# Patient Record
Sex: Female | Born: 1945 | Race: White | Hispanic: No | Smoking: Never smoker
Health system: Southern US, Community
[De-identification: ages and names within clinical notes are randomized; demographics above are authoritative.]

## PROBLEM LIST (undated history)

## (undated) DIAGNOSIS — E559 Vitamin D deficiency, unspecified: Secondary | ICD-10-CM

## (undated) DIAGNOSIS — K210 Gastro-esophageal reflux disease with esophagitis: Secondary | ICD-10-CM

## (undated) DIAGNOSIS — I471 Supraventricular tachycardia: Secondary | ICD-10-CM

## (undated) DIAGNOSIS — I10 Essential (primary) hypertension: Secondary | ICD-10-CM

## (undated) DIAGNOSIS — R7303 Prediabetes: Secondary | ICD-10-CM

## (undated) DIAGNOSIS — G43909 Migraine, unspecified, not intractable, without status migrainosus: Secondary | ICD-10-CM

## (undated) DIAGNOSIS — G518 Other disorders of facial nerve: Secondary | ICD-10-CM

## (undated) DIAGNOSIS — E782 Mixed hyperlipidemia: Secondary | ICD-10-CM

## (undated) HISTORY — DX: Mixed hyperlipidemia: E78.2

## (undated) HISTORY — DX: Migraine, unspecified, not intractable, without status migrainosus: G43.909

## (undated) HISTORY — DX: Gastro-esophageal reflux disease with esophagitis: K21.0

## (undated) HISTORY — DX: Other disorders of facial nerve: G51.8

## (undated) HISTORY — DX: Supraventricular tachycardia: I47.1

## (undated) HISTORY — DX: Vitamin D deficiency, unspecified: E55.9

## (undated) HISTORY — DX: Prediabetes: R73.03

## (undated) HISTORY — DX: Essential (primary) hypertension: I10

## (undated) HISTORY — PX: NO PAST SURGERIES: SHX2092

## (undated) HISTORY — DX: Morbid (severe) obesity due to excess calories: E66.01

---

## 2000-02-26 ENCOUNTER — Other Ambulatory Visit: Admission: RE | Admit: 2000-02-26 | Discharge: 2000-02-26 | Payer: Self-pay | Admitting: Obstetrics and Gynecology

## 2001-02-13 ENCOUNTER — Other Ambulatory Visit: Admission: RE | Admit: 2001-02-13 | Discharge: 2001-02-13 | Payer: Self-pay | Admitting: Obstetrics and Gynecology

## 2001-07-18 ENCOUNTER — Other Ambulatory Visit: Admission: RE | Admit: 2001-07-18 | Discharge: 2001-07-18 | Payer: Self-pay | Admitting: Gastroenterology

## 2001-07-18 ENCOUNTER — Encounter (INDEPENDENT_AMBULATORY_CARE_PROVIDER_SITE_OTHER): Payer: Self-pay | Admitting: Specialist

## 2002-04-08 ENCOUNTER — Other Ambulatory Visit: Admission: RE | Admit: 2002-04-08 | Discharge: 2002-04-08 | Payer: Self-pay | Admitting: Obstetrics and Gynecology

## 2003-05-21 ENCOUNTER — Other Ambulatory Visit: Admission: RE | Admit: 2003-05-21 | Discharge: 2003-05-21 | Payer: Self-pay | Admitting: Obstetrics and Gynecology

## 2004-06-29 ENCOUNTER — Other Ambulatory Visit: Admission: RE | Admit: 2004-06-29 | Discharge: 2004-06-29 | Payer: Self-pay | Admitting: Obstetrics and Gynecology

## 2005-07-05 ENCOUNTER — Other Ambulatory Visit: Admission: RE | Admit: 2005-07-05 | Discharge: 2005-07-05 | Payer: Self-pay | Admitting: Obstetrics and Gynecology

## 2006-07-10 ENCOUNTER — Other Ambulatory Visit: Admission: RE | Admit: 2006-07-10 | Discharge: 2006-07-10 | Payer: Self-pay | Admitting: Obstetrics and Gynecology

## 2007-05-30 ENCOUNTER — Ambulatory Visit: Payer: Self-pay | Admitting: Gastroenterology

## 2007-06-13 ENCOUNTER — Ambulatory Visit: Payer: Self-pay | Admitting: Gastroenterology

## 2009-08-30 ENCOUNTER — Encounter: Admission: RE | Admit: 2009-08-30 | Discharge: 2009-08-30 | Payer: Self-pay | Admitting: Obstetrics and Gynecology

## 2010-01-24 ENCOUNTER — Ambulatory Visit: Admission: RE | Admit: 2010-01-24 | Discharge: 2010-01-24 | Payer: Self-pay | Admitting: Internal Medicine

## 2010-06-23 ENCOUNTER — Encounter: Admission: RE | Admit: 2010-06-23 | Discharge: 2010-06-23 | Payer: Self-pay | Admitting: Internal Medicine

## 2010-08-18 ENCOUNTER — Encounter: Admission: RE | Admit: 2010-08-18 | Discharge: 2010-08-18 | Payer: Self-pay | Admitting: Obstetrics and Gynecology

## 2011-02-25 ENCOUNTER — Emergency Department (HOSPITAL_COMMUNITY)
Admission: EM | Admit: 2011-02-25 | Discharge: 2011-02-25 | Disposition: A | Discharge status: Home / Self Care | Payer: BC Managed Care – PPO | Attending: Emergency Medicine | Admitting: Emergency Medicine

## 2011-02-25 DIAGNOSIS — I498 Other specified cardiac arrhythmias: Secondary | ICD-10-CM | POA: Insufficient documentation

## 2011-02-25 DIAGNOSIS — Z79899 Other long term (current) drug therapy: Secondary | ICD-10-CM | POA: Insufficient documentation

## 2011-02-25 DIAGNOSIS — R002 Palpitations: Secondary | ICD-10-CM | POA: Insufficient documentation

## 2011-06-07 ENCOUNTER — Other Ambulatory Visit: Payer: Self-pay | Admitting: Internal Medicine

## 2011-06-07 DIAGNOSIS — Z1231 Encounter for screening mammogram for malignant neoplasm of breast: Secondary | ICD-10-CM

## 2011-07-05 ENCOUNTER — Ambulatory Visit: Payer: BC Managed Care – PPO

## 2011-07-10 ENCOUNTER — Ambulatory Visit
Admission: RE | Admit: 2011-07-10 | Discharge: 2011-07-10 | Disposition: A | Discharge status: Home / Self Care | Payer: Medicare Other | Source: Ambulatory Visit | Attending: Internal Medicine | Admitting: Internal Medicine

## 2011-07-10 DIAGNOSIS — Z1231 Encounter for screening mammogram for malignant neoplasm of breast: Secondary | ICD-10-CM

## 2012-09-25 ENCOUNTER — Other Ambulatory Visit: Payer: Self-pay | Admitting: Internal Medicine

## 2012-09-25 DIAGNOSIS — Z1231 Encounter for screening mammogram for malignant neoplasm of breast: Secondary | ICD-10-CM

## 2012-11-07 ENCOUNTER — Ambulatory Visit
Admission: RE | Admit: 2012-11-07 | Discharge: 2012-11-07 | Disposition: A | Discharge status: Home / Self Care | Payer: Medicare Other | Source: Ambulatory Visit | Attending: Internal Medicine | Admitting: Internal Medicine

## 2012-11-07 DIAGNOSIS — Z1231 Encounter for screening mammogram for malignant neoplasm of breast: Secondary | ICD-10-CM

## 2013-10-17 ENCOUNTER — Encounter: Payer: Self-pay | Admitting: Internal Medicine

## 2013-10-20 ENCOUNTER — Ambulatory Visit (INDEPENDENT_AMBULATORY_CARE_PROVIDER_SITE_OTHER): Payer: Medicare Other | Admitting: Internal Medicine

## 2013-10-20 ENCOUNTER — Encounter: Payer: Self-pay | Admitting: Internal Medicine

## 2013-10-20 VITALS — BP 134/80 | HR 64 | Temp 98.2°F | Resp 18 | Ht 61.0 in | Wt 148.2 lb

## 2013-10-20 DIAGNOSIS — G518 Other disorders of facial nerve: Secondary | ICD-10-CM

## 2013-10-20 DIAGNOSIS — I10 Essential (primary) hypertension: Secondary | ICD-10-CM

## 2013-10-20 DIAGNOSIS — G43909 Migraine, unspecified, not intractable, without status migrainosus: Secondary | ICD-10-CM

## 2013-10-20 DIAGNOSIS — E559 Vitamin D deficiency, unspecified: Secondary | ICD-10-CM

## 2013-10-20 DIAGNOSIS — K2100 Gastro-esophageal reflux disease with esophagitis, without bleeding: Secondary | ICD-10-CM

## 2013-10-20 DIAGNOSIS — E782 Mixed hyperlipidemia: Secondary | ICD-10-CM

## 2013-10-20 DIAGNOSIS — R7303 Prediabetes: Secondary | ICD-10-CM

## 2013-10-20 DIAGNOSIS — R7309 Other abnormal glucose: Secondary | ICD-10-CM

## 2013-10-20 DIAGNOSIS — Z79899 Other long term (current) drug therapy: Secondary | ICD-10-CM

## 2013-10-20 HISTORY — DX: Mixed hyperlipidemia: E78.2

## 2013-10-20 HISTORY — DX: Prediabetes: R73.03

## 2013-10-20 HISTORY — DX: Migraine, unspecified, not intractable, without status migrainosus: G43.909

## 2013-10-20 HISTORY — DX: Essential (primary) hypertension: I10

## 2013-10-20 HISTORY — DX: Gastro-esophageal reflux disease with esophagitis, without bleeding: K21.00

## 2013-10-20 HISTORY — DX: Other disorders of facial nerve: G51.8

## 2013-10-20 HISTORY — DX: Vitamin D deficiency, unspecified: E55.9

## 2013-10-20 LAB — HEPATIC FUNCTION PANEL
AST: 26 U/L (ref 0–37)
Albumin: 4.5 g/dL (ref 3.5–5.2)
Alkaline Phosphatase: 93 U/L (ref 39–117)
Bilirubin, Direct: 0.1 mg/dL (ref 0.0–0.3)
Total Bilirubin: 0.3 mg/dL (ref 0.3–1.2)

## 2013-10-20 LAB — CBC WITH DIFFERENTIAL/PLATELET
Basophils Absolute: 0.1 10*3/uL (ref 0.0–0.1)
Lymphocytes Relative: 22 % (ref 12–46)
Lymphs Abs: 1.1 10*3/uL (ref 0.7–4.0)
Neutrophils Relative %: 65 % (ref 43–77)
Platelets: 328 10*3/uL (ref 150–400)
RBC: 5.08 MIL/uL (ref 3.87–5.11)
RDW: 13.9 % (ref 11.5–15.5)
WBC: 4.9 10*3/uL (ref 4.0–10.5)

## 2013-10-20 LAB — LIPID PANEL
HDL: 77 mg/dL (ref >39)
Total CHOL/HDL Ratio: 2.6 Ratio
Triglycerides: 86 mg/dL (ref <150)

## 2013-10-20 LAB — BASIC METABOLIC PANEL WITH GFR
CO2: 27 mEq/L (ref 19–32)
Calcium: 10 mg/dL (ref 8.4–10.5)
Chloride: 106 mEq/L (ref 96–112)
Creat: 0.94 mg/dL (ref 0.50–1.10)
GFR, Est Non African American: 63 mL/min
Sodium: 140 mEq/L (ref 135–145)

## 2013-10-20 LAB — TSH: TSH: 1.926 u[IU]/mL (ref 0.350–4.500)

## 2013-10-20 LAB — MAGNESIUM: Magnesium: 2 mg/dL (ref 1.5–2.5)

## 2013-10-20 NOTE — Progress Notes (Signed)
Patient ID: Jean Drake, female   DOB: November 19, 1945, 67 y.o.   MRN: [id number]  This very nice 67 yo MWF presents for 3 month follow up with labile HTN and palpitations, Hyperlipidemia, Pre-Diabetes and Vitamin D Deficiency.    BP has been controlled at home. Today's BP is 134/80. Patient denies any cardiac type chest pain,  dyspnea/orthopnea/PND, dizziness, claudication, or dependent edema, but does have occasional transient fluttering palpitations,.   Hyperlipidemia is controlled with diet & meds. Last Cholesterol was 216, Triglycerides were 141, HDL 66 and LDL 122. Patient denies myalgias or other med SE's.    Also, the patient has history of PreDiabetes/insulin resistance with last A1c of    . Patient denies any symptoms of reactive hypoglycemia, diabetic polys, paresthesias or visual blurring.   Further, Patient has history of Vitamin D Deficiency with last vitamin D of   . Patient supplements vitamin D without any suspected side-effects.    Medication List       ALLERGY MEDICINE PO  Take 180 mg by mouth daily.     ALPRAZolam 0.5 MG tablet  Commonly known as:  XANAX  Take 0.5 mg by mouth. Takes 1/2 tab prn     amitriptyline 10 MG tablet  Commonly known as:  ELAVIL  Take 10 mg by mouth at bedtime.     aspirin 81 MG tablet  Take 81 mg by mouth daily.     atenolol 25 MG tablet  Commonly known as:  TENORMIN  Take 25 mg by mouth daily.     estradiol 0.5 MG tablet  Commonly known as:  ESTRACE  Take 0.5 mg by mouth daily.     FLAXSEED OIL PO  Take 1,400 mg by mouth daily.     MAGNESIUM OXIDE PO  Take 250 mg by mouth daily.     multivitamin tablet  Take 1 tablet by mouth daily.     multivitamin-lutein Caps capsule  Take 1 capsule by mouth daily.     omeprazole 40 MG capsule  Commonly known as:  PRILOSEC  Take 40 mg by mouth daily.     Vitamin D 2000 UNITS tablet  Take 2,000 Units by mouth daily. Takes 3 per day     vitamin E 400 UNIT capsule  Take 400 Units by mouth  daily.           Allergies  Allergen Reactions  . Codeine   . Penicillins   . Sudafed [Pseudoephedrine Hcl]     PMHx:  Significant as above  FHx:    Reviewed / unchanged  SHx:    Reviewed / unchanged  Systems Review: Constitutional: Denies fever, chills, wt changes, headaches, insomnia, fatigue, night sweats, change in appetite. Eyes: Denies redness, blurred vision, diplopia, discharge, itchy, watery eyes.  ENT: Denies discharge, congestion, post nasal drip, epistaxis, sore throat, earache, hearing loss, dental pain, tinnitus, vertigo, sinus pain, snoring.  CV: Denies chest pain, palpitations, irregular heartbeat, syncope, dyspnea, diaphoresis, orthopnea, PND, claudication, edema. Respiratory: denies cough, dyspnea, DOE, pleurisy, hoarseness, laryngitis, wheezing.  Gastrointestinal: Denies dysphagia, odynophagia, heartburn, reflux, water brash, abdominal pain or cramps, nausea, vomiting, bloating, diarrhea, constipation, hematemesis, melena, hematochezia,  or hemorrhoids. Genitourinary: Denies dysuria, frequency, urgency, nocturia, hesitancy, discharge, hematuria, flank pain. Musculoskeletal: Denies arthralgias, myalgias, stiffness, jt. swelling, pain, limp, strain/sprain.  Skin: Denies pruritus, rash, hives, warts, acne, eczema, change in skin lesion(s). Neuro: No weakness, tremor, incoordination, spasms, paresthesia, or pain. Psychiatric: Denies confusion, memory loss, or sensory loss. Endo: Denies change in  weight, skin, hair change.  Heme/Lymph: No excessive bleeding, bruising, orenlarged lymph nodes.  Filed Vitals:   10/20/13 1127  BP: 134/80  Pulse: 64  Temp: 98.2 F (36.8 C)  Resp: 18    Estimated body mass index is 28.02 kg/(m^2) as calculated from the following:   Height as of this encounter: 5\' 1"  (1.549 m).   Weight as of this encounter: 148 lb 3.2 oz (67.223 kg).  On Exam: Appears well nourished - in no distress. Eyes: PERRLA, EOMs, conjunctiva no  swelling or erythema. Sinuses: No frontal/maxillary tenderness ENT/Mouth: EAC's clear, TM's nl w/o erythema, bulging. Nares clear w/o erythema, swelling, exudates. Oropharynx clear without erythema or exudates. Oral hygiene is good. Tongue normal, non obstructing. Hearing intact.  Neck: Supple. Thyroid nl. Car 2+/2+ without bruits, nodes or JVD. Chest: Respirations nl with BS clear & equal w/o rales, rhonchi, wheezing or stridor.  Cor: Heart sounds normal w/ regular rate and rhythm without sig. murmurs, gallops, clicks, or rubs. Peripheral pulses normal and equal  without edema.  Abdomen: Soft & bowel sounds normal. Non-tender w/o guarding, rebound, hernias, masses, or organomegaly.  Lymphatics: Unremarkable.  Musculoskeletal: Full ROM all peripheral extremities, joint stability, 5/5 strength, and normal gait.  Skin: Warm, dry without exposed rashes, lesions, ecchymosis apparent.  Neuro: Cranial nerves intact, reflexes equal bilaterally. Sensory-motor testing grossly intact. Tendon reflexes grossly intact.  Pysch: Alert & oriented x 3. Insight and judgement nl & appropriate. No ideations.  Assessment and Plan:  1. Hypertension - Continue monitor blood pressure at home. Continue diet/meds same.  2. Hyperlipidemia - Continue diet/meds, exercise,& lifestyle modifications. Continue monitor periodic cholesterol/liver & renal functions   3. Pre-diabetes- Continue diet, exercise, lifestyle modifications. Monitor appropriate labs.  4. Vitamin D Deficiency - Continue supplementation.  Recommended regular exercise, BP monitoring, weight control, and discussed med and SE's. Recommended labs to assess and monitor clinical status. Further disposition pending results of labs.

## 2013-10-20 NOTE — Patient Instructions (Signed)
Hypertension As your heart beats, it forces blood through your arteries. This force is your blood pressure. If the pressure is too high, it is called hypertension (HTN) or high blood pressure. HTN is dangerous because you may have it and not know it. High blood pressure may mean that your heart has to work harder to pump blood. Your arteries may be narrow or stiff. The extra work puts you at risk for heart disease, stroke, and other problems.  Blood pressure consists of two numbers, a higher number over a lower, 110/72, for example. It is stated as "110 over 72." The ideal is below 120 for the top number (systolic) and under 80 for the bottom (diastolic). Write down your blood pressure today. You should pay close attention to your blood pressure if you have certain conditions such as:  Heart failure.  Prior heart attack.  Diabetes  Chronic kidney disease.  Prior stroke.  Multiple risk factors for heart disease. To see if you have HTN, your blood pressure should be measured while you are seated with your arm held at the level of the heart. It should be measured at least twice. A one-time elevated blood pressure reading (especially in the Emergency Department) does not mean that you need treatment. There may be conditions in which the blood pressure is different between your right and left arms. It is important to see your caregiver soon for a recheck. Most people have essential hypertension which means that there is not a specific cause. This type of high blood pressure may be lowered by changing lifestyle factors such as:  Stress.  Smoking.  Lack of exercise.  Excessive weight.  Drug/tobacco/alcohol use.  Eating less salt. Most people do not have symptoms from high blood pressure until it has caused damage to the body. Effective treatment can often prevent, delay or reduce that damage. TREATMENT  When a cause has been identified, treatment for high blood pressure is directed at the  cause. There are a large number of medications to treat HTN. These fall into several categories, and your caregiver will help you select the medicines that are best for you. Medications may have side effects. You should review side effects with your caregiver. If your blood pressure stays high after you have made lifestyle changes or started on medicines,   Your medication(s) may need to be changed.  Other problems may need to be addressed.  Be certain you understand your prescriptions, and know how and when to take your medicine.  Be sure to follow up with your caregiver within the time frame advised (usually within two weeks) to have your blood pressure rechecked and to review your medications.  If you are taking more than one medicine to lower your blood pressure, make sure you know how and at what times they should be taken. Taking two medicines at the same time can result in blood pressure that is too low. SEEK IMMEDIATE MEDICAL CARE IF:  You develop a severe headache, blurred or changing vision, or confusion.  You have unusual weakness or numbness, or a faint feeling.  You have severe chest or abdominal pain, vomiting, or breathing problems. MAKE SURE YOU:   Understand these instructions.  Will watch your condition.  Will get help right away if you are not doing well or get worse. Document Released: 10/29/2005 Document Revised: 01/21/2012 Document Reviewed: 06/18/2008 [employer] Patient Information 2014 [employer].  Diabetes and Exercise Exercising regularly is important. It is not just about losing weight. It   has many health benefits, such as:  Improving your overall fitness, flexibility, and endurance.  Increasing your bone density.  Helping with weight control.  Decreasing your body fat.  Increasing your muscle strength.  Reducing stress and tension.  Improving your overall health. People with diabetes who exercise gain additional benefits because  exercise:  Reduces appetite.  Improves the body's use of blood sugar (glucose).  Helps lower or control blood glucose.  Decreases blood pressure.  Helps control blood lipids (such as cholesterol and triglycerides).  Improves the body's use of the hormone insulin by:  Increasing the body's insulin sensitivity.  Reducing the body's insulin needs.  Decreases the risk for heart disease because exercising:  Lowers cholesterol and triglycerides levels.  Increases the levels of good cholesterol (such as high-density lipoproteins [HDL]) in the body.  Lowers blood glucose levels. YOUR ACTIVITY PLAN  Choose an activity that you enjoy and set realistic goals. Your health care provider or diabetes educator can help you make an activity plan that works for you. You can break activities into 2 or 3 sessions throughout the day. Doing so is as good as one long session. Exercise ideas include:  Taking the dog for a walk.  Taking the stairs instead of the elevator.  Dancing to your favorite song.  Doing your favorite exercise with a friend. RECOMMENDATIONS FOR EXERCISING WITH TYPE 1 OR TYPE 2 DIABETES   Check your blood glucose before exercising. If blood glucose levels are greater than 240 mg/dL, check for urine ketones. Do not exercise if ketones are present.  Avoid injecting insulin into areas of the body that are going to be exercised. For example, avoid injecting insulin into:  The arms when playing tennis.  The legs when jogging.  Keep a record of:  Food intake before and after you exercise.  Expected peak times of insulin action.  Blood glucose levels before and after you exercise.  The type and amount of exercise you have done.  Review your records with your health care provider. Your health care provider will help you to develop guidelines for adjusting food intake and insulin amounts before and after exercising.  If you take insulin or oral hypoglycemic agents, watch  for signs and symptoms of hypoglycemia. They include:  Dizziness.  Shaking.  Sweating.  Chills.  Confusion.  Drink plenty of water while you exercise to prevent dehydration or heat stroke. Body water is lost during exercise and must be replaced.  Talk to your health care provider before starting an exercise program to make sure it is safe for you. Remember, almost any type of activity is better than none. Document Released: 01/19/2004 Document Revised: 07/01/2013 Document Reviewed: 04/07/2013 [employer] Patient Information 2014 [employer].  Cholesterol Cholesterol is a white, waxy, fat-like protein needed by your body in small amounts. The liver makes all the cholesterol you need. It is carried from the liver by the blood through the blood vessels. Deposits (plaque) may build up on blood vessel walls. This makes the arteries narrower and stiffer. Plaque increases the risk for heart attack and stroke. You cannot feel your cholesterol level even if it is very high. The only way to know is by a blood test to check your lipid (fats) levels. Once you know your cholesterol levels, you should keep a record of the test results. Work with your caregiver to to keep your levels in the desired range. WHAT THE RESULTS MEAN:  Total cholesterol is a rough measure of all the cholesterol   in your blood.  LDL is the so-called bad cholesterol. This is the type that deposits cholesterol in the walls of the arteries. You want this level to be low.  HDL is the good cholesterol because it cleans the arteries and carries the LDL away. You want this level to be high.  Triglycerides are fat that the body can either burn for energy or store. High levels are closely linked to heart disease. DESIRED LEVELS:  Total cholesterol below 200.  LDL below 100 for people at risk, below 70 for very high risk.  HDL above 50 is good, above 60 is best.  Triglycerides below 150. HOW TO LOWER YOUR  CHOLESTEROL:  Diet.  Choose fish or white meat chicken and turkey, roasted or baked. Limit fatty cuts of red meat, fried foods, and processed meats, such as sausage and lunch meat.  Eat lots of fresh fruits and vegetables. Choose whole grains, beans, pasta, potatoes and cereals.  Use only small amounts of olive, corn or canola oils. Avoid butter, mayonnaise, shortening or palm kernel oils. Avoid foods with trans-fats.  Use skim/nonfat milk and low-fat/nonfat yogurt and cheeses. Avoid whole milk, cream, ice cream, egg yolks and cheeses. Healthy desserts include angel food cake, ginger snaps, animal crackers, hard candy, popsicles, and low-fat/nonfat frozen yogurt. Avoid pastries, cakes, pies and cookies.  Exercise.  A regular program helps decrease LDL and raises HDL.  Helps with weight control.  Do things that increase your activity level like gardening, walking, or taking the stairs.  Medication.  May be prescribed by your caregiver to help lowering cholesterol and the risk for heart disease.  You may need medicine even if your levels are normal if you have several risk factors. HOME CARE INSTRUCTIONS   Follow your diet and exercise programs as suggested by your caregiver.  Take medications as directed.  Have blood work done when your caregiver feels it is necessary. MAKE SURE YOU:   Understand these instructions.  Will watch your condition.  Will get help right away if you are not doing well or get worse. Document Released: 07/24/2001 Document Revised: 01/21/2012 Document Reviewed: 01/14/2008 [employer] Patient Information 2014 [employer].  Vitamin D Deficiency Vitamin D is an important vitamin that your body needs. Having too little of it in your body is called a deficiency. A very bad deficiency can make your bones soft and can cause a condition called rickets.  Vitamin D is important to your body for different reasons, such as:   It helps your body absorb 2  minerals called calcium and phosphorus.  It helps make your bones healthy.  It may prevent some diseases, such as diabetes and multiple sclerosis.  It helps your muscles and heart. You can get vitamin D in several ways. It is a natural part of some foods. The vitamin is also added to some dairy products and cereals. Some people take vitamin D supplements. Also, your body makes vitamin D when you are in the sun. It changes the sun's rays into a form of the vitamin that your body can use. CAUSES   Not eating enough foods that contain vitamin D.  Not getting enough sunlight.  Having certain digestive system diseases that make it hard to absorb vitamin D. These diseases include Crohn's disease, chronic pancreatitis, and cystic fibrosis.  Having a surgery in which part of the stomach or small intestine is removed.  Being obese. Fat cells pull vitamin D out of your blood. That means that obese people   may not have enough vitamin D left in their blood and in other body tissues.  Having chronic kidney or liver disease. RISK FACTORS Risk factors are things that make you more likely to develop a vitamin D deficiency. They include:  Being older.  Not being able to get outside very much.  Living in a nursing home.  Having had broken bones.  Having weak or thin bones (osteoporosis).  Having a disease or condition that changes how your body absorbs vitamin D.  Having dark skin.  Some medicines such as seizure medicines or steroids.  Being overweight or obese. SYMPTOMS Mild cases of vitamin D deficiency may not have any symptoms. If you have a very bad case, symptoms may include:  Bone pain.  Muscle pain.  Falling often.  Broken bones caused by a minor injury, due to osteoporosis. DIAGNOSIS A blood test is the best way to tell if you have a vitamin D deficiency. TREATMENT Vitamin D deficiency can be treated in different ways. Treatment for vitamin D deficiency depends on what is  causing it. Options include:  Taking vitamin D supplements.  Taking a calcium supplement. Your caregiver will suggest what dose is best for you. HOME CARE INSTRUCTIONS  Take any supplements that your caregiver prescribes. Follow the directions carefully. Take only the suggested amount.  Have your blood tested 2 months after you start taking supplements.  Eat foods that contain vitamin D. Healthy choices include:  Fortified dairy products, cereals, or juices. Fortified means vitamin D has been added to the food. Check the label on the package to be sure.  Fatty fish like salmon or trout.  Eggs.  Oysters.  Do not use a tanning bed.  Keep your weight at a healthy level. Lose weight if you need to.  Keep all follow-up appointments. Your caregiver will need to perform blood tests to make sure your vitamin D deficiency is going away. SEEK MEDICAL CARE IF:  You have any questions about your treatment.  You continue to have symptoms of vitamin D deficiency.  You have nausea or vomiting.  You are constipated.  You feel confused.  You have severe abdominal or back pain. MAKE SURE YOU:  Understand these instructions.  Will watch your condition.  Will get help right away if you are not doing well or get worse. Document Released: 01/21/2012 Document Revised: 02/23/2013 Document Reviewed: 01/21/2012 [employer] Patient Information 2014 [employer].  

## 2013-10-21 ENCOUNTER — Telehealth: Payer: Self-pay | Admitting: *Deleted

## 2013-10-21 LAB — INSULIN, FASTING: Insulin fasting, serum: 4 u[IU]/mL (ref 3–28)

## 2013-10-21 LAB — VITAMIN D 25 HYDROXY (VIT D DEFICIENCY, FRACTURES): Vit D, 25-Hydroxy: 89 ng/mL (ref 30–89)

## 2013-10-22 NOTE — Telephone Encounter (Signed)
Pt aware of lab results 

## 2013-11-02 ENCOUNTER — Encounter: Payer: Self-pay | Admitting: Physician Assistant

## 2013-11-02 ENCOUNTER — Ambulatory Visit (INDEPENDENT_AMBULATORY_CARE_PROVIDER_SITE_OTHER): Payer: Medicare Other | Admitting: Physician Assistant

## 2013-11-02 VITALS — BP 132/78 | HR 78 | Temp 98.3°F | Resp 16 | Ht 61.0 in | Wt 144.0 lb

## 2013-11-02 DIAGNOSIS — J069 Acute upper respiratory infection, unspecified: Secondary | ICD-10-CM

## 2013-11-02 MED ORDER — ALPRAZOLAM 0.5 MG PO TABS: ORAL_TABLET | ORAL | Status: DC

## 2013-11-02 MED ORDER — AZITHROMYCIN 250 MG PO TABS: ORAL_TABLET | ORAL | Status: DC

## 2013-11-02 MED ORDER — HYDROCODONE-ACETAMINOPHEN 5-325 MG PO TABS: 1.0000 | ORAL_TABLET | Freq: Four times a day (QID) | ORAL | Status: DC | PRN

## 2013-11-02 MED ORDER — PREDNISONE 20 MG PO TABS: ORAL_TABLET | ORAL | Status: DC

## 2013-11-02 NOTE — Progress Notes (Signed)
   Subjective:    Patient ID: Jean Drake, female    DOB: [birth year], 67 y.o.   MRN: [id number]  URI  This is a new problem. Episode onset: Thrusday evening. The problem has been gradually worsening. There has been no fever. Associated symptoms include congestion, coughing, a plugged ear sensation, sinus pain and wheezing. Pertinent negatives include no abdominal pain, chest pain, diarrhea, dysuria, ear pain, headaches, joint pain, joint swelling, nausea, neck pain, rash, rhinorrhea, sneezing, sore throat, swollen glands or vomiting. Treatments tried: Prednisone has taken 3. The treatment provided mild relief.    Review of Systems  Constitutional: Negative.   HENT: Positive for congestion. Negative for ear pain, rhinorrhea, sneezing and sore throat.   Eyes: Negative.   Respiratory: Positive for cough and wheezing.   Cardiovascular: Negative.  Negative for chest pain.  Gastrointestinal: Negative for nausea, vomiting, abdominal pain and diarrhea.  Genitourinary: Negative.  Negative for dysuria.  Musculoskeletal: Negative.  Negative for joint pain and neck pain.  Skin: Negative for rash.  Neurological: Negative.  Negative for headaches.       Objective:   Physical Exam  Constitutional: She is oriented to person, place, and time. She appears well-developed and well-nourished.  HENT:  Head: Normocephalic and atraumatic.  Right Ear: External ear normal.  Left Ear: External ear normal.  Nose: Nose normal.  Mouth/Throat: Oropharynx is clear and moist.  Eyes: Conjunctivae are normal. Pupils are equal, round, and reactive to light.  Neck: Normal range of motion. Neck supple.  Cardiovascular: Normal rate and regular rhythm.   Pulmonary/Chest: Effort normal. No respiratory distress. She has wheezes. She has no rales. She exhibits no tenderness.  Abdominal: Soft. Bowel sounds are normal.  Lymphadenopathy:    She has cervical adenopathy.  Neurological: She is alert and oriented to person,  place, and time.  Skin: Skin is warm and dry.      Assessment & Plan:  Upper respiratory tract infection - Plan: azithromycin (ZITHROMAX) 250 MG tablet, predniSONE (DELTASONE) 20 MG tablet, HYDROcodone-acetaminophen (NORCO) 5-325 MG per tablet

## 2013-11-02 NOTE — Patient Instructions (Signed)

## 2013-12-07 ENCOUNTER — Other Ambulatory Visit: Payer: Self-pay | Admitting: Internal Medicine

## 2013-12-07 MED ORDER — ESTRADIOL 0.5 MG PO TABS: 0.5000 mg | ORAL_TABLET | Freq: Every day | ORAL | Status: DC

## 2013-12-18 ENCOUNTER — Encounter: Payer: Self-pay | Admitting: Physician Assistant

## 2013-12-18 ENCOUNTER — Ambulatory Visit (INDEPENDENT_AMBULATORY_CARE_PROVIDER_SITE_OTHER): Payer: Medicare Other | Admitting: Physician Assistant

## 2013-12-18 VITALS — BP 120/70 | HR 64 | Temp 97.9°F | Resp 16 | Ht 61.0 in | Wt 147.0 lb

## 2013-12-18 DIAGNOSIS — J029 Acute pharyngitis, unspecified: Secondary | ICD-10-CM

## 2013-12-18 MED ORDER — AZITHROMYCIN 250 MG PO TABS: ORAL_TABLET | ORAL | Status: AC

## 2013-12-18 MED ORDER — ACYCLOVIR 400 MG PO TABS
ORAL_TABLET | ORAL | Status: AC
Start: 2013-12-18 — End: 2013-12-23

## 2013-12-18 NOTE — Patient Instructions (Signed)
Pharyngitis °Pharyngitis is redness, pain, and swelling (inflammation) of your pharynx.  °CAUSES  °Pharyngitis is usually caused by infection. Most of the time, these infections are from viruses (viral) and are part of a cold. However, sometimes pharyngitis is caused by bacteria (bacterial). Pharyngitis can also be caused by allergies. Viral pharyngitis may be spread from person to person by coughing, sneezing, and personal items or utensils (cups, forks, spoons, toothbrushes). Bacterial pharyngitis may be spread from person to person by more intimate contact, such as kissing.  °SIGNS AND SYMPTOMS  °Symptoms of pharyngitis include:   °· Sore throat.   °· Tiredness (fatigue).   °· Low-grade fever.   °· Headache. °· Joint pain and muscle aches. °· Skin rashes. °· Swollen lymph nodes. °· Plaque-like film on throat or tonsils (often seen with bacterial pharyngitis). °DIAGNOSIS  °Your health care provider will ask you questions about your illness and your symptoms. Your medical history, along with a physical exam, is often all that is needed to diagnose pharyngitis. Sometimes, a rapid strep test is done. Other lab tests may also be done, depending on the suspected cause.  °TREATMENT  °Viral pharyngitis will usually get better in 3 4 days without the use of medicine. Bacterial pharyngitis is treated with medicines that kill germs (antibiotics).  °HOME CARE INSTRUCTIONS  °· Drink enough water and fluids to keep your urine clear or pale yellow.   °· Only take over-the-counter or prescription medicines as directed by your health care provider:   °· If you are prescribed antibiotics, make sure you finish them even if you start to feel better.   °· Do not take aspirin.   °· Get lots of rest.   °· Gargle with 8 oz of salt water (½ tsp of salt per 1 qt of water) as often as every 1 2 hours to soothe your throat.   °· Throat lozenges (if you are not at risk for choking) or sprays may be used to soothe your throat. °SEEK MEDICAL  CARE IF:  °· You have large, tender lumps in your neck. °· You have a rash. °· You cough up green, yellow-brown, or bloody spit. °SEEK IMMEDIATE MEDICAL CARE IF:  °· Your neck becomes stiff. °· You drool or are unable to swallow liquids. °· You vomit or are unable to keep medicines or liquids down. °· You have severe pain that does not go away with the use of recommended medicines. °· You have trouble breathing (not caused by a stuffy nose). °MAKE SURE YOU:  °· Understand these instructions. °· Will watch your condition. °· Will get help right away if you are not doing well or get worse. °Document Released: 10/29/2005 Document Revised: 08/19/2013 Document Reviewed: 07/06/2013 °[employer]® Patient Information ©2014 [employer]. ° °

## 2013-12-18 NOTE — Progress Notes (Signed)
   Subjective:    Patient ID: Andreas NewportAnn L Eisman, female    DOB: 07/04/1946, 68 y.o.   MRN: [id number]  Sore Throat  This is a new problem. Episode onset: one week. The problem has been gradually improving. There has been no fever. The pain is mild. Associated symptoms include coughing. Pertinent negatives include no congestion, diarrhea, drooling, ear discharge, ear pain, headaches, hoarse voice, plugged ear sensation, neck pain, shortness of breath, stridor, swollen glands, trouble swallowing or vomiting. She has tried gargles for the symptoms. The treatment provided mild relief.    Review of Systems  Constitutional: Negative.   HENT: Positive for sore throat. Negative for congestion, drooling, ear discharge, ear pain, hoarse voice, nosebleeds, postnasal drip, rhinorrhea, sinus pressure, tinnitus and trouble swallowing.   Eyes: Negative.   Respiratory: Positive for cough. Negative for shortness of breath and stridor.   Cardiovascular: Negative.   Gastrointestinal: Negative.  Negative for vomiting and diarrhea.  Genitourinary: Negative.   Musculoskeletal: Negative.  Negative for neck pain.  Neurological: Negative.  Negative for headaches.       Objective:   Physical Exam  Constitutional: She appears well-developed and well-nourished.  HENT:  Head: Normocephalic and atraumatic.  Right Ear: External ear normal.  Left Ear: External ear normal.  Mouth/Throat: Uvula is midline and mucous membranes are normal. Posterior oropharyngeal erythema present. No oropharyngeal exudate, posterior oropharyngeal edema or tonsillar abscesses.    Eyes: Conjunctivae and EOM are normal. Pupils are equal, round, and reactive to light.  Neck: Normal range of motion. Neck supple.  Cardiovascular: Normal rate, regular rhythm and normal heart sounds.   Pulmonary/Chest: Effort normal and breath sounds normal.  Abdominal: Soft. Bowel sounds are normal.  Lymphadenopathy:    She has no cervical adenopathy.  Skin:  Skin is warm and dry.      Assessment & Plan:  Acute pharyngitis - Plan: acyclovir (ZOVIRAX) 400 MG tablet, azithromycin (ZITHROMAX) 250 MG tablet

## 2014-01-26 ENCOUNTER — Other Ambulatory Visit: Payer: Self-pay

## 2014-01-26 DIAGNOSIS — Z1231 Encounter for screening mammogram for malignant neoplasm of breast: Secondary | ICD-10-CM

## 2014-02-12 ENCOUNTER — Ambulatory Visit
Admission: RE | Admit: 2014-02-12 | Discharge: 2014-02-12 | Disposition: A | Discharge status: Home / Self Care | Payer: Medicare Other | Source: Ambulatory Visit

## 2014-02-12 DIAGNOSIS — Z1231 Encounter for screening mammogram for malignant neoplasm of breast: Secondary | ICD-10-CM

## 2014-03-30 ENCOUNTER — Other Ambulatory Visit: Payer: Self-pay | Admitting: *Deleted

## 2014-03-30 MED ORDER — AMITRIPTYLINE HCL 10 MG PO TABS: 10.0000 mg | ORAL_TABLET | Freq: Every day | ORAL | Status: DC

## 2014-04-20 ENCOUNTER — Encounter: Payer: Self-pay | Admitting: Internal Medicine

## 2014-04-20 ENCOUNTER — Ambulatory Visit (INDEPENDENT_AMBULATORY_CARE_PROVIDER_SITE_OTHER): Payer: Medicare Other | Admitting: Internal Medicine

## 2014-04-20 VITALS — BP 138/78 | HR 78 | Temp 98.4°F | Resp 16 | Ht 61.0 in | Wt 144.6 lb

## 2014-04-20 DIAGNOSIS — Z79899 Other long term (current) drug therapy: Secondary | ICD-10-CM | POA: Insufficient documentation

## 2014-04-20 DIAGNOSIS — J0410 Acute tracheitis without obstruction: Secondary | ICD-10-CM

## 2014-04-20 DIAGNOSIS — R7309 Other abnormal glucose: Secondary | ICD-10-CM

## 2014-04-20 DIAGNOSIS — Z789 Other specified health status: Secondary | ICD-10-CM

## 2014-04-20 DIAGNOSIS — Z0000 Encounter for general adult medical examination without abnormal findings: Secondary | ICD-10-CM | POA: Insufficient documentation

## 2014-04-20 DIAGNOSIS — Z136 Encounter for screening for cardiovascular disorders: Secondary | ICD-10-CM

## 2014-04-20 DIAGNOSIS — I10 Essential (primary) hypertension: Secondary | ICD-10-CM

## 2014-04-20 DIAGNOSIS — Z1212 Encounter for screening for malignant neoplasm of rectum: Secondary | ICD-10-CM

## 2014-04-20 DIAGNOSIS — E559 Vitamin D deficiency, unspecified: Secondary | ICD-10-CM

## 2014-04-20 DIAGNOSIS — Z1331 Encounter for screening for depression: Secondary | ICD-10-CM

## 2014-04-20 DIAGNOSIS — E782 Mixed hyperlipidemia: Secondary | ICD-10-CM

## 2014-04-20 LAB — CBC WITH DIFFERENTIAL/PLATELET
BASOS ABS: 0.1 10*3/uL (ref 0.0–0.1)
Basophils Relative: 1 % (ref 0–1)
EOS ABS: 0.2 10*3/uL (ref 0.0–0.7)
Eosinophils Relative: 3 % (ref 0–5)
HCT: 42.6 % (ref 36.0–46.0)
Hemoglobin: 14.3 g/dL (ref 12.0–15.0)
Lymphocytes Relative: 18 % (ref 12–46)
Lymphs Abs: 1.2 10*3/uL (ref 0.7–4.0)
MCH: 28.9 pg (ref 26.0–34.0)
MCHC: 33.6 g/dL (ref 30.0–36.0)
MCV: 86.2 fL (ref 78.0–100.0)
Monocytes Absolute: 0.5 10*3/uL (ref 0.1–1.0)
Monocytes Relative: 8 % (ref 3–12)
Neutro Abs: 4.6 10*3/uL (ref 1.7–7.7)
Neutrophils Relative %: 70 % (ref 43–77)
PLATELETS: 220 10*3/uL (ref 150–400)
RBC: 4.94 MIL/uL (ref 3.87–5.11)
RDW: 13.7 % (ref 11.5–15.5)
WBC: 6.5 10*3/uL (ref 4.0–10.5)

## 2014-04-20 MED ORDER — PROMETHAZINE-DM 6.25-15 MG/5ML PO SYRP: ORAL_SOLUTION | ORAL | Status: DC

## 2014-04-20 MED ORDER — PREDNISONE 20 MG PO TABS: ORAL_TABLET | ORAL | Status: DC

## 2014-04-20 MED ORDER — AZITHROMYCIN 250 MG PO TABS: ORAL_TABLET | ORAL | Status: DC

## 2014-04-20 NOTE — Patient Instructions (Signed)

## 2014-04-20 NOTE — Progress Notes (Signed)
Patient ID: Jean Drake, female   DOB: 09/22/1946, 68 y.o.   MRN: [id number]   Annual Screening Comprehensive Examination  This very nice 68 y.o.MWF presents for complete physical.  Patient has been followed for HTN, Prediabetes, Hyperlipidemia, and Vitamin D Deficiency.    Labile HTN predates several years and is monitored expectantly. Patient's BP has been controlled and today's BP: 138/78 mmHg. Patient denies any cardiac symptoms as chest pain, palpitations, shortness of breath, dizziness or ankle swelling.   Patient's hyperlipidemia is near goal with diet in Dec 53646 as below. Patient denies myalgias / medication SE's. Lab Results  Component Value Date   CHOL 204* 10/20/2013   HDL 77 10/20/2013   LDLCALC 110* 10/20/2013   TRIG 86 10/20/2013   CHOLHDL 2.6 10/20/2013    Patient is monitored for prediabetes and insulin resistance and last A1c was 5.7% in Dec 2014. Patient denies reactive hypoglycemic symptoms, visual blurring, diabetic polys, or paresthesias.    Finally, patient has history of Vitamin D Deficiency of 9 in 2008 and last vitamin D was 34 in June 2014.   Medication Sig  . ALPRAZolam  0.5 MG tablet Takes 1/2 tab prn  . amitriptyline  10 MG tablet Take 1 tablet (10 mg total) by mouth at bedtime.  Marland Kitchen aspirin 81 MG tablet Take 81 mg by mouth daily.  Marland Kitchen atenolol  25 MG tablet Take 25 mg by mouth daily.  Marland Kitchen VITAMIN D 2000 UNITS tab Take 2,000 Units by mouth daily. Takes 2 per day  . estradiol (ESTRACE) 0.5 MG tablet Take 1 tablet (0.5 mg total) by mouth daily.  Marland Kitchen Fexofenadine 180 MG tablet Take 180 mg by mouth daily.  Marland Kitchen FLAXSEED OIL Take 1,400 mg by mouth daily.  Marland Kitchen MAGNESIUM OXIDE PO Take 250 mg by mouth daily.  . MULTIVITAMIN) tab Take 1 tablet by mouth daily.  Sandrea Hammond CAPS Take 1 capsule by mouth daily.  Marland Kitchen omeprazole  40 MG capsule Take 40 mg by mouth daily.   Allergies  Allergen Reactions  . Codeine   . Penicillins   . Sudafed [Pseudoephedrine Hcl]    History  reviewed. No pertinent past surgical history.  History  Substance Use Topics  . Smoking status: Never Smoker   . Smokeless tobacco: Not on file  . Alcohol Use: No    ROS Constitutional: Denies fever, chills, weight loss/gain, headaches, insomnia, fatigue, night sweats, and change in appetite. Eyes: Denies redness, blurred vision, diplopia, discharge, itchy, watery eyes.  ENT: Denies discharge, congestion, post nasal drip, epistaxis, sore throat, earache, hearing loss, dental pain, Tinnitus, Vertigo, Sinus pain, snoring.  Cardio: Denies chest pain, palpitations, irregular heartbeat, syncope, dyspnea, diaphoresis, orthopnea, PND, claudication, edema Respiratory: denies cough, dyspnea, DOE, pleurisy, hoarseness, laryngitis, wheezing.  Gastrointestinal: Denies dysphagia, heartburn, reflux, water brash, pain, cramps, nausea, vomiting, bloating, diarrhea, constipation, hematemesis, melena, hematochezia, jaundice, hemorrhoids Genitourinary: Denies dysuria, frequency, urgency, nocturia, hesitancy, discharge, hematuria, flank pain Breast:Breast lumps, nipple discharge, bleeding.  Musculoskeletal: Denies arthralgia, myalgia, stiffness, Jt. Swelling, pain, limp, and strain/sprain. Skin: Denies puritis, rash, hives, warts, acne, eczema, changing in skin lesion Neuro: No weakness, tremor, incoordination, spasms, paresthesia, pain Psychiatric: Denies confusion, memory loss, sensory loss Endocrine: Denies change in weight, skin, hair change, nocturia, and paresthesia, diabetic polys, visual blurring, hyper / hypo glycemic episodes.  Heme/Lymph: No excessive bleeding, bruising, enlarged lymph nodes.   Physical Exam  BP 138/78  Pulse 78  Temp 98.4 F   Resp 16  Ht 5\' 1"   Wt 144 lb 9.6 oz  BMI 27.34 kg/m2  General Appearance: Well nourished, in no apparent distress. Dry cough. Eyes: PERRLA, EOMs, conjunctiva no swelling or erythema, normal fundi and vessels. Sinuses: No frontal/maxillary  tenderness ENT/Mouth: EACs patent / TMs  nl. Nares clear without erythema, swelling, mucoid exudates. Oral hygiene is good. No erythema, swelling, or exudate. Tongue normal, non-obstructing. Tonsils not swollen or erythematous. Hearing normal.  Neck: Supple, thyroid normal. No bruits, nodes or JVD. Respiratory: Respiratory effort normal.  BS equal and clear bilateral without rales, rhonci, wheezing or stridor. Cardio: Heart sounds are normal with regular rate and rhythm and no murmurs, rubs or gallops. Peripheral pulses are normal and equal bilaterally without edema. No aortic or femoral bruits. Chest: symmetric with normal excursions and percussion. Breasts: Deferred to Gyn. Abdomen: Flat, soft, with bowl sounds. Nontender, no guarding, rebound, hernias, masses, or organomegaly.  Lymphatics: Non tender without lymphadenopathy.  Musculoskeletal: Full ROM all peripheral extremities, joint stability, 5/5 strength, and normal gait. Skin: Warm and dry without rashes, lesions, cyanosis, clubbing or  ecchymosis.  Neuro: Cranial nerves intact, reflexes equal bilaterally. Normal muscle tone, no cerebellar symptoms. Sensation intact.  Pysch: Awake and oriented X 3, normal affect, Insight and Judgment appropriate.   Assessment and Plan  1. Annual Screening Examination 2. Hypertension  3. Hyperlipidemia 4. Pre Diabetes 5. Vitamin D Deficiency 6. Tracheitis - ? Allergic or infective   Continue prudent diet as discussed, weight control, BP monitoring, regular exercise, and medications. Discussed med's effects and SE's. Screening labs and tests as requested with regular follow-up as recommended.  Patient given Rx to hold for Zpak, Prednisone & Norco if Respiratory Sx's worsen.

## 2014-04-21 LAB — LIPID PANEL
Cholesterol: 204 mg/dL — ABNORMAL HIGH (ref 0–200)
HDL: 83 mg/dL (ref >39)
LDL Cholesterol: 105 mg/dL — ABNORMAL HIGH (ref 0–99)
Total CHOL/HDL Ratio: 2.5 Ratio
Triglycerides: 82 mg/dL (ref <150)
VLDL: 16 mg/dL (ref 0–40)

## 2014-04-21 LAB — MICROALBUMIN / CREATININE URINE RATIO
Creatinine, Urine: 68.8 mg/dL
MICROALB UR: 0.5 mg/dL (ref 0.00–1.89)
Microalb Creat Ratio: 7.3 mg/g (ref 0.0–30.0)

## 2014-04-21 LAB — URINALYSIS, MICROSCOPIC ONLY
BACTERIA UA: NONE SEEN
CASTS: NONE SEEN
Crystals: NONE SEEN
Squamous Epithelial / LPF: NONE SEEN

## 2014-04-21 LAB — BASIC METABOLIC PANEL WITH GFR
BUN: 12 mg/dL (ref 6–23)
CO2: 23 meq/L (ref 19–32)
CREATININE: 0.87 mg/dL (ref 0.50–1.10)
Calcium: 9.8 mg/dL (ref 8.4–10.5)
Chloride: 106 mEq/L (ref 96–112)
GFR, EST AFRICAN AMERICAN: 80 mL/min
GFR, EST NON AFRICAN AMERICAN: 69 mL/min
Glucose, Bld: 87 mg/dL (ref 70–99)
Potassium: 4.1 mEq/L (ref 3.5–5.3)
Sodium: 142 mEq/L (ref 135–145)

## 2014-04-21 LAB — MAGNESIUM: MAGNESIUM: 2 mg/dL (ref 1.5–2.5)

## 2014-04-21 LAB — HEPATIC FUNCTION PANEL
ALBUMIN: 4.4 g/dL (ref 3.5–5.2)
ALT: 18 U/L (ref 0–35)
AST: 25 U/L (ref 0–37)
Alkaline Phosphatase: 84 U/L (ref 39–117)
BILIRUBIN TOTAL: 0.4 mg/dL (ref 0.2–1.2)
Bilirubin, Direct: 0.1 mg/dL (ref 0.0–0.3)
Indirect Bilirubin: 0.3 mg/dL (ref 0.2–1.2)
Total Protein: 6.8 g/dL (ref 6.0–8.3)

## 2014-04-21 LAB — TSH: TSH: 1.157 u[IU]/mL (ref 0.350–4.500)

## 2014-04-21 LAB — HEMOGLOBIN A1C
Hgb A1c MFr Bld: 5.5 % (ref <5.7)
Mean Plasma Glucose: 111 mg/dL (ref <117)

## 2014-04-21 LAB — INSULIN, FASTING: Insulin fasting, serum: 7 u[IU]/mL (ref 3–28)

## 2014-04-21 LAB — VITAMIN D 25 HYDROXY (VIT D DEFICIENCY, FRACTURES): Vit D, 25-Hydroxy: 79 ng/mL (ref 30–89)

## 2014-07-05 ENCOUNTER — Other Ambulatory Visit: Payer: Self-pay | Admitting: *Deleted

## 2014-07-05 MED ORDER — AMITRIPTYLINE HCL 10 MG PO TABS: 10.0000 mg | ORAL_TABLET | Freq: Every day | ORAL | Status: DC

## 2014-08-03 ENCOUNTER — Other Ambulatory Visit: Payer: Self-pay | Admitting: Internal Medicine

## 2014-08-03 MED ORDER — ESTRADIOL 0.5 MG PO TABS: ORAL_TABLET | ORAL | Status: DC

## 2014-10-04 ENCOUNTER — Ambulatory Visit (INDEPENDENT_AMBULATORY_CARE_PROVIDER_SITE_OTHER): Payer: Medicare Other

## 2014-10-04 DIAGNOSIS — Z23 Encounter for immunization: Secondary | ICD-10-CM

## 2014-10-04 NOTE — Progress Notes (Signed)
Patient ID: Jean Drake, female   DOB: [birth year], 68 y.o.   MRN: [id number] Patient presents for influenza vaccination. Patient received Fluzone High- Dose 0.5cc IM Left deltoid. Patient tolerated well.

## 2014-10-04 NOTE — Addendum Note (Signed)
Addended by: Joya MartyrZMENT, Haja Crego M on: 10/04/2014 12:40 PM   Modules accepted: Orders

## 2014-10-13 ENCOUNTER — Encounter: Payer: Self-pay | Admitting: Internal Medicine

## 2014-10-13 ENCOUNTER — Ambulatory Visit (INDEPENDENT_AMBULATORY_CARE_PROVIDER_SITE_OTHER): Payer: Medicare Other | Admitting: Internal Medicine

## 2014-10-13 VITALS — BP 138/82 | HR 72 | Temp 99.5°F | Resp 16 | Ht 61.0 in | Wt 149.4 lb

## 2014-10-13 DIAGNOSIS — J321 Chronic frontal sinusitis: Secondary | ICD-10-CM

## 2014-10-13 DIAGNOSIS — J0410 Acute tracheitis without obstruction: Secondary | ICD-10-CM

## 2014-10-13 MED ORDER — PROMETHAZINE-DM 6.25-15 MG/5ML PO SYRP: ORAL_SOLUTION | ORAL | Status: AC

## 2014-10-13 MED ORDER — AZITHROMYCIN 250 MG PO TABS: ORAL_TABLET | ORAL | Status: DC

## 2014-10-13 MED ORDER — PREDNISONE 20 MG PO TABS: ORAL_TABLET | ORAL | Status: DC

## 2014-10-13 NOTE — Progress Notes (Signed)
Subjective:    Patient ID: Andreas NewportAnn L Buckwalter, female    DOB: Jul 20, 1946, 68 y.o.   MRN: [id number]  Cough This is a new problem. The current episode started in the past 7 days. The problem has been waxing and waning. The problem occurs every few minutes. The cough is productive of purulent sputum. Associated symptoms include chills, ear congestion, ear pain, a fever, headaches, myalgias, nasal congestion and a sore throat. Pertinent negatives include no chest pain, heartburn, hemoptysis, postnasal drip, rash, rhinorrhea, shortness of breath, sweats or wheezing. Nothing aggravates the symptoms. She has tried nothing for the symptoms. Her past medical history is significant for bronchitis.  Sinusitis This is a new problem. The current episode started in the past 7 days. The problem has been waxing and waning since onset. The maximum temperature recorded prior to her arrival was 100.4 - 100.9 F. The fever has been present for 3 to 4 days. The pain is mild. Associated symptoms include chills, congestion, coughing, ear pain, headaches, neck pain, sinus pressure, sneezing and a sore throat. Pertinent negatives include no shortness of breath.   Medication Sig  . ALPRAZolam (XANAX) 0.5 MG tablet Takes 1/2 tab prn  . amitriptyline (ELAVIL) 10 MG tablet Take 1 tablet (10 mg total) by mouth at bedtime.  Marland Kitchen. aspirin 81 MG tablet Take 81 mg by mouth daily.  Marland Kitchen. atenolol (TENORMIN) 25 MG tablet Take 25 mg by mouth daily.  . Cholecalciferol (VITAMIN D) 2000 UNITS tablet Take 2,000 Units by mouth daily. Takes 2 per day  . estradiol (ESTRACE) 0.5 MG tablet Take 1 tablet daily  . fexofenadine (ALLEGRA) 180 MG tablet Take 180 mg by mouth daily.  . Flaxseed, Linseed, (FLAXSEED OIL PO) Take 1,400 mg by mouth daily.  Marland Kitchen. MAGNESIUM OXIDE PO Take 250 mg by mouth daily.  . Multiple Vitamin (MULTIVITAMIN) tablet Take 1 tablet by mouth daily.  . multivitamin-lutein (OCUVITE-LUTEIN) CAPS capsule Take 1 capsule by mouth daily.  Marland Kitchen.  omeprazole (PRILOSEC) 40 MG capsule Take 40 mg by mouth daily.  Marland Kitchen. azithromycin (ZITHROMAX) 250 MG tablet Take 2 tablets (500 mg) on  Day 1,  followed by 1 tablet (250 mg) once daily on Days 2 through 5.  . predniSONE (DELTASONE) 20 MG tablet 1 tab 3 x day for 3 days, then 1 tab 2 x day for 3 days, then 1 tab 1 x day for 5 days  . promethazine-dextromethorphan (PROMETHAZINE-DM) 6.25-15 MG/5ML syrup Take 1 to 2 teaspoons 4 x day or every 4 hours as needed for cough   Allergies  Allergen Reactions  . Codeine   . Penicillins   . Sudafed [Pseudoephedrine Hcl]    Review of Systems  Constitutional: Positive for fever and chills.  HENT: Positive for congestion, ear pain, sinus pressure, sneezing and sore throat. Negative for ear discharge, facial swelling, hearing loss, mouth sores, nosebleeds, postnasal drip, rhinorrhea, tinnitus, trouble swallowing and voice change.   Eyes: Negative.   Respiratory: Positive for cough. Negative for hemoptysis, shortness of breath and wheezing.   Cardiovascular: Negative.  Negative for chest pain.  Gastrointestinal: Negative.  Negative for heartburn.  Musculoskeletal: Positive for myalgias, arthralgias and neck pain. Negative for gait problem and neck stiffness.  Skin: Negative.  Negative for color change, pallor and rash.  Neurological: Positive for tremors, weakness, numbness and headaches.   BP 138/82 mmHg  Pulse 72  Temp(Src) 99.5 F (37.5 C)  Resp 16  Ht 5\' 1"  (1.549 m)  Wt 149 lb 6.4  oz (67.767 kg)  BMI 28.24 kg/m2  Objective:   Physical Exam  Constitutional: She is oriented to person, place, and time. No distress.  HENT:  Right Ear: External ear normal.  Left Ear: External ear normal.  Nose: Nose normal.  Mouth/Throat: Oropharynx is clear and moist. No oropharyngeal exudate.  Eyes: Pupils are equal, round, and reactive to light. Right eye exhibits no discharge. Left eye exhibits no discharge.  Neck: Normal range of motion. Neck supple. No JVD  present. No thyromegaly present.  Cardiovascular: Normal rate and normal heart sounds.   No murmur heard. Pulmonary/Chest: Effort normal. No respiratory distress. She has no wheezes. She has rales.  Abdominal: Soft. Bowel sounds are normal.  Musculoskeletal: Normal range of motion. She exhibits no edema or tenderness.  Lymphadenopathy:    She has no cervical adenopathy.  Neurological: She is alert and oriented to person, place, and time. No cranial nerve deficit. Coordination normal.  Skin: Skin is warm and dry. No rash noted. No erythema. No pallor.  Psychiatric: She has a normal mood and affect.      Assessment & Plan:   1. Frontal sinusitis, unspecified chronicity   2. Acute tracheitis  - predniSONE (DELTASONE) 20 MG tablet; 1 tab 3 x day for 3 days, then 1 tab 2 x day for 3 days, then 1 tab 1 x day for 5 days  Dispense: 20 tablet; Refill: 0 - azithromycin (ZITHROMAX) 250 MG tablet; Take 2 tablets (500 mg) on  Day 1,  followed by 1 tablet (250 mg) once daily on Days 2 through 5.  Dispense: 6 each; Refill: 1 - promethazine-dextromethorphan (PROMETHAZINE-DM) 6.25-15 MG/5ML syrup; Take 1 to 2 teaspoons 4 x day or every 4 hours as needed for cough  Dispense: 240 mL; Refill: 1

## 2014-10-27 ENCOUNTER — Encounter: Payer: Self-pay | Admitting: Internal Medicine

## 2014-10-27 ENCOUNTER — Ambulatory Visit (INDEPENDENT_AMBULATORY_CARE_PROVIDER_SITE_OTHER): Payer: Medicare Other | Admitting: Internal Medicine

## 2014-10-27 VITALS — BP 124/80 | HR 72 | Temp 97.9°F | Resp 16 | Ht 61.0 in | Wt 149.4 lb

## 2014-10-27 DIAGNOSIS — I10 Essential (primary) hypertension: Secondary | ICD-10-CM

## 2014-10-27 DIAGNOSIS — E782 Mixed hyperlipidemia: Secondary | ICD-10-CM

## 2014-10-27 DIAGNOSIS — R7303 Prediabetes: Secondary | ICD-10-CM

## 2014-10-27 DIAGNOSIS — Z79899 Other long term (current) drug therapy: Secondary | ICD-10-CM

## 2014-10-27 DIAGNOSIS — E559 Vitamin D deficiency, unspecified: Secondary | ICD-10-CM

## 2014-10-27 LAB — CBC WITH DIFFERENTIAL/PLATELET
BASOS ABS: 0.1 10*3/uL (ref 0.0–0.1)
BASOS PCT: 1 % (ref 0–1)
EOS ABS: 0.1 10*3/uL (ref 0.0–0.7)
Eosinophils Relative: 2 % (ref 0–5)
HCT: 40.4 % (ref 36.0–46.0)
HEMOGLOBIN: 13.8 g/dL (ref 12.0–15.0)
Lymphocytes Relative: 14 % (ref 12–46)
Lymphs Abs: 1 10*3/uL (ref 0.7–4.0)
MCH: 28.9 pg (ref 26.0–34.0)
MCHC: 34.2 g/dL (ref 30.0–36.0)
MCV: 84.7 fL (ref 78.0–100.0)
MPV: 8.8 fL — AB (ref 9.4–12.4)
Monocytes Absolute: 0.8 10*3/uL (ref 0.1–1.0)
Monocytes Relative: 12 % (ref 3–12)
NEUTROS PCT: 71 % (ref 43–77)
Neutro Abs: 4.9 10*3/uL (ref 1.7–7.7)
Platelets: 321 10*3/uL (ref 150–400)
RBC: 4.77 MIL/uL (ref 3.87–5.11)
RDW: 13.8 % (ref 11.5–15.5)
WBC: 6.9 10*3/uL (ref 4.0–10.5)

## 2014-10-27 NOTE — Progress Notes (Signed)
Patient ID: Jean Drake, female   DOB: 1946-01-23, 68 y.o.   MRN: [id number]  Left before seen for another appointment  (& rescheduled)

## 2014-10-28 LAB — BASIC METABOLIC PANEL WITH GFR
BUN: 21 mg/dL (ref 6–23)
CALCIUM: 9.6 mg/dL (ref 8.4–10.5)
CO2: 27 mEq/L (ref 19–32)
Chloride: 106 mEq/L (ref 96–112)
Creat: 0.96 mg/dL (ref 0.50–1.10)
GFR, EST AFRICAN AMERICAN: 70 mL/min
GFR, Est Non African American: 61 mL/min
Glucose, Bld: 91 mg/dL (ref 70–99)
Potassium: 4.6 mEq/L (ref 3.5–5.3)
SODIUM: 141 meq/L (ref 135–145)

## 2014-10-28 LAB — LIPID PANEL
CHOLESTEROL: 209 mg/dL — AB (ref 0–200)
HDL: 72 mg/dL (ref >39)
LDL Cholesterol: 105 mg/dL — ABNORMAL HIGH (ref 0–99)
Total CHOL/HDL Ratio: 2.9 Ratio
Triglycerides: 160 mg/dL — ABNORMAL HIGH (ref <150)
VLDL: 32 mg/dL (ref 0–40)

## 2014-10-28 LAB — MAGNESIUM: MAGNESIUM: 2 mg/dL (ref 1.5–2.5)

## 2014-10-28 LAB — HEPATIC FUNCTION PANEL
ALT: 17 U/L (ref 0–35)
AST: 22 U/L (ref 0–37)
Albumin: 3.9 g/dL (ref 3.5–5.2)
Alkaline Phosphatase: 78 U/L (ref 39–117)
BILIRUBIN TOTAL: 0.4 mg/dL (ref 0.2–1.2)
Bilirubin, Direct: 0.1 mg/dL (ref 0.0–0.3)
Indirect Bilirubin: 0.3 mg/dL (ref 0.2–1.2)
Total Protein: 6.6 g/dL (ref 6.0–8.3)

## 2014-10-28 LAB — INSULIN, FASTING: INSULIN FASTING, SERUM: 6.7 u[IU]/mL (ref 2.0–19.6)

## 2014-10-28 LAB — HEMOGLOBIN A1C
HEMOGLOBIN A1C: 5.9 % — AB (ref <5.7)
Mean Plasma Glucose: 123 mg/dL — ABNORMAL HIGH (ref <117)

## 2014-10-28 LAB — TSH: TSH: 1.385 u[IU]/mL (ref 0.350–4.500)

## 2014-10-28 LAB — VITAMIN D 25 HYDROXY (VIT D DEFICIENCY, FRACTURES): VIT D 25 HYDROXY: 61 ng/mL (ref 30–100)

## 2014-11-02 ENCOUNTER — Other Ambulatory Visit: Payer: Self-pay | Admitting: *Deleted

## 2014-11-02 ENCOUNTER — Other Ambulatory Visit: Payer: Self-pay | Admitting: Internal Medicine

## 2014-11-02 MED ORDER — ATENOLOL 50 MG PO TABS: 50.0000 mg | ORAL_TABLET | Freq: Every day | ORAL | Status: DC

## 2014-11-02 MED ORDER — AMITRIPTYLINE HCL 10 MG PO TABS: 10.0000 mg | ORAL_TABLET | Freq: Every day | ORAL | Status: DC

## 2014-11-18 ENCOUNTER — Ambulatory Visit (INDEPENDENT_AMBULATORY_CARE_PROVIDER_SITE_OTHER): Payer: Medicare Other | Admitting: Internal Medicine

## 2014-11-18 ENCOUNTER — Encounter: Payer: Self-pay | Admitting: Internal Medicine

## 2014-11-18 VITALS — BP 136/78 | HR 64 | Temp 98.2°F | Resp 16 | Ht 61.0 in | Wt 149.4 lb

## 2014-11-18 DIAGNOSIS — E559 Vitamin D deficiency, unspecified: Secondary | ICD-10-CM

## 2014-11-18 DIAGNOSIS — R7303 Prediabetes: Secondary | ICD-10-CM

## 2014-11-18 DIAGNOSIS — R7309 Other abnormal glucose: Secondary | ICD-10-CM

## 2014-11-18 DIAGNOSIS — E782 Mixed hyperlipidemia: Secondary | ICD-10-CM

## 2014-11-18 DIAGNOSIS — Z79899 Other long term (current) drug therapy: Secondary | ICD-10-CM

## 2014-11-18 DIAGNOSIS — E663 Overweight: Secondary | ICD-10-CM | POA: Insufficient documentation

## 2014-11-18 DIAGNOSIS — M67449 Ganglion, unspecified hand: Secondary | ICD-10-CM

## 2014-11-18 DIAGNOSIS — I10 Essential (primary) hypertension: Secondary | ICD-10-CM

## 2014-11-18 DIAGNOSIS — L729 Follicular cyst of the skin and subcutaneous tissue, unspecified: Secondary | ICD-10-CM

## 2014-11-18 HISTORY — DX: Morbid (severe) obesity due to excess calories: E66.01

## 2014-11-18 NOTE — Progress Notes (Signed)
Patient ID: Jean Drake, female   DOB: 08-21-1946, 69 y.o.   MRN: [id number]   This very nice 69 y.o.MWF presents for 3 month follow up with Hypertension, Hyperlipidemia, Pre-Diabetes and Vitamin D Deficiency.    Patient is treated for HTN & BP has been controlled at home. Today's BP: 136/78 mmHg. Patient has had no complaints of any cardiac type chest pain, palpitations, dyspnea/orthopnea/PND, dizziness, claudication, or dependent edema.   Hyperlipidemia is controlled with diet & meds. Patient denies myalgias or other med SE's. Last Lipids were near goal - Total Chol  209*; HDL 72; LDL 105; Triglycerides 160 on 10/27/2014.   Also, the patient has history of PreDiabetes and has had no symptoms of reactive hypoglycemia, diabetic polys, paresthesias or visual blurring.  Last A1c was 5.9% on 10/27/2014.   Further, the patient also has history of Vitamin D Deficiency and supplements vitamin D without any suspected side-effects. Last vitamin D was  61 on  10/27/2014.   Medication List   amitriptyline 10 MG tablet  Commonly known as:  ELAVIL  Take 1 tablet (10 mg total) by mouth at bedtime.     aspirin 81 MG tablet  Take 81 mg by mouth daily.     atenolol 50 MG tablet  Commonly known as:  TENORMIN  Take 1 tablet (50 mg total) by mouth daily.     estradiol 0.5 MG tablet  Commonly known as:  ESTRACE  Take 1 tablet daily     fexofenadine 180 MG tablet  Commonly known as:  ALLEGRA  Take 180 mg by mouth daily.     FLAXSEED OIL PO  Take 1,400 mg by mouth daily.     MAGNESIUM OXIDE PO  Take 250 mg by mouth daily.     multivitamin tablet  Take 1 tablet by mouth daily.     multivitamin-lutein Caps capsule  Take 1 capsule by mouth daily.     omeprazole 40 MG capsule  Commonly known as:  PRILOSEC  Take 40 mg by mouth daily.     Vitamin D 2000 UNITS tablet  Take 2,000 Units by mouth daily. Takes 4 per day     Allergies  Allergen Reactions  . Codeine   . Penicillins   . Sudafed  [Pseudoephedrine Hcl]     PMHx:  History reviewed. No pertinent past medical history. Immunization History  Administered Date(s) Administered  . DTaP 09/12/2013  . Influenza Split 09/12/2013  . Influenza, High Dose Seasonal PF 10/04/2014  . Pneumococcal Conjugate-13 10/04/2014  . Pneumococcal Polysaccharide-23 03/29/2009  . Td 12/20/2003  . Zoster 06/07/2010   History reviewed. No pertinent past surgical history. FHx:    Reviewed / unchanged  SHx:    Reviewed / unchanged  Systems Review:  Constitutional: Denies fever, chills, wt changes, headaches, insomnia, fatigue, night sweats, change in appetite. Eyes: Denies redness, blurred vision, diplopia, discharge, itchy, watery eyes.  ENT: Denies discharge, congestion, post nasal drip, epistaxis, sore throat, earache, hearing loss, dental pain, tinnitus, vertigo, sinus pain, snoring.  CV: Denies chest pain, palpitations, irregular heartbeat, syncope, dyspnea, diaphoresis, orthopnea, PND, claudication or edema. Respiratory: denies cough, dyspnea, DOE, pleurisy, hoarseness, laryngitis, wheezing.  Gastrointestinal: Denies dysphagia, odynophagia, heartburn, reflux, water brash, abdominal pain or cramps, nausea, vomiting, bloating, diarrhea, constipation, hematemesis, melena, hematochezia  or hemorrhoids. Genitourinary: Denies dysuria, frequency, urgency, nocturia, hesitancy, discharge, hematuria or flank pain. Musculoskeletal: Denies arthralgias, myalgias, stiffness, jt. swelling, pain, limping or strain/sprain.  Skin: Denies pruritus, rash, hives, warts, acne, eczema or  change in skin lesion(s). Neuro: No weakness, tremor, incoordination, spasms, paresthesia or pain. Psychiatric: Denies confusion, memory loss or sensory loss. Endo: Denies change in weight, skin or hair change.  Heme/Lymph: No excessive bleeding, bruising or enlarged lymph nodes.  Physical Exam  BP 136/78  Pulse 64  Temp 98.2 F   Resp 16  Ht    Wt 149 lb 6.4 oz     BMI 28.24   Appears well nourished and in no distress. Eyes: PERRLA, EOMs, conjunctiva no swelling or erythema. Sinuses: No frontal/maxillary tenderness ENT/Mouth: EAC's clear, TM's nl w/o erythema, bulging. Nares clear w/o erythema, swelling, exudates. Oropharynx clear without erythema or exudates. Oral hygiene is good. Tongue normal, non obstructing. Hearing intact.  Neck: Supple. Thyroid nl. Car 2+/2+ without bruits, nodes or JVD. Chest: Respirations nl with BS clear & equal w/o rales, rhonchi, wheezing or stridor.  Cor: Heart sounds normal w/ regular rate and rhythm without sig. murmurs, gallops, clicks, or rubs. Peripheral pulses normal and equal  without edema.  Abdomen: Soft & bowel sounds normal. Non-tender w/o guarding, rebound, hernias, masses, or organomegaly.  Lymphatics: Unremarkable.  Musculoskeletal: Full ROM all peripheral extremities, joint stability, 5/5 strength, and normal gait.  Skin: Warm, dry without exposed rashes, lesions or ecchymosis apparent.  Neuro: Cranial nerves intact, reflexes equal bilaterally. Sensory-motor testing grossly intact. Tendon reflexes grossly intact.  Pysch: Alert & oriented x 3.  Insight and judgement nl & appropriate. No ideations.  Assessment and Plan:  1. Hypertension - Continue monitor blood pressure at home. Continue diet/meds same.  2. Hyperlipidemia - Continue diet/meds, exercise,& lifestyle modifications. Continue monitor periodic cholesterol/liver & renal functions   3.  Pre-Diabetes - Continue diet, exercise, lifestyle modifications. Monitor appropriate labs.  4. Vitamin D Deficiency - Continue supplementation.   Recommended regular exercise, BP monitoring, weight control, and discussed med and SE's. Recommended labs to assess and monitor clinical status. Further disposition pending results of labs.

## 2014-11-18 NOTE — Patient Instructions (Signed)

## 2015-04-21 ENCOUNTER — Encounter: Payer: Self-pay | Admitting: Internal Medicine

## 2015-06-27 ENCOUNTER — Encounter: Payer: Self-pay | Admitting: Internal Medicine

## 2015-06-27 ENCOUNTER — Ambulatory Visit (INDEPENDENT_AMBULATORY_CARE_PROVIDER_SITE_OTHER): Payer: Medicare Other | Admitting: Internal Medicine

## 2015-06-27 VITALS — BP 142/64 | HR 66 | Temp 98.0°F | Resp 18 | Ht 61.0 in

## 2015-06-27 DIAGNOSIS — R112 Nausea with vomiting, unspecified: Secondary | ICD-10-CM | POA: Diagnosis not present

## 2015-06-27 DIAGNOSIS — R42 Dizziness and giddiness: Secondary | ICD-10-CM

## 2015-06-27 MED ORDER — ONDANSETRON HCL 4 MG PO TABS: 4.0000 mg | ORAL_TABLET | Freq: Every day | ORAL | Status: DC | PRN

## 2015-06-27 MED ORDER — DIAZEPAM 2 MG PO TABS: 2.0000 mg | ORAL_TABLET | Freq: Three times a day (TID) | ORAL | Status: DC | PRN

## 2015-06-27 MED ORDER — PROMETHAZINE HCL 25 MG/ML IJ SOLN
25.0000 mg | Freq: Once | INTRAMUSCULAR | Status: AC
  Administered 2015-06-27: 25 mg via INTRAMUSCULAR

## 2015-06-27 MED ORDER — PREDNISONE 20 MG PO TABS: ORAL_TABLET | ORAL | Status: DC

## 2015-06-27 NOTE — Patient Instructions (Signed)

## 2015-06-27 NOTE — Progress Notes (Signed)
   Subjective:    Patient ID: Jean Drake, female    DOB: [birth year], 69 y.o.   MRN: [id number]  Dizziness Associated symptoms include congestion, coughing, headaches, nausea and vomiting. Pertinent negatives include no abdominal pain, chills, fatigue, fever or sore throat.  Emesis  Associated symptoms include coughing, dizziness and headaches. Pertinent negatives include no abdominal pain, chills, diarrhea or fever.   Patient reports that she got up this morning and had a headache and had some dizziness.  She reports that she has had some accompanying dizziness and vomiting.  She reports that she is nauseated when she eats or drinking anything.  She reports that she had to ride here laying down.  Anytime she sits up she feels a lot worse.  She reports that she has not had vertigo in a long time since she started taking allegra. She feels like she has some congestion that has been going down the back of her throat.  She has been doing a lot of dry heaving as well.  She has no sick contacts that she is aware of.     Review of Systems  Constitutional: Negative for fever, chills and fatigue.  HENT: Positive for congestion and postnasal drip. Negative for rhinorrhea, sinus pressure, sore throat and trouble swallowing.   Eyes:       Dry eyes  Respiratory: Positive for cough. Negative for chest tightness and shortness of breath.   Gastrointestinal: Positive for nausea and vomiting. Negative for abdominal pain, diarrhea and constipation.  Neurological: Positive for dizziness and headaches.       Objective:   Physical Exam  Constitutional: She is oriented to person, place, and time. She appears well-developed and well-nourished. No distress.  HENT:  Head: Normocephalic and atraumatic.  Mouth/Throat: Oropharynx is clear and moist. No oropharyngeal exudate.  Eyes: Conjunctivae and EOM are normal. Pupils are equal, round, and reactive to light. No scleral icterus.  Neck: Normal range of motion.  Neck supple. No JVD present. No thyromegaly present.  Cardiovascular: Normal rate, regular rhythm, normal heart sounds and intact distal pulses.  Exam reveals no gallop and no friction rub.   No murmur heard. Pulmonary/Chest: Effort normal and breath sounds normal. No respiratory distress. She has no wheezes. She has no rales. She exhibits no tenderness.  Abdominal: Soft. Bowel sounds are normal. She exhibits no distension and no mass. There is no tenderness. There is no rebound and no guarding.  Musculoskeletal: Normal range of motion.  Lymphadenopathy:    She has no cervical adenopathy.  Neurological: She is alert and oriented to person, place, and time.  Skin: Skin is warm and dry. She is not diaphoretic.  Psychiatric: She has a normal mood and affect. Her behavior is normal. Judgment and thought content normal.  Nursing note and vitals reviewed.   Filed Vitals:   06/27/15 1600  BP: 142/64  Pulse: 66  Temp: 98 F (36.7 C)  Resp: 18         Assessment & Plan:    1. Nausea and vomiting, vomiting of unspecified type  - promethazine (PHENERGAN) injection 25 mg; Inject 1 mL (25 mg total) into the muscle once.  2. Dizziness and giddiness -vertigo without neuro deficits, heart rrr on exam.  Previous ekg reviewed with no abnormalities.  Suspect due to middle ear effusion.  No improvement send to ENT -prednisone -valium -zofran

## 2015-07-07 ENCOUNTER — Ambulatory Visit (INDEPENDENT_AMBULATORY_CARE_PROVIDER_SITE_OTHER): Payer: Medicare Other | Admitting: Internal Medicine

## 2015-07-07 ENCOUNTER — Encounter: Payer: Self-pay | Admitting: Internal Medicine

## 2015-07-07 VITALS — BP 122/74 | HR 64 | Temp 97.7°F | Resp 16 | Ht 61.5 in | Wt 142.8 lb

## 2015-07-07 DIAGNOSIS — Z1389 Encounter for screening for other disorder: Secondary | ICD-10-CM

## 2015-07-07 DIAGNOSIS — E782 Mixed hyperlipidemia: Secondary | ICD-10-CM | POA: Diagnosis not present

## 2015-07-07 DIAGNOSIS — R7309 Other abnormal glucose: Secondary | ICD-10-CM | POA: Diagnosis not present

## 2015-07-07 DIAGNOSIS — K2100 Gastro-esophageal reflux disease with esophagitis, without bleeding: Secondary | ICD-10-CM

## 2015-07-07 DIAGNOSIS — Z789 Other specified health status: Secondary | ICD-10-CM | POA: Diagnosis not present

## 2015-07-07 DIAGNOSIS — Z1331 Encounter for screening for depression: Secondary | ICD-10-CM

## 2015-07-07 DIAGNOSIS — E559 Vitamin D deficiency, unspecified: Secondary | ICD-10-CM

## 2015-07-07 DIAGNOSIS — R7303 Prediabetes: Secondary | ICD-10-CM

## 2015-07-07 DIAGNOSIS — Z6826 Body mass index (BMI) 26.0-26.9, adult: Secondary | ICD-10-CM | POA: Diagnosis not present

## 2015-07-07 DIAGNOSIS — I10 Essential (primary) hypertension: Secondary | ICD-10-CM

## 2015-07-07 DIAGNOSIS — Z9181 History of falling: Secondary | ICD-10-CM

## 2015-07-07 DIAGNOSIS — Z79899 Other long term (current) drug therapy: Secondary | ICD-10-CM | POA: Diagnosis not present

## 2015-07-07 DIAGNOSIS — Z1212 Encounter for screening for malignant neoplasm of rectum: Secondary | ICD-10-CM

## 2015-07-07 LAB — BASIC METABOLIC PANEL WITH GFR
BUN: 21 mg/dL (ref 7–25)
CALCIUM: 9.5 mg/dL (ref 8.6–10.4)
CO2: 26 mmol/L (ref 20–31)
Chloride: 104 mmol/L (ref 98–110)
Creat: 0.94 mg/dL (ref 0.50–0.99)
GFR, EST AFRICAN AMERICAN: 72 mL/min (ref >=60)
GFR, EST NON AFRICAN AMERICAN: 62 mL/min (ref >=60)
Glucose, Bld: 96 mg/dL (ref 65–99)
POTASSIUM: 4.9 mmol/L (ref 3.5–5.3)
Sodium: 141 mmol/L (ref 135–146)

## 2015-07-07 LAB — HEMOGLOBIN A1C
Hgb A1c MFr Bld: 5.5 % (ref <5.7)
MEAN PLASMA GLUCOSE: 111 mg/dL (ref <117)

## 2015-07-07 LAB — CBC WITH DIFFERENTIAL/PLATELET
BASOS ABS: 0.1 10*3/uL (ref 0.0–0.1)
BASOS PCT: 1 % (ref 0–1)
EOS ABS: 0.2 10*3/uL (ref 0.0–0.7)
EOS PCT: 3 % (ref 0–5)
HCT: 44.3 % (ref 36.0–46.0)
Hemoglobin: 14.5 g/dL (ref 12.0–15.0)
Lymphocytes Relative: 17 % (ref 12–46)
Lymphs Abs: 1.1 10*3/uL (ref 0.7–4.0)
MCH: 29.1 pg (ref 26.0–34.0)
MCHC: 32.7 g/dL (ref 30.0–36.0)
MCV: 88.8 fL (ref 78.0–100.0)
MONO ABS: 0.7 10*3/uL (ref 0.1–1.0)
MPV: 9 fL (ref 8.6–12.4)
Monocytes Relative: 11 % (ref 3–12)
Neutro Abs: 4.6 10*3/uL (ref 1.7–7.7)
Neutrophils Relative %: 68 % (ref 43–77)
PLATELETS: 312 10*3/uL (ref 150–400)
RBC: 4.99 MIL/uL (ref 3.87–5.11)
RDW: 13.6 % (ref 11.5–15.5)
WBC: 6.7 10*3/uL (ref 4.0–10.5)

## 2015-07-07 LAB — LIPID PANEL
CHOLESTEROL: 192 mg/dL (ref 125–200)
HDL: 66 mg/dL (ref >=46)
LDL CALC: 99 mg/dL (ref <130)
TRIGLYCERIDES: 134 mg/dL (ref <150)
Total CHOL/HDL Ratio: 2.9 Ratio (ref <=5.0)
VLDL: 27 mg/dL (ref <30)

## 2015-07-07 LAB — HEPATIC FUNCTION PANEL
ALBUMIN: 4.2 g/dL (ref 3.6–5.1)
ALK PHOS: 71 U/L (ref 33–130)
ALT: 16 U/L (ref 6–29)
AST: 23 U/L (ref 10–35)
BILIRUBIN INDIRECT: 0.3 mg/dL (ref 0.2–1.2)
BILIRUBIN TOTAL: 0.4 mg/dL (ref 0.2–1.2)
Bilirubin, Direct: 0.1 mg/dL (ref <=0.2)
TOTAL PROTEIN: 6.5 g/dL (ref 6.1–8.1)

## 2015-07-07 LAB — MAGNESIUM: Magnesium: 2.1 mg/dL (ref 1.5–2.5)

## 2015-07-07 LAB — TSH: TSH: 0.917 u[IU]/mL (ref 0.350–4.500)

## 2015-07-07 NOTE — Patient Instructions (Signed)
Recommend Adult Low dose Aspirin or   coated  Aspirin 81 mg daily   To reduce risk of Colon Cancer 20 %,   Skin Cancer 26 % ,   Melanoma 46%   and   Pancreatic cancer 60%  ++++++++++++++++++  Vitamin D goal   is between 70-100.   Please make sure that you are taking your Vitamin D as directed.   It is very important as a natural anti-inflammatory   helping hair, skin, and nails, as well as reducing stroke and heart attack risk.   It helps your bones and helps with mood.  It also decreases numerous cancer risks so please take it as directed.   Low Vit D is associated with a 200-300% higher risk for CANCER   and 200-300% higher risk for HEART   ATTACK  &  STROKE.   ......................................  It is also associated with higher death rate at younger ages,   autoimmune diseases like Rheumatoid arthritis, Lupus, Multiple Sclerosis.     Also many other serious conditions, like depression, Alzheimer's  Dementia, infertility, muscle aches, fatigue, fibromyalgia - just to name a few.  +++++++++++++++++++  Recommend the book "The END of DIETING" by Dr Joel Fuhrman   & the book "The END of DIABETES " by Dr Joel Fuhrman  At [employer] - get book & Audio CD's     Being diabetic has a  300% increased risk for heart attack, stroke, cancer, and alzheimer- type vascular dementia. It is very important that you work harder with diet by avoiding all foods that are white. Avoid white rice (brown & wild rice is OK), white potatoes (sweetpotatoes in moderation is OK), White bread or wheat bread or anything made out of white flour like bagels, donuts, rolls, buns, biscuits, cakes, pastries, cookies, pizza crust, and pasta (made from white flour & egg whites) - vegetarian pasta or spinach or wheat pasta is OK. Multigrain breads like Arnold's or [employer], or multigrain sandwich thins or flatbreads.  Diet, exercise and weight loss can reverse and cure diabetes in the early  stages.  Diet, exercise and weight loss is very important in the control and prevention of complications of diabetes which affects every system in your body, ie. Brain - dementia/stroke, eyes - glaucoma/blindness, heart - heart attack/heart failure, kidneys - dialysis, stomach - gastric paralysis, intestines - malabsorption, nerves - severe painful neuritis, circulation - gangrene & loss of a leg(s), and finally cancer and Alzheimers.    I recommend avoid fried & greasy foods,  sweets/candy, white rice (brown or wild rice or Quinoa is OK), white potatoes (sweet potatoes are OK) - anything made from white flour - bagels, doughnuts, rolls, buns, biscuits,white and wheat breads, pizza crust and traditional pasta made of white flour & egg white(vegetarian pasta or spinach or wheat pasta is OK).  Multi-grain bread is OK - like multi-grain flat bread or sandwich thins. Avoid alcohol in excess. Exercise is also important.    Eat all the vegetables you want - avoid meat, especially red meat and dairy - especially cheese.  Cheese is the most concentrated form of trans-fats which is the worst thing to clog up our arteries. Veggie cheese is OK which can be found in the fresh produce section at [employer] or [employer] or [employer]  ++++++++++++++++++++++++++   Preventive Care for Adults  A healthy lifestyle and preventive care can promote health and wellness. Preventive health guidelines for women include the following key practices.  A routine   to check with your health care provider about your health and preventive screening. It is a chance to share any concerns and updates on your health and to receive a thorough exam.  Visit your dentist for a routine exam and preventive care every 6 months. Brush your teeth twice a day and floss once a day. Good oral hygiene prevents tooth decay and gum disease.  The frequency of eye exams is based on your age, health, family medical history, use  of contact lenses, and other factors. Follow your health care provider's recommendations for frequency of eye exams.  Eat a healthy diet. Foods like vegetables, fruits, whole grains, low-fat dairy products, and lean protein foods contain the nutrients you need without too many calories. Decrease your intake of foods high in solid fats, added sugars, and salt. Eat the right amount of calories for you.Get information about a proper diet from your health care provider, if necessary.  Regular physical exercise is one of the most important things you can do for your health. Most adults should get at least 150 minutes of moderate-intensity exercise (any activity that increases your heart rate and causes you to sweat) each week. In addition, most adults need muscle-strengthening exercises on 2 or more days a week.  Maintain a healthy weight. The body mass index (BMI) is a screening tool to identify possible weight problems. It provides an estimate of body fat based on height and weight. Your health care provider can find your BMI and can help you achieve or maintain a healthy weight.For adults 20 years and older:  A BMI below 18.5 is considered underweight.  A BMI of 18.5 to 24.9 is normal.  A BMI of 25 to 29.9 is considered overweight.  A BMI of 30 and above is considered obese.  Maintain normal blood lipids and cholesterol levels by exercising and minimizing your intake of saturated fat. Eat a balanced diet with plenty of fruit and vegetables. If your lipid or cholesterol levels are high, you are over 50, or you are at high risk for heart disease, you may need your cholesterol levels checked more frequently.Ongoing high lipid and cholesterol levels should be treated with medicines if diet and exercise are not working.  If you smoke, find out from your health care provider how to quit. If you do not use tobacco, do not start.  Lung cancer screening is recommended for adults aged 55-80 years who are  at high risk for developing lung cancer because of a history of smoking. A yearly low-dose CT scan of the lungs is recommended for people who have at least a 30-pack-year history of smoking and are a current smoker or have quit within the past 15 years. A pack year of smoking is smoking an average of 1 pack of cigarettes a day for 1 year (for example: 1 pack a day for 30 years or 2 packs a day for 15 years). Yearly screening should continue until the smoker has stopped smoking for at least 15 years. Yearly screening should be stopped for people who develop a health problem that would prevent them from having lung cancer treatment.  Avoid use of street drugs. Do not share needles with anyone. Ask for help if you need support or instructions about stopping the use of drugs.  High blood pressure causes heart disease and increases the risk of stroke.  Ongoing high blood pressure should be treated with medicines if weight loss and exercise do not work.  If you are 55-79   years old, ask your health care provider if you should take aspirin to prevent strokes.  Diabetes screening involves taking a blood sample to check your fasting blood sugar level. This should be done once every 3 years, after age 45, if you are within normal weight and without risk factors for diabetes. Testing should be considered at a younger age or be carried out more frequently if you are overweight and have at least 1 risk factor for diabetes.  Breast cancer screening is essential preventive care for women. You should practice "breast self-awareness." This means understanding the normal appearance and feel of your breasts and may include breast self-examination. Any changes detected, no matter how small, should be reported to a health care provider. Women in their 20s and 30s should have a clinical breast exam (CBE) by a health care provider as part of a regular health exam every 1 to 3 years. After age 40, women should have a CBE every  year. Starting at age 40, women should consider having a mammogram (breast X-ray test) every year. Women who have a family history of breast cancer should talk to their health care provider about genetic screening. Women at a high risk of breast cancer should talk to their health care providers about having an MRI and a mammogram every year.  Breast cancer gene (BRCA)-related cancer risk assessment is recommended for women who have family members with BRCA-related cancers. BRCA-related cancers include breast, ovarian, tubal, and peritoneal cancers. Having family members with these cancers may be associated with an increased risk for harmful changes (mutations) in the breast cancer genes BRCA1 and BRCA2. Results of the assessment will determine the need for genetic counseling and BRCA1 and BRCA2 testing.  Routine pelvic exams to screen for cancer are no longer recommended for nonpregnant women who are considered low risk for cancer of the pelvic organs (ovaries, uterus, and vagina) and who do not have symptoms. Ask your health care provider if a screening pelvic exam is right for you.  If you have had past treatment for cervical cancer or a condition that could lead to cancer, you need Pap tests and screening for cancer for at least 20 years after your treatment. If Pap tests have been discontinued, your risk factors (such as having a new sexual partner) need to be reassessed to determine if screening should be resumed. Some women have medical problems that increase the chance of getting cervical cancer. In these cases, your health care provider may recommend more frequent screening and Pap tests.    Colorectal cancer can be detected and often prevented. Most routine colorectal cancer screening begins at the age of 50 years and continues through age 75 years. However, your health care provider may recommend screening at an earlier age if you have risk factors for colon cancer. On a yearly basis, your health  care provider may provide home test kits to check for hidden blood in the stool. Use of a small camera at the end of a tube, to directly examine the colon (sigmoidoscopy or colonoscopy), can detect the earliest forms of colorectal cancer. Talk to your health care provider about this at age 50, when routine screening begins. Direct exam of the colon should be repeated every 5-10 years through age 75 years, unless early forms of pre-cancerous polyps or small growths are found.  Osteoporosis is a disease in which the bones lose minerals and strength with aging. This can result in serious bone fractures or breaks. The risk of osteoporosis   can be identified using a bone density scan. Women ages 65 years and over and women at risk for fractures or osteoporosis should discuss screening with their health care providers. Ask your health care provider whether you should take a calcium supplement or vitamin D to reduce the rate of osteoporosis.  Menopause can be associated with physical symptoms and risks. Hormone replacement therapy is available to decrease symptoms and risks. You should talk to your health care provider about whether hormone replacement therapy is right for you.  Use sunscreen. Apply sunscreen liberally and repeatedly throughout the day. You should seek shade when your shadow is shorter than you. Protect yourself by wearing long sleeves, pants, a wide-brimmed hat, and sunglasses year round, whenever you are outdoors.  Once a month, do a whole body skin exam, using a mirror to look at the skin on your back. Tell your health care provider of new moles, moles that have irregular borders, moles that are larger than a pencil eraser, or moles that have changed in shape or color.  Stay current with required vaccines (immunizations).  Influenza vaccine. All adults should be immunized every year.  Tetanus, diphtheria, and acellular pertussis (Td, Tdap) vaccine. Pregnant women should receive 1 dose of  Tdap vaccine during each pregnancy. The dose should be obtained regardless of the length of time since the last dose. Immunization is preferred during the 27th-36th week of gestation. An adult who has not previously received Tdap or who does not know her vaccine status should receive 1 dose of Tdap. This initial dose should be followed by tetanus and diphtheria toxoids (Td) booster doses every 10 years. Adults with an unknown or incomplete history of completing a 3-dose immunization series with Td-containing vaccines should begin or complete a primary immunization series including a Tdap dose. Adults should receive a Td booster every 10 years.    Zoster vaccine. One dose is recommended for adults aged 60 years or older unless certain conditions are present.    Pneumococcal 13-valent conjugate (PCV13) vaccine. When indicated, a person who is uncertain of her immunization history and has no record of immunization should receive the PCV13 vaccine. An adult aged 19 years or older who has certain medical conditions and has not been previously immunized should receive 1 dose of PCV13 vaccine. This PCV13 should be followed with a dose of pneumococcal polysaccharide (PPSV23) vaccine. The PPSV23 vaccine dose should be obtained at least 8 weeks after the dose of PCV13 vaccine. An adult aged 19 years or older who has certain medical conditions and previously received 1 or more doses of PPSV23 vaccine should receive 1 dose of PCV13. The PCV13 vaccine dose should be obtained 1 or more years after the last PPSV23 vaccine dose.    Pneumococcal polysaccharide (PPSV23) vaccine. When PCV13 is also indicated, PCV13 should be obtained first. All adults aged 65 years and older should be immunized. An adult younger than age 65 years who has certain medical conditions should be immunized. Any person who resides in a nursing home or long-term care facility should be immunized. An adult smoker should be immunized. People with an  immunocompromised condition and certain other conditions should receive both PCV13 and PPSV23 vaccines. People with human immunodeficiency virus (HIV) infection should be immunized as soon as possible after diagnosis. Immunization during chemotherapy or radiation therapy should be avoided. Routine use of PPSV23 vaccine is not recommended for American Indians, [place] Natives, or people younger than 65 years unless there are medical conditions that require   PPSV23 vaccine. When indicated, people who have unknown immunization and have no record of immunization should receive PPSV23 vaccine. One-time revaccination 5 years after the first dose of PPSV23 is recommended for people aged 19-64 years who have chronic kidney failure, nephrotic syndrome, asplenia, or immunocompromised conditions. People who received 1-2 doses of PPSV23 before age 65 years should receive another dose of PPSV23 vaccine at age 65 years or later if at least 5 years have passed since the previous dose. Doses of PPSV23 are not needed for people immunized with PPSV23 at or after age 65 years.   Preventive Services / Frequency  Ages 65 years and over  Blood pressure check.  Lipid and cholesterol check.  Lung cancer screening. / Every year if you are aged 55-80 years and have a 30-pack-year history of smoking and currently smoke or have quit within the past 15 years. Yearly screening is stopped once you have quit smoking for at least 15 years or develop a health problem that would prevent you from having lung cancer treatment.  Clinical breast exam.** / Every year after age 40 years.  BRCA-related cancer risk assessment.** / For women who have family members with a BRCA-related cancer (breast, ovarian, tubal, or peritoneal cancers).  Mammogram.** / Every year beginning at age 40 years and continuing for as long as you are in good health. Consult with your health care provider.  Pap test.** / Every 3 years starting at age 30 years  through age 65 or 70 years with 3 consecutive normal Pap tests. Testing can be stopped between 65 and 70 years with 3 consecutive normal Pap tests and no abnormal Pap or HPV tests in the past 10 years.  Fecal occult blood test (FOBT) of stool. / Every year beginning at age 50 years and continuing until age 75 years. You may not need to do this test if you get a colonoscopy every 10 years.  Flexible sigmoidoscopy or colonoscopy.** / Every 5 years for a flexible sigmoidoscopy or every 10 years for a colonoscopy beginning at age 50 years and continuing until age 75 years.  Hepatitis C blood test.** / For all people born from 1945 through 1965 and any individual with known risks for hepatitis C.  Osteoporosis screening.** / A one-time screening for women ages 65 years and over and women at risk for fractures or osteoporosis.  Skin self-exam. / Monthly.  Influenza vaccine. / Every year.  Tetanus, diphtheria, and acellular pertussis (Tdap/Td) vaccine.** / 1 dose of Td every 10 years.  Zoster vaccine.** / 1 dose for adults aged 60 years or older.  Pneumococcal 13-valent conjugate (PCV13) vaccine.** / Consult your health care provider.  Pneumococcal polysaccharide (PPSV23) vaccine.** / 1 dose for all adults aged 65 years and older. Screening for abdominal aortic aneurysm (AAA)  by ultrasound is recommended for people who have history of high blood pressure or who are current or former smokers. 

## 2015-07-07 NOTE — Progress Notes (Signed)
Patient ID: Jean Drake, female   DOB: 1946-02-08, 69 y.o.   MRN: [id number]   Comprehensive Examination  This very nice 69 y.o. MWF presents for complete physical.  Patient has been followed for HTN, Prediabetes, Hyperlipidemia, and Vitamin D Deficiency.    HTN predates many years and was finally startedin 2016.Marland Kitchen Patient's BP has been controlled at home and patient denies any cardiac symptoms as chest pain, palpitations, shortness of breath, dizziness or ankle swelling. Today's BP: 122/74 mmHg    Patient's hyperlipidemia is near controlled with diet.Last lipids were near goal - Cholesterol 209*; HDL 72; LDL 105*; Triglycerides 160 on 10/27/2014.   Patient is monitored expectantly for prediabetes and patient denies reactive hypoglycemic symptoms, visual blurring, diabetic polys, or paresthesias. Last A1c was 5.9% on 10/27/2014.   Finally, patient has history of Vitamin D Deficiency of "9" in 2008 and last Vitamin D was 61 on 10/27/2014.     Medication Sig  . ALPRAZolam (XANAX) 0.5 MG tablet Takes 1/2 tab prn  . amitriptyline (ELAVIL) 10 MG tablet Take 1 tablet (10 mg total) by mouth at bedtime.  Marland Kitchen aspirin 81 MG tablet Take 81 mg by mouth daily.  Marland Kitchen atenolol (TENORMIN) 50 MG tablet Take 1 tablet (50 mg total) by mouth daily.  . Cholecalciferol (VITAMIN D) 2000 UNITS tablet Take 2,000 Units by mouth daily. Takes 5 per day  . diazepam (VALIUM) 2 MG tablet Take 1 tablet (2 mg total) by mouth every 8 (eight) hours as needed for anxiety. (Patient not taking: Reported on 07/07/2015)  . estradiol (ESTRACE) 0.5 MG tablet Take 1 tablet daily  . fexofenadine (ALLEGRA) 180 MG tablet Take 180 mg by mouth daily.  Marland Kitchen FLAXSEED OIL  Take 1,400 mg by mouth daily.  Marland Kitchen MAGNESIUM OXIDE PO Take 250 mg by mouth daily.  . Multiple Vitamin Take 1 tablet by mouth daily.  . OCUVITE-LUTEIN Take 1 capsule by mouth daily.  Marland Kitchen omeprazole  40 MG capsule Take 40 mg by mouth daily.  . ondansetron  4 MG tablet Take 1 tablet (4  mg total) by mouth daily as needed for nausea or vomiting.   Allergies  Allergen Reactions  . Codeine   . Penicillins   . Sudafed [Pseudoephedrine Hcl]    No past medical history on file. Health Maintenance  Topic Date Due  . Hepatitis C Screening  12/13/1945  . TETANUS/TDAP  12/19/2013  . INFLUENZA VACCINE  06/13/2015  . PNA vac Low Risk Adult (2 of 2 - PPSV23) 10/05/2015  . MAMMOGRAM  02/13/2016  . COLONOSCOPY  06/12/2017  . DEXA SCAN  Completed  . ZOSTAVAX  Completed   Immunization History  Administered Date(s) Administered  . DTaP 09/12/2013  . Influenza Split 09/12/2013  . Influenza, High Dose Seasonal PF 10/04/2014  . Pneumococcal Conjugate-13 10/04/2014  . Pneumococcal Polysaccharide-23 03/29/2009  . Td 12/20/2003  . Zoster 06/07/2010   No past surgical history on file. No family history on file. Social History  Substance Use Topics  . Smoking status: Never Smoker   . Smokeless tobacco: None  . Alcohol Use: No    ROS Constitutional: Denies fever, chills, weight loss/gain, headaches, insomnia,  night sweats, and change in appetite. Does c/o fatigue. Eyes: Denies redness, blurred vision, diplopia, discharge, itchy, watery eyes.  ENT: Denies discharge, congestion, post nasal drip, epistaxis, sore throat, earache, hearing loss, dental pain, Tinnitus, Vertigo, Sinus pain, snoring.  Cardio: Denies chest pain, palpitations, irregular heartbeat, syncope, dyspnea, diaphoresis, orthopnea, PND, claudication, edema  Respiratory: denies cough, dyspnea, DOE, pleurisy, hoarseness, laryngitis, wheezing.  Gastrointestinal: Denies dysphagia, heartburn, reflux, water brash, pain, cramps, nausea, vomiting, bloating, diarrhea, constipation, hematemesis, melena, hematochezia, jaundice, hemorrhoids Genitourinary: Denies dysuria, frequency, urgency, nocturia, hesitancy, discharge, hematuria, flank pain Breast: Breast lumps, nipple discharge, bleeding.  Musculoskeletal: Denies arthralgia,  myalgia, stiffness, Jt. Swelling, pain, limp, and strain/sprain. Denies falls. Skin: Denies puritis, rash, hives, warts, acne, eczema, changing in skin lesion Neuro: No weakness, tremor, incoordination, spasms, paresthesia, pain Psychiatric: Denies confusion, memory loss, sensory loss. Denies Depression. Endocrine: Denies change in weight, skin, hair change, nocturia, and paresthesia, diabetic polys, visual blurring, hyper / hypo glycemic episodes.  Heme/Lymph: No excessive bleeding, bruising, enlarged lymph nodes.  Physical Exam  BP 122/74 mmHg  Pulse 64  Temp(Src) 97.7 F (36.5 C)  Resp 16  Ht 5' 1.5" (1.562 m)  Wt 142 lb 12.8 oz (64.774 kg)  BMI 26.55 kg/m2  General Appearance: Well nourished and in no apparent distress. Eyes: PERRLA, EOMs, conjunctiva no swelling or erythema, normal fundi and vessels. Sinuses: No frontal/maxillary tenderness ENT/Mouth: EACs patent / TMs  nl. Nares clear without erythema, swelling, mucoid exudates. Oral hygiene is good. No erythema, swelling, or exudate. Tongue normal, non-obstructing. Tonsils not swollen or erythematous. Hearing normal.  Neck: Supple, thyroid normal. No bruits, nodes or JVD. Respiratory: Respiratory effort normal.  BS equal and clear bilateral without rales, rhonci, wheezing or stridor. Cardio: Heart sounds are normal with regular rate and rhythm and no murmurs, rubs or gallops. Peripheral pulses are normal and equal bilaterally without edema. No aortic or femoral bruits. Chest: symmetric with normal excursions and percussion. Breasts: Symmetric, without lumps, nipple discharge, retractions, or fibrocystic changes.  Abdomen: Flat, soft, with bowel sounds. Nontender, no guarding, rebound, hernias, masses, or organomegaly.  Lymphatics: Non tender without lymphadenopathy.  Musculoskeletal: Full ROM all peripheral extremities, joint stability, 5/5 strength, and normal gait. Skin: Warm and dry without rashes, lesions, cyanosis, clubbing  or  ecchymosis.  Neuro: Cranial nerves intact, reflexes equal bilaterally. Normal muscle tone, no cerebellar symptoms. Sensation intact.  Pysch: Awake and oriented X 3, normal affect, Insight and Judgment appropriate.   Assessment and Plan  1. Essential hypertension  - Microalbumin / creatinine urine ratio - EKG 12-Lead - [place], RETROPERITNL ABD,  LTD - TSH  2. Hyperlipidemia  - Lipid panel  3. Prediabetes  - Hemoglobin A1c - Insulin, random  4. Vitamin D deficiency  - Vit D  25 hydroxy   5. Morbid obesity   6. GERD   7. Screening for rectal cancer  - POC Hemoccult Bld/Stl   8. Medication management  - Urine Microscopic - CBC with Differential/Platelet - BASIC METABOLIC PANEL WITH GFR - Hepatic function panel - Magnesium   Continue prudent diet as discussed, weight control, BP monitoring, regular exercise, and medications. Discussed med's effects and SE's. Screening labs and tests as requested with regular follow-up as recommended.  Over 40 minutes of exam, counseling, chart review was performed.

## 2015-07-08 LAB — URINALYSIS, MICROSCOPIC ONLY
BACTERIA UA: NONE SEEN [HPF]
CRYSTALS: NONE SEEN [HPF]
Casts: NONE SEEN [LPF]
RBC / HPF: NONE SEEN RBC/HPF (ref <=2)
Squamous Epithelial / LPF: NONE SEEN [HPF] (ref <=5)
WBC, UA: NONE SEEN WBC/HPF (ref <=5)
Yeast: NONE SEEN [HPF]

## 2015-07-08 LAB — MICROALBUMIN / CREATININE URINE RATIO: Creatinine, Urine: 24.7 mg/dL

## 2015-07-08 LAB — INSULIN, RANDOM: INSULIN: 8.2 u[IU]/mL (ref 2.0–19.6)

## 2015-07-08 LAB — VITAMIN D 25 HYDROXY (VIT D DEFICIENCY, FRACTURES): Vit D, 25-Hydroxy: 79 ng/mL (ref 30–100)

## 2015-07-27 ENCOUNTER — Other Ambulatory Visit: Payer: Self-pay

## 2015-07-27 DIAGNOSIS — Z1231 Encounter for screening mammogram for malignant neoplasm of breast: Secondary | ICD-10-CM

## 2015-08-03 ENCOUNTER — Other Ambulatory Visit: Payer: Self-pay | Admitting: *Deleted

## 2015-08-03 DIAGNOSIS — Z1212 Encounter for screening for malignant neoplasm of rectum: Secondary | ICD-10-CM

## 2015-08-03 LAB — POC HEMOCCULT BLD/STL (HOME/3-CARD/SCREEN)
Card #2 Fecal Occult Blod, POC: NEGATIVE
FECAL OCCULT BLD: NEGATIVE
Fecal Occult Blood, POC: NEGATIVE

## 2015-08-10 ENCOUNTER — Ambulatory Visit
Admission: RE | Admit: 2015-08-10 | Discharge: 2015-08-10 | Disposition: A | Discharge status: Home / Self Care | Payer: Medicare Other | Source: Ambulatory Visit

## 2015-08-10 DIAGNOSIS — Z1231 Encounter for screening mammogram for malignant neoplasm of breast: Secondary | ICD-10-CM

## 2015-09-27 ENCOUNTER — Other Ambulatory Visit: Payer: Self-pay | Admitting: Internal Medicine

## 2015-09-27 DIAGNOSIS — I10 Essential (primary) hypertension: Secondary | ICD-10-CM

## 2015-09-27 DIAGNOSIS — Z1212 Encounter for screening for malignant neoplasm of rectum: Secondary | ICD-10-CM

## 2015-10-11 ENCOUNTER — Encounter: Payer: Self-pay | Admitting: Internal Medicine

## 2015-10-11 ENCOUNTER — Ambulatory Visit (INDEPENDENT_AMBULATORY_CARE_PROVIDER_SITE_OTHER): Payer: Medicare Other | Admitting: Internal Medicine

## 2015-10-11 VITALS — BP 132/68 | HR 64 | Temp 98.0°F | Resp 16 | Ht 61.5 in | Wt 144.0 lb

## 2015-10-11 DIAGNOSIS — E559 Vitamin D deficiency, unspecified: Secondary | ICD-10-CM | POA: Diagnosis not present

## 2015-10-11 DIAGNOSIS — R7303 Prediabetes: Secondary | ICD-10-CM | POA: Diagnosis not present

## 2015-10-11 DIAGNOSIS — Z79899 Other long term (current) drug therapy: Secondary | ICD-10-CM

## 2015-10-11 DIAGNOSIS — I10 Essential (primary) hypertension: Secondary | ICD-10-CM | POA: Diagnosis not present

## 2015-10-11 DIAGNOSIS — E782 Mixed hyperlipidemia: Secondary | ICD-10-CM | POA: Diagnosis not present

## 2015-10-11 LAB — CBC WITH DIFFERENTIAL/PLATELET
BASOS ABS: 0.1 10*3/uL (ref 0.0–0.1)
BASOS PCT: 1 % (ref 0–1)
EOS ABS: 0.1 10*3/uL (ref 0.0–0.7)
EOS PCT: 2 % (ref 0–5)
HCT: 43.5 % (ref 36.0–46.0)
Hemoglobin: 14.5 g/dL (ref 12.0–15.0)
LYMPHS ABS: 1 10*3/uL (ref 0.7–4.0)
Lymphocytes Relative: 19 % (ref 12–46)
MCH: 29.4 pg (ref 26.0–34.0)
MCHC: 33.3 g/dL (ref 30.0–36.0)
MCV: 88.1 fL (ref 78.0–100.0)
MONOS PCT: 9 % (ref 3–12)
MPV: 9.1 fL (ref 8.6–12.4)
Monocytes Absolute: 0.5 10*3/uL (ref 0.1–1.0)
NEUTROS PCT: 69 % (ref 43–77)
Neutro Abs: 3.7 10*3/uL (ref 1.7–7.7)
PLATELETS: 295 10*3/uL (ref 150–400)
RBC: 4.94 MIL/uL (ref 3.87–5.11)
RDW: 13.1 % (ref 11.5–15.5)
WBC: 5.4 10*3/uL (ref 4.0–10.5)

## 2015-10-11 NOTE — Progress Notes (Signed)
Patient ID: Jean Drake, female   DOB: Dec 30, 1945, 69 y.o.   MRN: [id number]  Assessment and Plan:  Hypertension:  -Continue medication,  -monitor blood pressure at home.  -Continue DASH diet.   -Reminder to go to the ER if any CP, SOB, nausea, dizziness, severe HA, changes vision/speech, left arm numbness and tingling, and jaw pain.  Cholesterol: -Continue diet and exercise.  -Check cholesterol.   Pre-diabetes: -Continue diet and exercise.  -Check A1C  Vitamin D Def: -check level -continue medications.   Palpitations/SVT -take full tablet of atenolol as needed  -valsalva vs. Carotid massage  Continue diet and meds as discussed. Further disposition pending results of labs.  HPI 69 y.o. female  presents for 3 month follow up with hypertension, hyperlipidemia, prediabetes and vitamin D.   Her blood pressure has been controlled at home, today their BP is BP: 132/68 mmHg.   She does workout. She denies chest pain, shortness of breath, dizziness.   She is on cholesterol medication and denies myalgias. Her cholesterol is at goal. The cholesterol last visit was:   Lab Results  Component Value Date   CHOL 192 07/07/2015   HDL 66 07/07/2015   LDLCALC 99 07/07/2015   TRIG 134 07/07/2015   CHOLHDL 2.9 07/07/2015     She has been working on diet and exercise for prediabetes, and denies foot ulcerations, hyperglycemia, hypoglycemia , increased appetite, nausea, paresthesia of the feet, polydipsia, polyuria, visual disturbances, vomiting and weight loss. Last A1C in the office was:  Lab Results  Component Value Date   HGBA1C 5.5 07/07/2015    Patient is on Vitamin D supplement.  Lab Results  Component Value Date   VD25OH 79 07/07/2015      Current Medications:  Current Outpatient Prescriptions on File Prior to Visit  Medication Sig Dispense Refill  . ALPRAZolam (XANAX) 0.5 MG tablet Takes 1/2 tab prn 60 tablet 0  . aspirin 81 MG tablet Take 81 mg by mouth daily.    Marland Kitchen  atenolol (TENORMIN) 50 MG tablet Take 1 tablet (50 mg total) by mouth daily. 90 tablet 2  . Cholecalciferol (VITAMIN D) 2000 UNITS tablet Take 2,000 Units by mouth daily. Takes 5 per day    . estradiol (ESTRACE) 0.5 MG tablet Take 1 tablet daily 90 tablet 99  . fexofenadine (ALLEGRA) 180 MG tablet Take 180 mg by mouth daily.    . Flaxseed, Linseed, (FLAXSEED OIL PO) Take 1,400 mg by mouth daily.    Marland Kitchen MAGNESIUM OXIDE PO Take 250 mg by mouth daily.    . Multiple Vitamin (MULTIVITAMIN) tablet Take 1 tablet by mouth daily.    . multivitamin-lutein (OCUVITE-LUTEIN) CAPS capsule Take 1 capsule by mouth daily.    Marland Kitchen omeprazole (PRILOSEC) 40 MG capsule Take 40 mg by mouth daily.     No current facility-administered medications on file prior to visit.    Medical History: No past medical history on file.  Allergies:  Allergies  Allergen Reactions  . Codeine   . Penicillins   . Sudafed [Pseudoephedrine Hcl]      Review of Systems:  Review of Systems  Constitutional: Negative for fever, chills and malaise/fatigue.  HENT: Negative for congestion, ear pain and sore throat.   Respiratory: Negative for cough, shortness of breath and wheezing.   Cardiovascular: Positive for palpitations. Negative for chest pain and leg swelling.  Gastrointestinal: Negative for heartburn, abdominal pain, diarrhea, constipation, blood in stool and melena.  Genitourinary: Negative.   Skin: Negative.  Neurological: Negative for dizziness, sensory change and headaches.  Psychiatric/Behavioral: Negative for depression. The patient is not nervous/anxious and does not have insomnia.     Family history- Review and unchanged  Social history- Review and unchanged  Physical Exam: BP 132/68 mmHg  Pulse 64  Temp(Src) 98 F (36.7 C) (Temporal)  Resp 16  Ht 5' 1.5" (1.562 m)  Wt 144 lb (65.318 kg)  BMI 26.77 kg/m2 Wt Readings from Last 3 Encounters:  10/11/15 144 lb (65.318 kg)  07/07/15 142 lb 12.8 oz (64.774 kg)   11/18/14 149 lb 6.4 oz (67.767 kg)    General Appearance: Well nourished well developed, in no apparent distress. Eyes: PERRLA, EOMs, conjunctiva no swelling or erythema ENT/Mouth: Ear canals normal without obstruction, swelling, erythma, discharge.  TMs normal bilaterally.  Oropharynx moist, clear, without exudate, or postoropharyngeal swelling. Neck: Supple, thyroid normal,no cervical adenopathy  Respiratory: Respiratory effort normal, Breath sounds clear A&P without rhonchi, wheeze, or rale.  No retractions, no accessory usage. Cardio: RRR with no MRGs. Brisk peripheral pulses without edema.  Abdomen: Soft, + BS,  Non tender, no guarding, rebound, hernias, masses. Musculoskeletal: Full ROM, 5/5 strength, Normal gait Skin: Warm, dry without rashes, lesions, ecchymosis.  Neuro: Awake and oriented X 3, Cranial nerves intact. Normal muscle tone, no cerebellar symptoms. Psych: Normal affect, Insight and Judgment appropriate.    Terri Piedraourtney Forcucci, PA-C 10:58 AM [facility]

## 2015-10-12 LAB — HEPATIC FUNCTION PANEL
ALBUMIN: 4 g/dL (ref 3.6–5.1)
ALT: 17 U/L (ref 6–29)
AST: 25 U/L (ref 10–35)
Alkaline Phosphatase: 69 U/L (ref 33–130)
BILIRUBIN DIRECT: 0.1 mg/dL (ref <=0.2)
BILIRUBIN TOTAL: 0.3 mg/dL (ref 0.2–1.2)
Indirect Bilirubin: 0.2 mg/dL (ref 0.2–1.2)
Total Protein: 6.8 g/dL (ref 6.1–8.1)

## 2015-10-12 LAB — BASIC METABOLIC PANEL WITH GFR
BUN: 18 mg/dL (ref 7–25)
CO2: 27 mmol/L (ref 20–31)
CREATININE: 0.87 mg/dL (ref 0.50–0.99)
Calcium: 9.8 mg/dL (ref 8.6–10.4)
Chloride: 105 mmol/L (ref 98–110)
GFR, EST AFRICAN AMERICAN: 79 mL/min (ref >=60)
GFR, Est Non African American: 68 mL/min (ref >=60)
Glucose, Bld: 90 mg/dL (ref 65–99)
Potassium: 4.8 mmol/L (ref 3.5–5.3)
SODIUM: 140 mmol/L (ref 135–146)

## 2015-10-12 LAB — TSH: TSH: 1.193 u[IU]/mL (ref 0.350–4.500)

## 2015-10-31 ENCOUNTER — Other Ambulatory Visit: Payer: Self-pay | Admitting: *Deleted

## 2015-10-31 MED ORDER — ATENOLOL 50 MG PO TABS: 50.0000 mg | ORAL_TABLET | Freq: Every day | ORAL | Status: DC

## 2015-11-15 ENCOUNTER — Encounter: Payer: Self-pay | Admitting: Internal Medicine

## 2015-11-15 ENCOUNTER — Ambulatory Visit (INDEPENDENT_AMBULATORY_CARE_PROVIDER_SITE_OTHER): Payer: Medicare Other | Admitting: Internal Medicine

## 2015-11-15 VITALS — BP 140/72 | HR 80 | Temp 98.0°F | Resp 16 | Ht 61.5 in

## 2015-11-15 DIAGNOSIS — I471 Supraventricular tachycardia, unspecified: Secondary | ICD-10-CM

## 2015-11-15 DIAGNOSIS — J069 Acute upper respiratory infection, unspecified: Secondary | ICD-10-CM | POA: Diagnosis not present

## 2015-11-15 HISTORY — DX: Supraventricular tachycardia, unspecified: I47.10

## 2015-11-15 HISTORY — DX: Supraventricular tachycardia: I47.1

## 2015-11-15 MED ORDER — PREDNISONE 20 MG PO TABS: ORAL_TABLET | ORAL | Status: DC

## 2015-11-15 MED ORDER — PROMETHAZINE-DM 6.25-15 MG/5ML PO SYRP: ORAL_SOLUTION | ORAL | Status: DC

## 2015-11-15 MED ORDER — AZITHROMYCIN 250 MG PO TABS: ORAL_TABLET | ORAL | Status: DC

## 2015-11-15 MED ORDER — BENZONATATE 200 MG PO CAPS: 200.0000 mg | ORAL_CAPSULE | Freq: Three times a day (TID) | ORAL | Status: DC | PRN

## 2015-11-15 NOTE — Progress Notes (Signed)
Patient ID: Jean Drake, female   DOB: [birth year], 70 y.o.   MRN: [id number]  HPI  Patient presents to the office for evaluation of cough.  It has been going on for 1 weeks.  Patient reports night > day, wet, barky, worse with lying down.  They also endorse change in voice, chills, postnasal drip, sputum production and nasal congestion, sinus pressure, ear pain, scratchy throat. .  They have tried alka seltzer plus, cough drops, lemon juice, honey.  They report that nothing has worked.  They admits to other sick contacts.  Patient reports that she did have to got [place] and she was hospitalized in the ER and was given    Review of Systems  Constitutional: Positive for chills and malaise/fatigue. Negative for fever.  HENT: Positive for congestion, ear pain and sore throat.   Eyes: Negative.   Respiratory: Positive for cough, sputum production and shortness of breath. Negative for wheezing.   Cardiovascular: Negative for chest pain, palpitations and leg swelling.  Neurological: Negative for headaches.    PE:  Filed Vitals:   11/15/15 1444  BP: 140/72  Pulse: 80  Temp: 98 F (36.7 C)  Resp: 16   General:  Alert and non-toxic, WDWN, NAD HEENT: NCAT, PERLA, EOM normal, no occular discharge or erythema.  Nasal mucosal edema with sinus tenderness to palpation.  Oropharynx clear with minimal oropharyngeal edema and erythema.  Mucous membranes moist and pink. Neck:  Cervical adenopathy Chest:  RRR no MRGs.  Lungs clear to auscultation A&P with no wheezes rhonchi or rales.   Abdomen: +BS x 4 quadrants, soft, non-tender, no guarding, rigidity, or rebound. Skin: warm and dry no rash Neuro: A&Ox4, CN II-XII grossly intact  Assessment and Plan:   1. SVT (supraventricular tachycardia) (HCC) -atenolol 25 mg daily and if palpaitations can take other 25 mg - Ambulatory referral to Cardiology  2. Acute URI -zpak -prednisone -allegra -nasal saline -flonase 2  sprays per nostril

## 2015-11-15 NOTE — Patient Instructions (Signed)
Please continue the atenolol the way you have been taking it.  If you have a bad spell of palpitations you can always take the other half of the tablet.    You should hear from Katrina in 1 week about your appointment with cardiology.  Please use flonase, rhinocort, or nasacort 2 sprays per nostril before bedtime to help with congestion.  Tessalon is non-sedating cough medication.  Phenergan dextromethorphan syrup can be sedating and is best used at night time.   Cont allegra.  Please take zpak until gone.  Please use prednisone until gone.    Use nasal saline.

## 2015-12-02 ENCOUNTER — Encounter: Payer: Self-pay | Admitting: *Deleted

## 2015-12-09 NOTE — Progress Notes (Signed)
Patient ID: Jean Drake, female   DOB: 14-Nov-1945, 70 y.o.   MRN: [id number]     Cardiology Office Note   Date:  12/15/2015   ID:  JARIYAH HACKLEY, DOB 1946/04/03, MRN [id number]  PCP:  Nadean Corwin, MD  Cardiologist:   Charlton Haws, MD   Chief Complaint  Patient presents with  . Establish Care    no sx      History of Present Illness: Jean Drake is a 70 y.o. female who presents for CRF;s  HTN, elevated lipids and borderline glucose.  History of SVT.  02/2011 reviewed notes from Dr Evern Bio ED.  SVT rate 210 in field Rx by EMS With adenosine and converted prior to arriving in ER.  ECG in ER only ST rate 110 normal QRS no WPW.    Reviewed more recent notes from [facility] on 11/05/15 with similar episode of SVT converted to adenosine  Rate 164  Labs normal including TSH 1.86 Troponin negative D dimer negative   Gets infrequent palpitations Some lasting 8-10 minutes She can valsalva and breath slowly and episode Passes No chest pain syncope or dyspnea with it      Past Medical History  Diagnosis Date  . SVT (supraventricular tachycardia) (HCC) 11/15/2015  . Migraine, unspecified, without mention of intractable migraine without mention of status migrainosus 10/20/2013  . Hyperlipidemia 10/20/2013  . Essential hypertension 10/20/2013  . Vitamin D deficiency 10/20/2013  . Prediabetes 10/20/2013  . Morbid obesity (HCC) 11/18/2014  . GERD 10/20/2013  . 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 No pertinent past surgical history.   Current Outpatient Prescriptions  Medication Sig Dispense Refill  . ALPRAZolam (XANAX) 0.5 MG tablet Take 0.25 mg by mouth as directed.    Marland Kitchen aspirin 81 MG tablet Take 81 mg by mouth daily.    Marland Kitchen atenolol (TENORMIN) 50 MG tablet Take 25 mg by mouth daily.    . benzonatate (TESSALON) 200 MG capsule Take 1 capsule (200 mg total) by mouth 3 (three) times daily as needed for cough. 30 capsule 1  . Cholecalciferol (VITAMIN D) 2000 UNITS tablet Take  2,000 Units by mouth daily.     Marland Kitchen estradiol (ESTRACE) 0.5 MG tablet Take 0.5 mg by mouth daily.    . fexofenadine (ALLEGRA) 180 MG tablet Take 180 mg by mouth daily.    . Flaxseed, Linseed, (FLAXSEED OIL PO) Take 1,400 mg by mouth daily.    Marland Kitchen MAGNESIUM OXIDE PO Take 250 mg by mouth daily.    . Multiple Vitamin (MULTIVITAMIN) tablet Take 1 tablet by mouth daily.    . multivitamin-lutein (OCUVITE-LUTEIN) CAPS capsule Take 1 capsule by mouth daily.    Marland Kitchen omeprazole (PRILOSEC) 40 MG capsule Take 40 mg by mouth daily.    . promethazine-dextromethorphan (PROMETHAZINE-DM) 6.25-15 MG/5ML syrup Take 5-10 mLs by mouth every 8 (eight) hours as needed for cough.     No current facility-administered medications for this visit.    Allergies:   Penicillins; Codeine; and Sudafed    Social History:  The patient  reports that she has never smoked. She does not have any smokeless tobacco history on file. She reports that she does not drink alcohol.   Family History:  The patient's family history includes Heart disease in her mother.    ROS:  Please see the history of present illness.   Otherwise, review of systems are positive for none.   All other systems are reviewed and negative.    PHYSICAL  EXAM: VS:  BP 120/60 mmHg  Pulse 71  Ht  (1.549 m)  Wt 66.28 kg (146 lb 1.9 oz)  BMI 27.62 kg/m2  SpO2 97% , BMI Body mass index is 27.62 kg/(m^2). Affect appropriate Healthy:  appears stated age HEENT: normal Neck supple with no adenopathy JVP normal no bruits no thyromegaly Lungs clear with no wheezing and good diaphragmatic motion Heart:  S1/S2 no murmur, no rub, gallop or click PMI normal Abdomen: benighn, BS positve, no tenderness, no AAA no bruit.  No HSM or HJR Distal pulses intact with no bruits No edema Neuro non-focal Skin warm and dry No muscular weakness    EKG:   07/07/15  SR rate 62 normal no pre excitation normal QT no change from 04/20/14    Recent Labs: 07/07/2015: Magnesium  2.1 10/11/2015: ALT 17; BUN 18; Creat 0.87; Hemoglobin 14.5; Platelets 295; Potassium 4.8; Sodium 140; TSH 1.193    Lipid Panel    Component Value Date/Time   CHOL 192 07/07/2015 1242   TRIG 134 07/07/2015 1242   HDL 66 07/07/2015 1242   CHOLHDL 2.9 07/07/2015 1242   VLDL 27 07/07/2015 1242   LDLCALC 99 07/07/2015 1242      Wt Readings from Last 3 Encounters:  12/15/15 66.28 kg (146 lb 1.9 oz)  10/11/15 65.318 kg (144 lb)  07/07/15 64.774 kg (142 lb 12.8 oz)      Other studies Reviewed: Additional studies/ records that were reviewed today include: [facility] ER notes 11/05/15 and Epic notes .    ASSESSMENT AND PLAN:  1.  SVT:  Discussed options of medical Rx, watchful waiting and ablation.  Will order echo to r/o structural heart disease.  Refer to EP To further discuss ablation I would continue beta blocker Rx for now and offer ablation if symptoms increase in frequency as she has Only had 2 ER visits in 4 years   Current medicines are reviewed at length with the patient today.  The patient does not have concerns regarding medicines.  The following changes have been made:  no change  Labs/ tests ordered today include: Echo  No orders of the defined types were placed in this encounter.     Disposition:   FU with EP to discuss ablaiton      Signed, Charlton Haws, MD  12/15/2015 11:13 AM    [facility] [address] [place], [place], [place]  [address] Phone: [phone]; Fax: [phone]

## 2015-12-13 ENCOUNTER — Encounter: Payer: Self-pay | Admitting: Cardiovascular Disease

## 2015-12-15 ENCOUNTER — Ambulatory Visit (INDEPENDENT_AMBULATORY_CARE_PROVIDER_SITE_OTHER): Payer: Medicare Other | Admitting: Cardiovascular Disease

## 2015-12-15 ENCOUNTER — Encounter: Payer: Self-pay | Admitting: Cardiovascular Disease

## 2015-12-15 VITALS — BP 120/60 | HR 71 | Ht 61.0 in | Wt 146.1 lb

## 2015-12-15 DIAGNOSIS — Z7689 Persons encountering health services in other specified circumstances: Secondary | ICD-10-CM

## 2015-12-15 DIAGNOSIS — I471 Supraventricular tachycardia: Secondary | ICD-10-CM | POA: Diagnosis not present

## 2015-12-15 DIAGNOSIS — Z7189 Other specified counseling: Secondary | ICD-10-CM | POA: Diagnosis not present

## 2015-12-15 NOTE — Patient Instructions (Addendum)
Medication Instructions:  Your physician recommends that you continue on your current medications as directed. Please refer to the Current Medication list given to you today.  Labwork: NONE  Testing/Procedures: Your physician has requested that you have an echocardiogram. Echocardiography is a painless test that uses sound waves to create images of your heart. It provides your doctor with information about the size and shape of your heart and how well your heart's chambers and valves are working. This procedure takes approximately one hour. There are no restrictions for this procedure.  Follow-Up: Your physician recommends that you schedule a follow-up appointment with EP.    If you need a refill on your cardiac medications before your next appointment, please call your pharmacy.

## 2015-12-20 ENCOUNTER — Ambulatory Visit (INDEPENDENT_AMBULATORY_CARE_PROVIDER_SITE_OTHER): Payer: Medicare Other | Admitting: Cardiology

## 2015-12-20 ENCOUNTER — Encounter: Payer: Self-pay | Admitting: Cardiology

## 2015-12-20 VITALS — BP 122/76 | HR 68 | Ht 61.0 in | Wt 145.8 lb

## 2015-12-20 DIAGNOSIS — I471 Supraventricular tachycardia: Secondary | ICD-10-CM

## 2015-12-20 NOTE — Progress Notes (Signed)
Electrophysiology Office Note   Date:  12/20/2015   ID:  Jean Drake, DOB 11/29/1945, MRN [id number]  PCP:  Jean Corwin, MD  Cardiologist:  Jean Drake Primary Electrophysiologist:  Jean Simm Jorja Loa, MD    Chief Complaint  Patient presents with  . Advice Only     History of Present Illness: Jean Drake is a 70 y.o. female who presents today for electrophysiology evaluation.   She has a history of hypertension, hyperlipidemia, morbid obesity, prediabetes, presents to clinic today for workup of SVT.  She had one episode of SVT in 2012 which converted with adenosine at a heart rate of 210, and a second in December 2016 the converted with adenosine with a rate of 164. She does get infrequent palpitations. She can use the Valsalva maneuver and the episode passes. She says that she has not had any issues with tachycardia other than the one in 2012 and in December. She takes atenolol daily and an extra dose of atenolol when she has palpitations.  Today, she denies symptoms of palpitations, chest pain, shortness of breath, orthopnea, PND, lower extremity edema, claudication, dizziness, presyncope, syncope, bleeding, or neurologic sequela. The patient is tolerating medications without difficulties and is otherwise without complaint today.  Past Medical History  Diagnosis Date  . SVT (supraventricular tachycardia) (HCC) 11/15/2015  . Migraine, unspecified, without mention of intractable migraine without mention of status migrainosus 10/20/2013  . Hyperlipidemia 10/20/2013  . Essential hypertension 10/20/2013  . Vitamin D deficiency 10/20/2013  . Prediabetes 10/20/2013  . Morbid obesity (HCC) 11/18/2014  . GERD 10/20/2013  . 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 No past surgeries       Current Outpatient Prescriptions  Medication Sig Dispense Refill  . ALPRAZolam (XANAX) 0.5 MG tablet Take 0.25 mg by mouth as directed.    Marland Kitchen aspirin 81 MG  tablet Take 81 mg by mouth daily.    Marland Kitchen atenolol (TENORMIN) 50 MG tablet Take 25 mg by mouth daily.    . Cholecalciferol (VITAMIN D) 2000 UNITS tablet Take 2,000 Units by mouth daily.     Marland Kitchen estradiol (ESTRACE) 0.5 MG tablet Take 0.5 mg by mouth daily.    . fexofenadine (ALLEGRA) 180 MG tablet Take 180 mg by mouth daily.    . Flaxseed, Linseed, (FLAXSEED OIL PO) Take 1,400 mg by mouth daily.    Marland Kitchen MAGNESIUM OXIDE PO Take 250 mg by mouth daily.    . Multiple Vitamin (MULTIVITAMIN) tablet Take 1 tablet by mouth daily.    . multivitamin-lutein (OCUVITE-LUTEIN) CAPS capsule Take 1 capsule by mouth daily.    Marland Kitchen omeprazole (PRILOSEC) 40 MG capsule Take 40 mg by mouth daily as needed.      No current facility-administered medications for this visit.    Allergies:   Penicillins; Codeine; and Sudafed   Social History:  The patient  reports that she has never smoked. She does not have any smokeless tobacco history on file. She reports that she does not drink alcohol.   Family History:  The patient's family history includes Heart disease in her mother.    ROS:  Please see the history of present illness. All other systems are reviewed and negative.    PHYSICAL EXAM: VS:  BP 122/76 mmHg  Pulse 68  Ht  (1.549 m)  Wt 145 lb 12.8 oz (66.134 kg)  BMI 27.56 kg/m2 , BMI Body mass index is 27.56 kg/(m^2). GEN: Well nourished, well developed,  in no acute distress HEENT: normal Neck: no JVD, carotid bruits, or masses Cardiac: RRR; no murmurs, rubs, or gallops,no edema  Respiratory:  clear to auscultation bilaterally, normal work of breathing GI: soft, nontender, nondistended, + BS MS: no deformity or atrophy Skin: warm and dry Neuro:  Strength and sensation are intact Psych: euthymic mood, full affect  EKG:  EKG is ordered today. The ekg ordered today shows sinus rhythm, rate 68  Recent Labs: 07/07/2015: Magnesium 2.1 10/11/2015: ALT 17; BUN 18; Creat 0.87; Hemoglobin 14.5; Platelets 295;  Potassium 4.8; Sodium 140; TSH 1.193    Lipid Panel     Component Value Date/Time   CHOL 192 07/07/2015 1242   TRIG 134 07/07/2015 1242   HDL 66 07/07/2015 1242   CHOLHDL 2.9 07/07/2015 1242   VLDL 27 07/07/2015 1242   LDLCALC 99 07/07/2015 1242     Wt Readings from Last 3 Encounters:  12/20/15 145 lb 12.8 oz (66.134 kg)  12/15/15 146 lb 1.9 oz (66.28 kg)  10/11/15 144 lb (65.318 kg)    ASSESSMENT AND PLAN:  1.  SVT: Unfortunately we do not have evidence of her SVT in the EKGs that are standard Epic. She does have a normal PR interval which would make WPW syndrome highly unlikely. 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 She currently takes daily atenolol. At this point she does not wish to have an ablation done, as she feels that her palpitations are not often enough. We Jean Drake continue her on her atenolol, and have told her that she can take an extra atenolol as she needs. She Jean Drake call back if she has any further issues or changes her mind about ablation.9  Current medicines are reviewed at length with the patient today.   The patient does not have concerns regarding her medicines.  The following changes were made today:  none  Labs/ tests ordered today include:  No orders of the defined types were placed in this encounter.     Disposition:   FU with Jean Drake PRN  Signed, Jean Ueda Jorja Loa, MD  12/20/2015 1:57 PM     [facility] [address] [place] [address] [phone] (office) [phone] (fax)

## 2015-12-20 NOTE — Patient Instructions (Signed)
Medication Instructions:  Your physician recommends that you continue on your current medications as directed. Please refer to the Current Medication list given to you today.  Lab work: None ordered  Testing/Procedures: None ordered  Follow-Up: No follow up is needed at this time with Dr. Camnitz.  He will see you on an as needed basis.   Thank you for choosing [facility]!!   Sherri Price, RN [phone]     

## 2015-12-22 ENCOUNTER — Other Ambulatory Visit: Payer: Self-pay

## 2015-12-22 ENCOUNTER — Ambulatory Visit (HOSPITAL_COMMUNITY): Payer: Medicare Other | Attending: Cardiology

## 2015-12-22 DIAGNOSIS — I10 Essential (primary) hypertension: Secondary | ICD-10-CM | POA: Diagnosis not present

## 2015-12-22 DIAGNOSIS — Z7689 Persons encountering health services in other specified circumstances: Secondary | ICD-10-CM

## 2015-12-22 DIAGNOSIS — I340 Nonrheumatic mitral (valve) insufficiency: Secondary | ICD-10-CM | POA: Diagnosis not present

## 2015-12-22 DIAGNOSIS — E785 Hyperlipidemia, unspecified: Secondary | ICD-10-CM | POA: Insufficient documentation

## 2015-12-22 DIAGNOSIS — I471 Supraventricular tachycardia: Secondary | ICD-10-CM | POA: Diagnosis not present

## 2015-12-22 DIAGNOSIS — Z7189 Other specified counseling: Secondary | ICD-10-CM

## 2015-12-22 DIAGNOSIS — I517 Cardiomegaly: Secondary | ICD-10-CM | POA: Diagnosis not present

## 2015-12-22 DIAGNOSIS — I071 Rheumatic tricuspid insufficiency: Secondary | ICD-10-CM | POA: Insufficient documentation

## 2016-02-14 ENCOUNTER — Encounter: Payer: Self-pay | Admitting: Internal Medicine

## 2016-02-14 ENCOUNTER — Ambulatory Visit (INDEPENDENT_AMBULATORY_CARE_PROVIDER_SITE_OTHER): Payer: Medicare Other | Admitting: Internal Medicine

## 2016-02-14 VITALS — BP 120/74 | HR 68 | Temp 97.5°F | Resp 16 | Ht 61.5 in | Wt 147.6 lb

## 2016-02-14 DIAGNOSIS — I10 Essential (primary) hypertension: Secondary | ICD-10-CM

## 2016-02-14 DIAGNOSIS — E559 Vitamin D deficiency, unspecified: Secondary | ICD-10-CM | POA: Diagnosis not present

## 2016-02-14 DIAGNOSIS — E782 Mixed hyperlipidemia: Secondary | ICD-10-CM | POA: Diagnosis not present

## 2016-02-14 DIAGNOSIS — R7303 Prediabetes: Secondary | ICD-10-CM | POA: Diagnosis not present

## 2016-02-14 DIAGNOSIS — Z79899 Other long term (current) drug therapy: Secondary | ICD-10-CM | POA: Diagnosis not present

## 2016-02-14 DIAGNOSIS — R7309 Other abnormal glucose: Secondary | ICD-10-CM | POA: Diagnosis not present

## 2016-02-14 LAB — HEMOGLOBIN A1C
HEMOGLOBIN A1C: 5.6 % (ref <5.7)
MEAN PLASMA GLUCOSE: 114 mg/dL

## 2016-02-14 LAB — CBC WITH DIFFERENTIAL/PLATELET
BASOS PCT: 1 %
Basophils Absolute: 53 cells/uL (ref 0–200)
EOS ABS: 159 {cells}/uL (ref 15–500)
Eosinophils Relative: 3 %
HEMATOCRIT: 42.9 % (ref 35.0–45.0)
HEMOGLOBIN: 14.1 g/dL (ref 11.7–15.5)
LYMPHS ABS: 742 {cells}/uL — AB (ref 850–3900)
Lymphocytes Relative: 14 %
MCH: 28.8 pg (ref 27.0–33.0)
MCHC: 32.9 g/dL (ref 32.0–36.0)
MCV: 87.6 fL (ref 80.0–100.0)
MONO ABS: 689 {cells}/uL (ref 200–950)
MPV: 9.3 fL (ref 7.5–12.5)
Monocytes Relative: 13 %
NEUTROS ABS: 3657 {cells}/uL (ref 1500–7800)
Neutrophils Relative %: 69 %
PLATELETS: 253 10*3/uL (ref 140–400)
RBC: 4.9 MIL/uL (ref 3.80–5.10)
RDW: 13.7 % (ref 11.0–15.0)
WBC: 5.3 10*3/uL (ref 3.8–10.8)

## 2016-02-14 LAB — HEPATIC FUNCTION PANEL
ALBUMIN: 4.2 g/dL (ref 3.6–5.1)
ALK PHOS: 72 U/L (ref 33–130)
ALT: 16 U/L (ref 6–29)
AST: 26 U/L (ref 10–35)
BILIRUBIN TOTAL: 0.3 mg/dL (ref 0.2–1.2)
Bilirubin, Direct: 0.1 mg/dL (ref <=0.2)
Indirect Bilirubin: 0.2 mg/dL (ref 0.2–1.2)
TOTAL PROTEIN: 6.5 g/dL (ref 6.1–8.1)

## 2016-02-14 LAB — BASIC METABOLIC PANEL WITH GFR
BUN: 16 mg/dL (ref 7–25)
CHLORIDE: 101 mmol/L (ref 98–110)
CO2: 28 mmol/L (ref 20–31)
Calcium: 9.5 mg/dL (ref 8.6–10.4)
Creat: 1.07 mg/dL — ABNORMAL HIGH (ref 0.50–0.99)
GFR, Est African American: 61 mL/min (ref >=60)
GFR, Est Non African American: 53 mL/min — ABNORMAL LOW (ref >=60)
Glucose, Bld: 90 mg/dL (ref 65–99)
POTASSIUM: 4.8 mmol/L (ref 3.5–5.3)
Sodium: 140 mmol/L (ref 135–146)

## 2016-02-14 LAB — LIPID PANEL
CHOLESTEROL: 184 mg/dL (ref 125–200)
HDL: 67 mg/dL (ref >=46)
LDL CALC: 97 mg/dL (ref <130)
TRIGLYCERIDES: 102 mg/dL (ref <150)
Total CHOL/HDL Ratio: 2.7 Ratio (ref <=5.0)
VLDL: 20 mg/dL (ref <30)

## 2016-02-14 LAB — TSH: TSH: 1.11 mIU/L

## 2016-02-14 LAB — MAGNESIUM: Magnesium: 2 mg/dL (ref 1.5–2.5)

## 2016-02-14 NOTE — Progress Notes (Signed)
Patient ID: Jean Drake, female   DOB: April 18, 1946, 70 y.o.   MRN: [id number]   This very nice 70 y.o. MWF presents for  follow up with hx/o Labile Hypertension, Hyperlipidemia, Pre-Diabetes and Vitamin D Deficiency. Patient also has GERD which is controlled with diet with only occasional to infrequent use of her Omeprazole.    Patient is monitored expectantly for HTN & BP has been controlled at home. Today's BP: 120/74 mmHg. Patient has had no complaints of any cardiac type chest pain, palpitations, dyspnea/orthopnea/PND, dizziness, claudication, or dependent edema.   Hyperlipidemia is controlled with diet.  Last Lipids were at goal with Cholesterol 192; HDL 66; LDL 99; Triglycerides 134 on 07/07/2015.   Also, the patient is followed proactively and screened proactively for PreDiabetes and has had no symptoms of reactive hypoglycemia, diabetic polys, paresthesias or visual blurring.  Current A1c is 5.6%.    Further, the patient also has history of Vitamin D Deficiency of "9" in 2008 and supplements vitamin D without any suspected side-effects. Last vitamin D was  79 on 07/07/2015.  Medication Sig  . ALPRAZolam  0.5 MG Take 0.25 mg by mouth as directed.  Marland Kitchen aspirin 81 MG tablet Take 81 mg by mouth daily.  Marland Kitchen atenolol  50 MG Take 25 mg by mouth daily.  Marland Kitchen VITAMIN D 2000 UNITS  Take 2,000 Units by mouth daily.   Marland Kitchen estradiol  0.5 MG  Take 0.5 mg by mouth daily.  . fexofenadine180 MG Take 180 mg by mouth daily.  Marland Kitchen FLAXSEED OIL  Take 1,400 mg by mouth daily.  Marland Kitchen MAGNESIUM  Take 250 mg by mouth daily.  . Multiple Vitamin  Take 1 tablet by mouth daily.  . OCUVITE-LUTEIN  Take 1 capsule by mouth daily.  Marland Kitchen omeprazole  40 MG  Take 40 mg by mouth daily as needed.    Allergies  Allergen Reactions  . Penicillins Anaphylaxis  . Codeine Nausea And Vomiting  . Sudafed [Pseudoephedrine Hcl]     Makes heart race, per pt    PMHx:   Past Medical History  Diagnosis Date  . SVT (supraventricular tachycardia)  (HCC) 11/15/2015  . Migraine, unspecified, without mention of intractable migraine without mention of status migrainosus 10/20/2013  . Hyperlipidemia 10/20/2013  . Essential hypertension 10/20/2013  . Vitamin D deficiency 10/20/2013  . Prediabetes 10/20/2013  . Morbid obesity (HCC) 11/18/2014  . GERD 10/20/2013  . 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 DTaP 09/12/2013  . Influenza Split 09/12/2013  . Influenza, High Dose Seasonal PF 10/04/2014  . Pneumococcal Conjugate-13 10/04/2014  . Pneumococcal Polysaccharide-23 03/29/2009  . Td 12/20/2003  . Tdap 09/12/2013  . Zoster 06/07/2010   Past Surgical History  Procedure Laterality Date  . No past surgeries     FHx:    Reviewed / unchanged  SHx:    Reviewed / unchanged  Systems Review:  Constitutional: Denies fever, chills, wt changes, headaches, insomnia, fatigue, night sweats, change in appetite. Eyes: Denies redness, blurred vision, diplopia, discharge, itchy, watery eyes.  ENT: Denies discharge, congestion, post nasal drip, epistaxis, sore throat, earache, hearing loss, dental pain, tinnitus, vertigo, sinus pain, snoring.  CV: Denies chest pain, palpitations, irregular heartbeat, syncope, dyspnea, diaphoresis, orthopnea, PND, claudication or edema. Respiratory: denies cough, dyspnea, DOE, pleurisy, hoarseness, laryngitis, wheezing.  Gastrointestinal: Denies dysphagia, odynophagia, heartburn, reflux, water brash, abdominal pain or cramps, nausea, vomiting, bloating, diarrhea, constipation, hematemesis, melena, hematochezia  or hemorrhoids. Genitourinary: Denies dysuria, frequency,  urgency, nocturia, hesitancy, discharge, hematuria or flank pain. Musculoskeletal: Denies arthralgias, myalgias, stiffness, jt. swelling, pain, limping or strain/sprain.  Skin: Denies pruritus, rash, hives, warts, acne, eczema or change in skin lesion(s). Neuro: No weakness, tremor, incoordination, spasms,  paresthesia or pain. Psychiatric: Denies confusion, memory loss or sensory loss. Endo: Denies change in weight, skin or hair change.  Heme/Lymph: No excessive bleeding, bruising or enlarged lymph nodes.  Physical Exam  BP 120/74 mmHg  Pulse 68  Temp(Src) 97.5 F (36.4 C)  Resp 16  Ht 5' 1.5" (1.562 m)  Wt 147 lb 9.6 oz (66.951 kg)  BMI 27.44 kg/m2  Appears well nourished and in no distress. Eyes: PERRLA, EOMs, conjunctiva no swelling or erythema. Sinuses: No frontal/maxillary tenderness ENT/Mouth: EAC's clear, TM's nl w/o erythema, bulging. Nares clear w/o erythema, swelling, exudates. Oropharynx clear without erythema or exudates. Oral hygiene is good. Tongue normal, non obstructing. Hearing intact.  Neck: Supple. Thyroid nl. Car 2+/2+ without bruits, nodes or JVD. Chest: Respirations nl with BS clear & equal w/o rales, rhonchi, wheezing or stridor.  Cor: Heart sounds normal w/ regular rate and rhythm without sig. murmurs, gallops, clicks, or rubs. Peripheral pulses normal and equal  without edema.  Abdomen: Soft & bowel sounds normal. Non-tender w/o guarding, rebound, hernias, masses, or organomegaly.  Lymphatics: Unremarkable.  Musculoskeletal: Full ROM all peripheral extremities, joint stability, 5/5 strength, and normal gait.  Skin: Warm, dry without exposed rashes, lesions or ecchymosis apparent.  Neuro: Cranial nerves intact, reflexes equal bilaterally. Sensory-motor testing grossly intact. Tendon reflexes grossly intact.  Pysch: Alert & oriented x 3.  Insight and judgement nl & appropriate. No ideations.  Assessment and Plan:  1. Essential hypertension  - TSH  2. Hyperlipidemia  - Lipid panel - TSH  3. Prediabetes  - Hemoglobin A1c - Insulin, random  4. Vitamin D deficiency  - VITAMIN D 25 Hydroxy 5. Medication management  - CBC with Differential/Platelet - BASIC METABOLIC PANEL WITH GFR - Hepatic function panel - Magnesium   Recommended regular  exercise, BP monitoring, weight control, and discussed med and SE's. Recommended labs to assess and monitor clinical status. Further disposition pending results of labs. Over 30 minutes of exam, counseling, chart review was performed

## 2016-02-14 NOTE — Patient Instructions (Signed)

## 2016-02-15 LAB — VITAMIN D 25 HYDROXY (VIT D DEFICIENCY, FRACTURES): Vit D, 25-Hydroxy: 97 ng/mL (ref 30–100)

## 2016-02-15 LAB — INSULIN, RANDOM: INSULIN: 9.4 u[IU]/mL (ref 2.0–19.6)

## 2016-02-29 ENCOUNTER — Other Ambulatory Visit: Payer: Self-pay

## 2016-02-29 MED ORDER — ESTRADIOL 0.5 MG PO TABS: 0.5000 mg | ORAL_TABLET | Freq: Every day | ORAL | Status: DC

## 2016-03-28 ENCOUNTER — Ambulatory Visit (INDEPENDENT_AMBULATORY_CARE_PROVIDER_SITE_OTHER): Payer: Medicare Other | Admitting: Internal Medicine

## 2016-03-28 ENCOUNTER — Encounter: Payer: Self-pay | Admitting: Internal Medicine

## 2016-03-28 VITALS — BP 128/60 | HR 62 | Temp 98.0°F | Resp 16 | Ht 61.5 in | Wt 146.0 lb

## 2016-03-28 DIAGNOSIS — E559 Vitamin D deficiency, unspecified: Secondary | ICD-10-CM | POA: Diagnosis not present

## 2016-03-28 DIAGNOSIS — G518 Other disorders of facial nerve: Secondary | ICD-10-CM | POA: Diagnosis not present

## 2016-03-28 DIAGNOSIS — Z0001 Encounter for general adult medical examination with abnormal findings: Secondary | ICD-10-CM | POA: Diagnosis not present

## 2016-03-28 DIAGNOSIS — Z1159 Encounter for screening for other viral diseases: Secondary | ICD-10-CM | POA: Diagnosis not present

## 2016-03-28 DIAGNOSIS — G43909 Migraine, unspecified, not intractable, without status migrainosus: Secondary | ICD-10-CM

## 2016-03-28 DIAGNOSIS — R6889 Other general symptoms and signs: Secondary | ICD-10-CM | POA: Diagnosis not present

## 2016-03-28 DIAGNOSIS — I471 Supraventricular tachycardia: Secondary | ICD-10-CM | POA: Diagnosis not present

## 2016-03-28 DIAGNOSIS — K2100 Gastro-esophageal reflux disease with esophagitis, without bleeding: Secondary | ICD-10-CM

## 2016-03-28 DIAGNOSIS — E782 Mixed hyperlipidemia: Secondary | ICD-10-CM | POA: Diagnosis not present

## 2016-03-28 DIAGNOSIS — E2839 Other primary ovarian failure: Secondary | ICD-10-CM | POA: Diagnosis not present

## 2016-03-28 DIAGNOSIS — Z79899 Other long term (current) drug therapy: Secondary | ICD-10-CM | POA: Diagnosis not present

## 2016-03-28 DIAGNOSIS — I10 Essential (primary) hypertension: Secondary | ICD-10-CM

## 2016-03-28 DIAGNOSIS — R7303 Prediabetes: Secondary | ICD-10-CM | POA: Diagnosis not present

## 2016-03-28 DIAGNOSIS — Z0000 Encounter for general adult medical examination without abnormal findings: Secondary | ICD-10-CM

## 2016-03-28 DIAGNOSIS — R7309 Other abnormal glucose: Secondary | ICD-10-CM | POA: Diagnosis not present

## 2016-03-28 LAB — BASIC METABOLIC PANEL WITH GFR
BUN: 17 mg/dL (ref 7–25)
CO2: 27 mmol/L (ref 20–31)
Calcium: 9.9 mg/dL (ref 8.6–10.4)
Chloride: 106 mmol/L (ref 98–110)
Creat: 0.97 mg/dL (ref 0.50–0.99)
GFR, EST AFRICAN AMERICAN: 69 mL/min (ref >=60)
GFR, EST NON AFRICAN AMERICAN: 60 mL/min (ref >=60)
GLUCOSE: 86 mg/dL (ref 65–99)
POTASSIUM: 5 mmol/L (ref 3.5–5.3)
Sodium: 140 mmol/L (ref 135–146)

## 2016-03-28 LAB — CBC WITH DIFFERENTIAL/PLATELET
BASOS PCT: 1 %
Basophils Absolute: 68 cells/uL (ref 0–200)
Eosinophils Absolute: 136 cells/uL (ref 15–500)
Eosinophils Relative: 2 %
HEMATOCRIT: 43.1 % (ref 35.0–45.0)
HEMOGLOBIN: 14.2 g/dL (ref 11.7–15.5)
LYMPHS ABS: 1020 {cells}/uL (ref 850–3900)
Lymphocytes Relative: 15 %
MCH: 28.7 pg (ref 27.0–33.0)
MCHC: 32.9 g/dL (ref 32.0–36.0)
MCV: 87.2 fL (ref 80.0–100.0)
MONO ABS: 680 {cells}/uL (ref 200–950)
MPV: 9.2 fL (ref 7.5–12.5)
Monocytes Relative: 10 %
NEUTROS ABS: 4896 {cells}/uL (ref 1500–7800)
Neutrophils Relative %: 72 %
Platelets: 299 10*3/uL (ref 140–400)
RBC: 4.94 MIL/uL (ref 3.80–5.10)
RDW: 13.9 % (ref 11.0–15.0)
WBC: 6.8 10*3/uL (ref 3.8–10.8)

## 2016-03-28 LAB — HEPATIC FUNCTION PANEL
ALT: 15 U/L (ref 6–29)
AST: 23 U/L (ref 10–35)
Albumin: 4.2 g/dL (ref 3.6–5.1)
Alkaline Phosphatase: 65 U/L (ref 33–130)
BILIRUBIN TOTAL: 0.4 mg/dL (ref 0.2–1.2)
Bilirubin, Direct: 0.1 mg/dL (ref <=0.2)
Indirect Bilirubin: 0.3 mg/dL (ref 0.2–1.2)
Total Protein: 6.6 g/dL (ref 6.1–8.1)

## 2016-03-28 LAB — LIPID PANEL
CHOL/HDL RATIO: 2.6 ratio (ref <=5.0)
Cholesterol: 200 mg/dL (ref 125–200)
HDL: 76 mg/dL (ref >=46)
LDL Cholesterol: 105 mg/dL (ref <130)
TRIGLYCERIDES: 94 mg/dL (ref <150)
VLDL: 19 mg/dL (ref <30)

## 2016-03-28 LAB — TSH: TSH: 1.08 mIU/L

## 2016-03-28 NOTE — Progress Notes (Signed)
MEDICARE ANNUAL WELLNESS VISIT AND FOLLOW UP  Assessment:    1. Estrogen deficiency -will get scheduled - DG Bone Density; Future  2. Essential hypertension -Cont meds -dash diet -exercise as tolerated - TSH  3. Migraine without status migrainosus, not intractable, unspecified migraine type -cont aleve prn for migraine relief  4. SVT (supraventricular tachycardia) (HCC) -atenolol and asa as needed for palpitations  5. GERD -conservative measure -elevate HOB -medication prn  6. Hyperlipidemia -cont diet and exercise - Lipid panel  7. Prediabetes  - Hemoglobin A1c  8. Vitamin D deficiency -cont supplement  9. Facial neuralgia   10. Medication management  - CBC with Differential/Platelet - BASIC METABOLIC PANEL WITH GFR - Hepatic function panel  11. Morbid obesity, unspecified obesity type (HCC) -diet and exercise  12. Routine general medical examination at a health care facility -due next year  4513. Need for hepatitis C screening test  - Hepatitis C antibody     Over 30 minutes of exam, counseling, chart review, and critical decision making was performed  Plan:   During the course of the visit the patient was educated and counseled about appropriate screening and preventive services including:    Pneumococcal vaccine   Influenza vaccine  Td vaccine  Prevnar 13  Screening electrocardiogram  Screening mammography  Bone densitometry screening  Colorectal cancer screening  Diabetes screening  Glaucoma screening  Nutrition counseling   Advanced directives: given info/requested copies  Conditions/risks identified: Diabetes is at goal, ACE/ARB therapy: No, Reason not on Ace Inhibitor/ARB therapy:  hypotensin Urinary Incontinence is not an issue: discussed non pharmacology and pharmacology options.  Fall risk: low- discussed PT, home fall assessment, medications.    Subjective:   Jean Drake is a 70 y.o. female who presents for  Medicare Annual Wellness Visit and 3 month follow up on hypertension, prediabetes, hyperlipidemia, vitamin D def.  Date of last medicare wellness visit is unknown.   Her blood pressure has been controlled at home, today their BP is BP: 128/60 mmHg She does not workout. She denies chest pain, shortness of breath, dizziness.  She notes that she still has the occasional palpitation but she takes an extra atenolol and aspirin.    She is on cholesterol medication and denies myalgias. Her cholesterol is at goal. The cholesterol last visit was:   Lab Results  Component Value Date   CHOL 184 02/14/2016   HDL 67 02/14/2016   LDLCALC 97 02/14/2016   TRIG 102 02/14/2016   CHOLHDL 2.7 02/14/2016   She has been working on diet and exercise for prediabetes, and denies foot ulcerations, hyperglycemia, hypoglycemia , increased appetite, nausea, paresthesia of the feet, polydipsia, polyuria, visual disturbances, vomiting and weight loss. Last A1C in the office was:  Lab Results  Component Value Date   HGBA1C 5.6 02/14/2016   Last GFR Lab Results  Component Value Date   GFRNONAA 53* 02/14/2016   Lab Results  Component Value Date   GFRAA 61 02/14/2016   Patient is on Vitamin D supplement. Lab Results  Component Value Date   VD25OH 97 02/14/2016     Patient reports that things are going well.  Her husband is still having issues with his Dementia progressing which is difficult for her.  She does occasionally have to take her xanax as needed for sleeping, but otherwise is doing well.   Medication Review Current Outpatient Prescriptions on File Prior to Visit  Medication Sig Dispense Refill  . ALPRAZolam (XANAX) 0.5 MG tablet Take  0.25 mg by mouth as directed.    Marland Kitchen aspirin 81 MG tablet Take 81 mg by mouth daily.    Marland Kitchen atenolol (TENORMIN) 50 MG tablet Take 25 mg by mouth daily.    . Cholecalciferol (VITAMIN D) 2000 UNITS tablet Take 10,000 Units by mouth daily.     Marland Kitchen estradiol (ESTRACE) 0.5 MG  tablet Take 1 tablet (0.5 mg total) by mouth daily. 90 tablet 0  . fexofenadine (ALLEGRA) 180 MG tablet Take 180 mg by mouth daily.    Marland Kitchen MAGNESIUM OXIDE PO Take 250 mg by mouth daily.    . Multiple Vitamin (MULTIVITAMIN) tablet Take 1 tablet by mouth daily.    . multivitamin-lutein (OCUVITE-LUTEIN) CAPS capsule Take 1 capsule by mouth daily.     No current facility-administered medications on file prior to visit.    Current Problems (verified) Patient Active Problem List   Diagnosis Date Noted  . SVT (supraventricular tachycardia) (HCC) 11/15/2015  . Morbid obesity (HCC) 11/18/2014  . Medication management 04/20/2014  . Essential hypertension 10/20/2013  . Hyperlipidemia 10/20/2013  . Prediabetes 10/20/2013  . Vitamin D deficiency 10/20/2013  . GERD 10/20/2013  . Migraine headache 10/20/2013  . 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 DTaP 09/12/2013  . Influenza Split 09/12/2013  . Influenza, High Dose Seasonal PF 10/04/2014  . Pneumococcal Conjugate-13 10/04/2014  . Pneumococcal Polysaccharide-23 03/29/2009  . Td 12/20/2003  . Tdap 09/12/2013  . Zoster 06/07/2010    Preventative care: Last colonoscopy: 2008 Last mammogram: 2016  DEXA: 2011  Prior vaccinations: TD or Tdap: 2014  Influenza: 2015  Pneumococcal: 2010 Prevnar13: 2015 Shingles/Zostavax: 2011 Names of Other Physician/Practitioners you currently use: 1. [facility]- here for primary care 2. Dr. Dione Booze , eye doctor, last visit 2016 3. Dr. Lelon Perla , dentist, last visit 2017 Patient Care Team: Lucky Cowboy, MD as PCP - General (Internal Medicine) Louis Meckel, MD as Consulting Physician (Gastroenterology) Huel Cote, MD as Consulting Physician (Obstetrics and Gynecology)  Past Surgical History  Procedure Laterality Date  . No past surgeries     Family History  Problem Relation Age of  Onset  . Heart disease Mother    Social History  Substance Use Topics  . Smoking status: Never Smoker   . Smokeless tobacco: None  . Alcohol Use: No   Allergies  Allergen Reactions  . Penicillins Anaphylaxis  . Codeine Nausea And Vomiting  . Sudafed [Pseudoephedrine Hcl]     Makes heart race, per pt     MEDICARE WELLNESS OBJECTIVES: Tobacco use: She does not smoke.  Patient is not a former smoker. If yes, counseling given Alcohol Current alcohol use: none Diet: well balanced Physical activity: Current Exercise Habits: Home exercise routine, Type of exercise: walking, Time (Minutes): 30, Frequency (Times/Week): 6, Weekly Exercise (Minutes/Week): 180, Intensity: Mild Cardiac risk factors: Cardiac Risk Factors include: advanced age (>36men, >10 women);family history of premature cardiovascular disease;dyslipidemia;hypertension;sedentary lifestyle Depression/mood screen:   Depression screen [facility] 2/9 03/28/2016  Decreased Interest 0  Down, Depressed, Hopeless 0  PHQ - 2 Score 0    ADLs:  In your present state of health, do you have any difficulty performing the following activities: 03/28/2016 02/14/2016  Hearing? N N  Vision? N N  Difficulty concentrating or making decisions? N N  Walking or climbing stairs? N N  Dressing or bathing? N N  Doing errands, shopping? N N  Preparing Food and eating ?  N -  Using the Toilet? N -  In the past six months, have you accidently leaked urine? N -  Do you have problems with loss of bowel control? N -  Managing your Medications? N -  Managing your Finances? N -  Housekeeping or managing your Housekeeping? N -     Cognitive Testing  Alert? Yes  Normal Appearance?Yes  Oriented to person? Yes  Place? Yes   Time? Yes  Recall of three objects?  Yes  Can perform simple calculations? Yes  Displays appropriate judgment?Yes  Can read the correct time from a watch face?Yes  EOL planning: Does patient have an advance directive?: Yes Type of  Advance Directive: Healthcare Power of Attorney, Living will Does patient want to make changes to advanced directive?: No - Patient declined Copy of advanced directive(s) in chart?: Yes   Objective:   Today's Vitals   03/28/16 1046  BP: 128/60  Pulse: 62  Temp: 98 F (36.7 C)  TempSrc: Temporal  Resp: 16  Height: 5' 1.5" (1.562 m)  Weight: 146 lb (66.225 kg)   Body mass index is 27.14 kg/(m^2).  General appearance: alert, no distress, WD/WN,  female HEENT: normocephalic, sclerae anicteric, TMs pearly, nares patent, no discharge or erythema, pharynx normal Oral cavity: MMM, no lesions Neck: supple, no lymphadenopathy, no thyromegaly, no masses Heart: RRR, normal S1, S2, no murmurs Lungs: CTA bilaterally, no wheezes, rhonchi, or rales Abdomen: +bs, soft, non tender, non distended, no masses, no hepatomegaly, no splenomegaly Musculoskeletal: nontender, no swelling, no obvious deformity Extremities: no edema, no cyanosis, no clubbing Pulses: 2+ symmetric, upper and lower extremities, normal cap refill Neurological: alert, oriented x 3, CN2-12 intact, strength normal upper extremities and lower extremities, sensation normal throughout, DTRs 2+ throughout, no cerebellar signs, gait normal Psychiatric: normal affect, behavior normal, pleasant  Breast: defer Gyn: defer Rectal: defer   Medicare Attestation I have personally reviewed: The patient's medical and social history Their use of alcohol, tobacco or illicit drugs Their current medications and supplements The patient's functional ability including ADLs,fall risks, home safety risks, cognitive, and hearing and visual impairment Diet and physical activities Evidence for depression or mood disorders  The patient's weight, height, BMI, and visual acuity have been recorded in the chart.  I have made referrals, counseling, and provided education to the patient based on review of the above and I have provided the patient with a  written personalized care plan for preventive services.     Terri Piedra, PA-C   03/28/2016

## 2016-03-29 LAB — HEPATITIS C ANTIBODY: HCV Ab: NEGATIVE

## 2016-03-29 LAB — HEMOGLOBIN A1C
Hgb A1c MFr Bld: 5.6 % (ref <5.7)
Mean Plasma Glucose: 114 mg/dL

## 2016-04-16 ENCOUNTER — Ambulatory Visit: Payer: Self-pay | Admitting: Internal Medicine

## 2016-04-24 DIAGNOSIS — H2512 Age-related nuclear cataract, left eye: Secondary | ICD-10-CM | POA: Diagnosis not present

## 2016-04-24 DIAGNOSIS — H04123 Dry eye syndrome of bilateral lacrimal glands: Secondary | ICD-10-CM | POA: Diagnosis not present

## 2016-04-24 DIAGNOSIS — H25811 Combined forms of age-related cataract, right eye: Secondary | ICD-10-CM | POA: Diagnosis not present

## 2016-05-02 ENCOUNTER — Other Ambulatory Visit: Payer: Self-pay | Admitting: Internal Medicine

## 2016-05-02 DIAGNOSIS — Z1231 Encounter for screening mammogram for malignant neoplasm of breast: Secondary | ICD-10-CM

## 2016-06-04 ENCOUNTER — Other Ambulatory Visit: Payer: Self-pay | Admitting: Physician Assistant

## 2016-06-04 MED ORDER — ALPRAZOLAM 0.5 MG PO TABS: 0.2500 mg | ORAL_TABLET | Freq: Two times a day (BID) | ORAL | 3 refills | Status: DC | PRN

## 2016-07-30 ENCOUNTER — Ambulatory Visit (INDEPENDENT_AMBULATORY_CARE_PROVIDER_SITE_OTHER): Payer: Medicare Other | Admitting: Internal Medicine

## 2016-07-30 ENCOUNTER — Encounter: Payer: Self-pay | Admitting: Internal Medicine

## 2016-07-30 VITALS — BP 160/82 | HR 64 | Temp 97.7°F | Resp 16 | Ht 61.0 in | Wt 150.6 lb

## 2016-07-30 DIAGNOSIS — Z79899 Other long term (current) drug therapy: Secondary | ICD-10-CM | POA: Diagnosis not present

## 2016-07-30 DIAGNOSIS — Z0001 Encounter for general adult medical examination with abnormal findings: Secondary | ICD-10-CM

## 2016-07-30 DIAGNOSIS — Z136 Encounter for screening for cardiovascular disorders: Secondary | ICD-10-CM | POA: Diagnosis not present

## 2016-07-30 DIAGNOSIS — Z1211 Encounter for screening for malignant neoplasm of colon: Secondary | ICD-10-CM

## 2016-07-30 DIAGNOSIS — R6889 Other general symptoms and signs: Secondary | ICD-10-CM

## 2016-07-30 DIAGNOSIS — E559 Vitamin D deficiency, unspecified: Secondary | ICD-10-CM

## 2016-07-30 DIAGNOSIS — K2100 Gastro-esophageal reflux disease with esophagitis, without bleeding: Secondary | ICD-10-CM

## 2016-07-30 DIAGNOSIS — I10 Essential (primary) hypertension: Secondary | ICD-10-CM

## 2016-07-30 DIAGNOSIS — E782 Mixed hyperlipidemia: Secondary | ICD-10-CM | POA: Diagnosis not present

## 2016-07-30 DIAGNOSIS — R7303 Prediabetes: Secondary | ICD-10-CM | POA: Diagnosis not present

## 2016-07-30 DIAGNOSIS — H00019 Hordeolum externum unspecified eye, unspecified eyelid: Secondary | ICD-10-CM

## 2016-07-30 LAB — LIPID PANEL
CHOL/HDL RATIO: 2.4 ratio (ref <=5.0)
Cholesterol: 195 mg/dL (ref 125–200)
HDL: 81 mg/dL (ref >=46)
LDL Cholesterol: 94 mg/dL (ref <130)
TRIGLYCERIDES: 98 mg/dL (ref <150)
VLDL: 20 mg/dL (ref <30)

## 2016-07-30 LAB — BASIC METABOLIC PANEL WITH GFR
BUN: 13 mg/dL (ref 7–25)
CHLORIDE: 105 mmol/L (ref 98–110)
CO2: 23 mmol/L (ref 20–31)
CREATININE: 0.83 mg/dL (ref 0.60–0.93)
Calcium: 9.8 mg/dL (ref 8.6–10.4)
GFR, EST AFRICAN AMERICAN: 83 mL/min (ref >=60)
GFR, EST NON AFRICAN AMERICAN: 72 mL/min (ref >=60)
Glucose, Bld: 83 mg/dL (ref 65–99)
POTASSIUM: 4.3 mmol/L (ref 3.5–5.3)
SODIUM: 140 mmol/L (ref 135–146)

## 2016-07-30 LAB — CBC WITH DIFFERENTIAL/PLATELET
BASOS PCT: 1 %
Basophils Absolute: 56 cells/uL (ref 0–200)
EOS ABS: 168 {cells}/uL (ref 15–500)
EOS PCT: 3 %
HCT: 43 % (ref 35.0–45.0)
Hemoglobin: 14.4 g/dL (ref 11.7–15.5)
LYMPHS PCT: 18 %
Lymphs Abs: 1008 cells/uL (ref 850–3900)
MCH: 29.5 pg (ref 27.0–33.0)
MCHC: 33.5 g/dL (ref 32.0–36.0)
MCV: 88.1 fL (ref 80.0–100.0)
MONOS PCT: 11 %
MPV: 9 fL (ref 7.5–12.5)
Monocytes Absolute: 616 cells/uL (ref 200–950)
NEUTROS ABS: 3752 {cells}/uL (ref 1500–7800)
Neutrophils Relative %: 67 %
PLATELETS: 275 10*3/uL (ref 140–400)
RBC: 4.88 MIL/uL (ref 3.80–5.10)
RDW: 13.4 % (ref 11.0–15.0)
WBC: 5.6 10*3/uL (ref 3.8–10.8)

## 2016-07-30 LAB — TSH: TSH: 1.63 m[IU]/L

## 2016-07-30 LAB — HEPATIC FUNCTION PANEL
ALBUMIN: 4.3 g/dL (ref 3.6–5.1)
ALK PHOS: 75 U/L (ref 33–130)
ALT: 17 U/L (ref 6–29)
AST: 24 U/L (ref 10–35)
Bilirubin, Direct: 0.1 mg/dL (ref <=0.2)
Indirect Bilirubin: 0.2 mg/dL (ref 0.2–1.2)
TOTAL PROTEIN: 6.7 g/dL (ref 6.1–8.1)
Total Bilirubin: 0.3 mg/dL (ref 0.2–1.2)

## 2016-07-30 LAB — MAGNESIUM: MAGNESIUM: 1.9 mg/dL (ref 1.5–2.5)

## 2016-07-30 MED ORDER — NEOMYCIN-POLYMYXIN-DEXAMETH 3.5-10000-0.1 OP SUSP: OPHTHALMIC | 1 refills | Status: AC

## 2016-07-30 MED ORDER — BACITRACIN-POLYMYX-NEO-HC 1 % OP OINT: TOPICAL_OINTMENT | OPHTHALMIC | 1 refills | Status: DC

## 2016-07-30 NOTE — Progress Notes (Signed)
[facility]                   Jean Drake, M.D.    Dyanne CarrelAmanda R. Steffanie Dunnollier, P.A.-C      Terri Piedraourtney Forcucci, P.A.-C  [facility]                [address]                [place], [place] [id number] Telephone [phone] Telefax [phone]  Annual Screening/Preventative Visit & Comprehensive Evaluation &  Examination     This very nice 70 y.o. MWF presents for a Screening/Preventative Visit & comprehensive evaluation and management of multiple medical co-morbidities.  Patient has been followed for HTN, T2_NIDDM  Prediabetes, Hyperlipidemia and Vitamin D Deficiency. She also c/o several day hx/o redness, discomfort of both lower eye lids with crusting of the eyelids in the morning. Patient also has GERD wityh sx/'s controlled with diet & occasional Ranitidine.     HTN predates since 2013 when she was initiated on Atenolol for labile HTN & symptomatic palpitations. Patient's BP has been controlled at home, altho today's BP is elevated at  160/82.  Patient denies any cardiac symptoms as chest pain, palpitations, shortness of breath, dizziness or ankle swelling.      Patient's hyperlipidemia is controlled with diet and medications. Patient denies myalgias or other medication SE's. Last lipids were near goal: Lab Results  Component Value Date   CHOL 200 03/28/2016   HDL 76 03/28/2016   LDLCALC 105 03/28/2016   TRIG 94 03/28/2016   CHOLHDL 2.6 03/28/2016      Patient has prediabetes predating since      and patient denies reactive hypoglycemic symptoms, visual blurring, diabetic polys, or paresthesias. Last A1c was at goal: Lab Results  Component Value Date   HGBA1C 5.6 03/28/2016      Finally, patient has history of Vitamin D Deficiency and last Vitamin D was at goal: Lab Results  Component Value Date   VD25OH 97 02/14/2016   Current Outpatient Prescriptions on File Prior to Visit  Medication Sig  . ALPRAZolam (XANAX)  0.5 MG tablet Take 0.5 tablets (0.25 mg total) by mouth 2 (two) times daily as needed for anxiety.  Marland Kitchen. aspirin 81 MG tablet Take 81 mg by mouth daily.  Marland Kitchen. atenolol (TENORMIN) 50 MG tablet Take 25 mg by mouth daily.  . Cholecalciferol (VITAMIN D) 2000 UNITS tablet Take 10,000 Units by mouth daily.   Marland Kitchen. estradiol (ESTRACE) 0.5 MG tablet Take 1 tablet (0.5 mg total) by mouth daily.  . fexofenadine (ALLEGRA) 180 MG tablet Take 180 mg by mouth daily.  Marland Kitchen. MAGNESIUM OXIDE PO Take 250 mg by mouth daily.  . Multiple Vitamin (MULTIVITAMIN) tablet Take 1 tablet by mouth daily.  . multivitamin-lutein (OCUVITE-LUTEIN) CAPS capsule Take 1 capsule by mouth daily.  . ranitidine (ZANTAC) 75 MG tablet Take 75 mg by mouth 2 (two) times daily.   No current facility-administered medications on file prior to visit.    Allergies  Allergen Reactions  . Penicillins Anaphylaxis  . Codeine Nausea And Vomiting  . Sudafed [Pseudoephedrine Hcl]     Makes heart race, per pt    Past Medical History:  Diagnosis Date  . Essential hypertension 10/20/2013  . 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 GERD 10/20/2013  . Hyperlipidemia 10/20/2013  . Migraine, unspecified, without mention of intractable migraine without mention of status migrainosus 10/20/2013  . Morbid obesity (HCC) 11/18/2014  .  Prediabetes 10/20/2013  . SVT (supraventricular tachycardia) (HCC) 11/15/2015  . Vitamin D deficiency 10/20/2013   Health Maintenance  Topic Date Due  . PNA vac Low Risk Adult (2 of 2 - PPSV23) 10/05/2015  . INFLUENZA VACCINE  06/12/2016  . COLONOSCOPY  06/12/2017  . MAMMOGRAM  08/09/2017  . TETANUS/TDAP  09/13/2023  . DEXA SCAN  Completed  . ZOSTAVAX  Completed  . Hepatitis C Screening  Completed   Immunization History  Administered Date(s) Administered  . DTaP 09/12/2013  . Influenza Split 09/12/2013  . Influenza, High Dose Seasonal PF 10/04/2014  . Pneumococcal Conjugate-13 10/04/2014  . Pneumococcal Polysaccharide-23 03/29/2009  . Td  12/20/2003  . Tdap 09/12/2013  . Zoster 06/07/2010   Past Surgical History:  Procedure Laterality Date  . NO PAST SURGERIES     Family History  Problem Relation Age of Onset  . Heart disease Mother    Social History  Substance Use Topics  . Smoking status: Never Smoker  . Smokeless tobacco: Not on file  . Alcohol use No    ROS Constitutional: Denies fever, chills, weight loss/gain, headaches, insomnia,  night sweats, and change in appetite. Does c/o fatigue. Eyes: Denies redness, blurred vision, diplopia, discharge, itchy, watery eyes.  ENT: Denies discharge, congestion, post nasal drip, epistaxis, sore throat, earache, hearing loss, dental pain, Tinnitus, Vertigo, Sinus pain, snoring.  Cardio: Denies chest pain, palpitations, irregular heartbeat, syncope, dyspnea, diaphoresis, orthopnea, PND, claudication, edema Respiratory: denies cough, dyspnea, DOE, pleurisy, hoarseness, laryngitis, wheezing.  Gastrointestinal: Denies dysphagia, heartburn, reflux, water brash, pain, cramps, nausea, vomiting, bloating, diarrhea, constipation, hematemesis, melena, hematochezia, jaundice, hemorrhoids Genitourinary: Denies dysuria, frequency, urgency, nocturia, hesitancy, discharge, hematuria, flank pain Breast: Breast lumps, nipple discharge, bleeding.  Musculoskeletal: Denies arthralgia, myalgia, stiffness, Jt. Swelling, pain, limp, and strain/sprain. Denies falls. Skin: Denies puritis, rash, hives, warts, acne, eczema, changing in skin lesion Neuro: No weakness, tremor, incoordination, spasms, paresthesia, pain Psychiatric: Denies confusion, memory loss, sensory loss. Denies Depression. Endocrine: Denies change in weight, skin, hair change, nocturia, and paresthesia, diabetic polys, visual blurring, hyper / hypo glycemic episodes.  Heme/Lymph: No excessive bleeding, bruising, enlarged lymph nodes.  Physical Exam  BP (!) 160/82   Pulse 64   Temp 97.7 F (36.5 C)   Resp 16   Ht 5\' 1"  (1.549  m)   Wt 150 lb 9.6 oz (68.3 kg)   BMI 28.46 kg/m   General Appearance: Well nourished and in no apparent distress.  Eyes: PERRLA, EOMs, conjunctiva 2+ injected R>L with noted styes of both lower eye lids, R>L, normal fundi and vessels. Sinuses: No frontal/maxillary tenderness ENT/Mouth: EACs patent / TMs  nl. Nares clear without erythema, swelling, mucoid exudates. Oral hygiene is good. No erythema, swelling, or exudate. Tongue normal, non-obstructing. Tonsils not swollen or erythematous. Hearing normal.  Neck: Supple, thyroid normal. No bruits, nodes or JVD. Respiratory: Respiratory effort normal.  BS equal and clear bilateral without rales, rhonci, wheezing or stridor. Cardio: Heart sounds are normal with regular rate and rhythm and no murmurs, rubs or gallops. Peripheral pulses are normal and equal bilaterally without edema. No aortic or femoral bruits. Chest: symmetric with normal excursions and percussion. Breasts: Symmetric, without lumps, nipple discharge, retractions, or fibrocystic changes.  Abdomen: Flat, soft with bowel sounds active. Nontender, no guarding, rebound, hernias, masses, or organomegaly.  Lymphatics: Non tender without lymphadenopathy.  Musculoskeletal: Full ROM all peripheral extremities, joint stability, 5/5 strength, and normal gait. Skin: Warm and dry without rashes, lesions, cyanosis, clubbing or  ecchymosis.  Neuro: Cranial nerves intact, reflexes equal bilaterally. Normal muscle tone, no cerebellar symptoms. Sensation intact.  Pysch: Alert and oriented X 3, normal affect, Insight and Judgment appropriate.   Assessment and Plan  1. Annual Preventative Screening Examination  - EKG 12-Lead - POC Hemoccult Bld/Stl  - Urinalysis, Routine w reflex microscopic  - CBC with Differential/Platelet - BASIC METABOLIC PANEL WITH GFR - Hepatic function panel - Magnesium - Lipid panel - TSH - Hemoglobin A1c - Insulin, random - VITAMIN D 25 Hydroxy   2. Essential  hypertension  - EKG 12-Lead - TSH  3. Hyperlipidemia  - Lipid panel - TSH  4. Prediabetes  - Hemoglobin A1c - Insulin, random  5. Vitamin D deficiency  - VITAMIN D 25 Hydroxy   6. GERD   7. Colon cancer screening  - POC Hemoccult Bld/Stl   8. Medication management  - Urinalysis, Routine w reflex microscopic  - CBC with Differential/Platelet - BASIC METABOLIC PANEL WITH GFR - Hepatic function panel - Magnesium  9. Screening for ischemic heart disease   10. Hordeolum externum (stye), OU  - neomycin-polymyxin b-dexamethasone (MAXITROL) 3.5-10000-0.1 SUSP; 1-2 drops every 1 to 2 hours for 1st day , then 4 x/da  Dispense: 5 mL; Refill: 1  - Rx- Poly-mix HC Opth Ung qhs called to Pharmacy.    Continue prudent diet as discussed, weight control, BP monitoring, regular exercise, and medications. Discussed med's effects and SE's. Screening labs and tests as requested with regular follow-up as recommended. Over 40 minutes of exam, counseling, chart review and high complex critical decision making was performed.

## 2016-07-30 NOTE — Patient Instructions (Signed)

## 2016-07-31 LAB — URINALYSIS, ROUTINE W REFLEX MICROSCOPIC
BILIRUBIN URINE: NEGATIVE
Glucose, UA: NEGATIVE
Hgb urine dipstick: NEGATIVE
Ketones, ur: NEGATIVE
LEUKOCYTES UA: NEGATIVE
Nitrite: NEGATIVE
PROTEIN: NEGATIVE
SPECIFIC GRAVITY, URINE: 1.007 (ref 1.001–1.035)
pH: 7 (ref 5.0–8.0)

## 2016-07-31 LAB — HEMOGLOBIN A1C
Hgb A1c MFr Bld: 5.3 % (ref <5.7)
Mean Plasma Glucose: 105 mg/dL

## 2016-07-31 LAB — INSULIN, RANDOM: INSULIN: 8.9 u[IU]/mL (ref 2.0–19.6)

## 2016-07-31 LAB — VITAMIN D 25 HYDROXY (VIT D DEFICIENCY, FRACTURES): VIT D 25 HYDROXY: 88 ng/mL (ref 30–100)

## 2016-08-09 DIAGNOSIS — H00025 Hordeolum internum left lower eyelid: Secondary | ICD-10-CM | POA: Diagnosis not present

## 2016-08-09 DIAGNOSIS — H10413 Chronic giant papillary conjunctivitis, bilateral: Secondary | ICD-10-CM | POA: Diagnosis not present

## 2016-08-09 DIAGNOSIS — H00022 Hordeolum internum right lower eyelid: Secondary | ICD-10-CM | POA: Diagnosis not present

## 2016-08-09 DIAGNOSIS — D2312 Other benign neoplasm of skin of left eyelid, including canthus: Secondary | ICD-10-CM | POA: Diagnosis not present

## 2016-08-10 ENCOUNTER — Ambulatory Visit
Admission: RE | Admit: 2016-08-10 | Discharge: 2016-08-10 | Disposition: A | Discharge status: Home / Self Care | Payer: Medicare Other | Source: Ambulatory Visit | Attending: Internal Medicine | Admitting: Internal Medicine

## 2016-08-10 DIAGNOSIS — Z1231 Encounter for screening mammogram for malignant neoplasm of breast: Secondary | ICD-10-CM

## 2016-08-10 DIAGNOSIS — Z780 Asymptomatic menopausal state: Secondary | ICD-10-CM | POA: Diagnosis not present

## 2016-08-10 DIAGNOSIS — M85851 Other specified disorders of bone density and structure, right thigh: Secondary | ICD-10-CM | POA: Diagnosis not present

## 2016-08-10 DIAGNOSIS — E2839 Other primary ovarian failure: Secondary | ICD-10-CM

## 2016-08-23 ENCOUNTER — Other Ambulatory Visit: Payer: Self-pay | Admitting: *Deleted

## 2016-08-23 DIAGNOSIS — Z0001 Encounter for general adult medical examination with abnormal findings: Secondary | ICD-10-CM

## 2016-08-23 DIAGNOSIS — Z1211 Encounter for screening for malignant neoplasm of colon: Secondary | ICD-10-CM

## 2016-08-23 LAB — POC HEMOCCULT BLD/STL (HOME/3-CARD/SCREEN)
Card #2 Fecal Occult Blod, POC: NEGATIVE
Card #3 Fecal Occult Blood, POC: NEGATIVE
FECAL OCCULT BLD: NEGATIVE

## 2016-08-24 ENCOUNTER — Other Ambulatory Visit: Payer: Self-pay | Admitting: Internal Medicine

## 2016-09-06 DIAGNOSIS — H01025 Squamous blepharitis left lower eyelid: Secondary | ICD-10-CM | POA: Diagnosis not present

## 2016-09-06 DIAGNOSIS — H01024 Squamous blepharitis left upper eyelid: Secondary | ICD-10-CM | POA: Diagnosis not present

## 2016-09-06 DIAGNOSIS — H01021 Squamous blepharitis right upper eyelid: Secondary | ICD-10-CM | POA: Diagnosis not present

## 2016-09-06 DIAGNOSIS — H10413 Chronic giant papillary conjunctivitis, bilateral: Secondary | ICD-10-CM | POA: Diagnosis not present

## 2016-09-06 DIAGNOSIS — H01022 Squamous blepharitis right lower eyelid: Secondary | ICD-10-CM | POA: Diagnosis not present

## 2016-10-29 ENCOUNTER — Encounter: Payer: Self-pay | Admitting: Internal Medicine

## 2016-10-29 ENCOUNTER — Ambulatory Visit (INDEPENDENT_AMBULATORY_CARE_PROVIDER_SITE_OTHER): Payer: Medicare Other | Admitting: Internal Medicine

## 2016-10-29 VITALS — BP 126/62 | HR 58 | Temp 98.2°F | Resp 16 | Ht 61.0 in | Wt 152.0 lb

## 2016-10-29 DIAGNOSIS — Z6828 Body mass index (BMI) 28.0-28.9, adult: Secondary | ICD-10-CM | POA: Diagnosis not present

## 2016-10-29 DIAGNOSIS — I10 Essential (primary) hypertension: Secondary | ICD-10-CM | POA: Diagnosis not present

## 2016-10-29 DIAGNOSIS — E782 Mixed hyperlipidemia: Secondary | ICD-10-CM | POA: Diagnosis not present

## 2016-10-29 DIAGNOSIS — I471 Supraventricular tachycardia: Secondary | ICD-10-CM | POA: Diagnosis not present

## 2016-10-29 DIAGNOSIS — Z79899 Other long term (current) drug therapy: Secondary | ICD-10-CM

## 2016-10-29 DIAGNOSIS — S8012XA Contusion of left lower leg, initial encounter: Secondary | ICD-10-CM

## 2016-10-29 DIAGNOSIS — E559 Vitamin D deficiency, unspecified: Secondary | ICD-10-CM | POA: Diagnosis not present

## 2016-10-29 DIAGNOSIS — K5909 Other constipation: Secondary | ICD-10-CM | POA: Diagnosis not present

## 2016-10-29 DIAGNOSIS — R7303 Prediabetes: Secondary | ICD-10-CM

## 2016-10-29 NOTE — Patient Instructions (Signed)
Stop allegra.   Please start using 2 sprays of flonase per nostril.    Please continue to use a heating pad on your hip.     Hematoma A hematoma is a collection of blood. The collection of blood can turn into a hard, painful lump under the skin. Your skin may turn blue or yellow if the hematoma is close to the surface of the skin. Most hematomas get better in a few days to weeks. Some hematomas are serious and need medical care. Hematomas can be very small or very big. Follow these instructions at home:  Apply ice to the injured area:  Put ice in a plastic bag.  Place a towel between your skin and the bag.  Leave the ice on for 20 minutes, 2-3 times a day for the first 1 to 2 days.  After the first 2 days, switch to using warm packs on the injured area.  Raise (elevate) the injured area to lessen pain and puffiness (swelling). You may also wrap the area with an elastic bandage. Make sure the bandage is not wrapped too tight.  If you have a painful hematoma on your leg or foot, you may use crutches for a couple days.  Only take medicines as told by your doctor. Get help right away if:  Your pain gets worse.  Your pain is not controlled with medicine.  You have a fever.  Your puffiness gets worse.  Your skin turns more blue or yellow.  Your skin over the hematoma breaks or starts bleeding.  Your hematoma is in your chest or belly (abdomen) and you are short of breath, feel weak, or have a change in consciousness.  Your hematoma is on your scalp and you have a headache that gets worse or a change in alertness or consciousness. This information is not intended to replace advice given to you by your health care provider. Make sure you discuss any questions you have with your health care provider. Document Released: 12/06/2004 Document Revised: 04/05/2016 Document Reviewed: 04/08/2013 Elsevier Interactive Patient Education  2017 [employer]

## 2016-10-29 NOTE — Progress Notes (Signed)
Assessment and Plan:  Hypertension:  -Continue medication,  -monitor blood pressure at home.  -Continue DASH diet.   -Reminder to go to the ER if any CP, SOB, nausea, dizziness, severe HA, changes vision/speech, left arm numbness and tingling, and jaw pain.  Cholesterol: -Continue diet and exercise.   Pre-diabetes: -Continue diet and exercise.   Vitamin D Def: -continue medications.   Chronic constipation -miralax every other day -cont benefiber or metamucil -increase water intake -avoid cheeses and dairy product  Left lateral thigh hematoma -healing well -recommended using heating pads  SVT -no runs recently -cont meds   Continue diet and meds as discussed. Further disposition pending results of labs.  HPI 70 y.o. female  presents for 3 month follow up with hypertension, hyperlipidemia, prediabetes and vitamin D.   Her blood pressure has been controlled at home, today their BP is BP: 126/62.   She does workout. She denies chest pain, shortness of breath, dizziness.  She reports that she is getting some exercise by getting house work done.  She has had no runs of SVT lately.      She is on cholesterol medication and denies myalgias. Her cholesterol is at goal. The cholesterol last visit was:   Lab Results  Component Value Date   CHOL 195 07/30/2016   HDL 81 07/30/2016   LDLCALC 94 07/30/2016   TRIG 98 07/30/2016   CHOLHDL 2.4 07/30/2016     She has been working on diet and exercise for prediabetes, and denies foot ulcerations, hyperglycemia, hypoglycemia , increased appetite, nausea, paresthesia of the feet, polydipsia, polyuria, visual disturbances, vomiting and weight loss. Last A1C in the office was:  Lab Results  Component Value Date   HGBA1C 5.3 07/30/2016    Patient is on Vitamin D supplement.  Lab Results  Component Value Date   VD25OH 2788 07/30/2016      Patient reports that after the snow and ice she fell about a week and a half ago and she fell  and landed on her left hip.  She reports that she has had a lot of bruising.  She reports that it is improving.  She reports that she thinks that it is improving.  She reports that it felt like it was a little bit warm.    She reports that she has struggled with constipation lately.  She eats fruit and also takes stool softener, metamucil and miralax.  She reports that she is frustrated.  She reports that she is generally backed up.     Current Medications:  Current Outpatient Prescriptions on File Prior to Visit  Medication Sig Dispense Refill  . ALPRAZolam (XANAX) 0.5 MG tablet Take 0.5 tablets (0.25 mg total) by mouth 2 (two) times daily as needed for anxiety. 60 tablet 3  . aspirin 81 MG tablet Take 81 mg by mouth daily.    Marland Kitchen. atenolol (TENORMIN) 50 MG tablet Take 25 mg by mouth daily.    . Cholecalciferol (VITAMIN D) 2000 UNITS tablet Take 10,000 Units by mouth daily.     Marland Kitchen. estradiol (ESTRACE) 0.5 MG tablet TAKE ONE TABLET BY MOUTH EVERY DAY 90 tablet 1  . fexofenadine (ALLEGRA) 180 MG tablet Take 180 mg by mouth daily.    . Flaxseed, Linseed, (FLAXSEED OIL) 1000 MG CAPS Take 1 capsule by mouth daily.    Marland Kitchen. MAGNESIUM OXIDE PO Take 250 mg by mouth daily.    . Multiple Vitamin (MULTIVITAMIN) tablet Take 1 tablet by mouth daily.    .Marland Kitchen  multivitamin-lutein (OCUVITE-LUTEIN) CAPS capsule Take 1 capsule by mouth daily.    . Omega-3 Fatty Acids (FISH OIL) 1000 MG CAPS Take 1 capsule by mouth daily.    . ranitidine (ZANTAC) 75 MG tablet Take 75 mg by mouth 2 (two) times daily.     No current facility-administered medications on file prior to visit.     Medical History:  Past Medical History:  Diagnosis Date  . Essential hypertension 10/20/2013  . 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 GERD 10/20/2013  . Hyperlipidemia 10/20/2013  . Migraine, unspecified, without mention of intractable migraine without mention of status migrainosus 10/20/2013  . Morbid obesity (HCC) 11/18/2014  . Prediabetes 10/20/2013  .  SVT (supraventricular tachycardia) (HCC) 11/15/2015  . Vitamin D deficiency 10/20/2013    Allergies:  Allergies  Allergen Reactions  . Penicillins Anaphylaxis  . Codeine Nausea And Vomiting  . Sudafed [Pseudoephedrine Hcl]     Makes heart race, per pt      Review of Systems:  Review of Systems  Constitutional: Negative for chills, fever and malaise/fatigue.  HENT: Negative for congestion, ear pain and sore throat.   Eyes: Negative.   Respiratory: Negative for cough, shortness of breath and wheezing.   Cardiovascular: Negative for chest pain, palpitations and leg swelling.  Gastrointestinal: Negative for abdominal pain, blood in stool, constipation, diarrhea, heartburn and melena.  Genitourinary: Negative.   Skin: Negative.   Neurological: Negative for dizziness, sensory change, loss of consciousness and headaches.  Psychiatric/Behavioral: Negative for depression. The patient is not nervous/anxious and does not have insomnia.     Family history- Review and unchanged  Social history- Review and unchanged  Physical Exam: BP 126/62   Pulse (!) 58   Temp 98.2 F (36.8 C) (Temporal)   Resp 16   Ht 5\' 1"  (1.549 m)   Wt 152 lb (68.9 kg)   BMI 28.72 kg/m  Wt Readings from Last 3 Encounters:  10/29/16 152 lb (68.9 kg)  07/30/16 150 lb 9.6 oz (68.3 kg)  03/28/16 146 lb (66.2 kg)    General Appearance: Well nourished well developed, in no apparent distress. Eyes: PERRLA, EOMs, conjunctiva no swelling or erythema ENT/Mouth: Ear canals normal without obstruction, swelling, erythma, discharge.  TMs normal bilaterally.  Oropharynx moist, clear, without exudate, or postoropharyngeal swelling. Neck: Supple, thyroid normal,no cervical adenopathy  Respiratory: Respiratory effort normal, Breath sounds clear A&P without rhonchi, wheeze, or rale.  No retractions, no accessory usage. Cardio: RRR with no MRGs. Brisk peripheral pulses without edema.  Abdomen: Soft, + BS,  Non tender, no  guarding, rebound, hernias, masses. Musculoskeletal: Full ROM, 5/5 strength, Normal gait Skin: Warm, dry without rashes, lesions, ecchymosis.  Neuro: Awake and oriented X 3, Cranial nerves intact. Normal muscle tone, no cerebellar symptoms. Psych: Normal affect, Insight and Judgment appropriate.    Terri Piedraourtney Forcucci, PA-C 10:39 AM [facility]

## 2016-11-06 ENCOUNTER — Other Ambulatory Visit: Payer: Self-pay | Admitting: Internal Medicine

## 2016-11-07 ENCOUNTER — Other Ambulatory Visit: Payer: Self-pay | Admitting: *Deleted

## 2016-11-07 MED ORDER — ATENOLOL 50 MG PO TABS: 50.0000 mg | ORAL_TABLET | Freq: Every day | ORAL | 1 refills | Status: DC

## 2017-01-29 ENCOUNTER — Ambulatory Visit (INDEPENDENT_AMBULATORY_CARE_PROVIDER_SITE_OTHER): Payer: Medicare Other | Admitting: Internal Medicine

## 2017-01-29 VITALS — BP 136/68 | HR 64 | Temp 97.5°F | Resp 16 | Ht 61.0 in | Wt 152.0 lb

## 2017-01-29 DIAGNOSIS — I10 Essential (primary) hypertension: Secondary | ICD-10-CM | POA: Diagnosis not present

## 2017-01-29 DIAGNOSIS — Z79899 Other long term (current) drug therapy: Secondary | ICD-10-CM

## 2017-01-29 DIAGNOSIS — E559 Vitamin D deficiency, unspecified: Secondary | ICD-10-CM | POA: Diagnosis not present

## 2017-01-29 DIAGNOSIS — E782 Mixed hyperlipidemia: Secondary | ICD-10-CM | POA: Diagnosis not present

## 2017-01-29 DIAGNOSIS — R7303 Prediabetes: Secondary | ICD-10-CM

## 2017-01-29 NOTE — Patient Instructions (Signed)

## 2017-01-29 NOTE — Progress Notes (Signed)
6 month follow up with Hypertension, Hyperlipidemia, Pre-Diabetes and Vitamin D Deficiency.      Patient is treated for HTN (2013) & BP has been controlled at home. Today's BP is at goal - 136/68. Patient has had no complaints of any cardiac type chest pain, palpitations, dyspnea/orthopnea/PND, dizziness, claudication, or dependent edema.     Hyperlipidemia is controlled with diet & meds. Patient denies myalgias or other med SE's. Last Lipids were at goal: Lab Results  Component Value Date   CHOL 186 01/29/2017   HDL 72 01/29/2017   LDLCALC 101 (H) 01/29/2017   TRIG 64 01/29/2017   CHOLHDL 2.6 01/29/2017      Also, the patient is screened expectantly for  PreDiabetes and has had no symptoms of reactive hypoglycemia, diabetic polys, paresthesias or visual blurring.  Last A1c was at goal: Lab Results  Component Value Date   HGBA1C 5.2 01/29/2017      Further, the patient also has history of Vitamin D Deficiency ("9" in 2008) and supplements vitamin D without any suspected side-effects. Last vitamin D was at goal: Lab Results  Component Value Date   VD25OH 82 01/29/2017   Current Outpatient Prescriptions on File Prior to Visit  Medication Sig  . ALPRAZolam (XANAX) 0.5 MG tablet Take 0.5 tablets (0.25 mg total) by mouth 2 (two) times daily as needed for anxiety.  Marland Kitchen aspirin 81 MG tablet Take 81 mg by mouth daily.  Marland Kitchen atenolol (TENORMIN) 50 MG tablet Take 1 tablet (50 mg total) by mouth daily.  . Cholecalciferol (VITAMIN D) 2000 UNITS tablet Take 10,000 Units by mouth daily.   Marland Kitchen estradiol (ESTRACE) 0.5 MG tablet TAKE ONE TABLET BY MOUTH EVERY DAY  . fexofenadine (ALLEGRA) 180 MG tablet Take 180 mg by mouth daily.  . Flaxseed, Linseed, (FLAXSEED OIL) 1000 MG CAPS Take 1 capsule by mouth daily.  Marland Kitchen MAGNESIUM OXIDE PO Take 250 mg by mouth daily.  . Multiple Vitamin (MULTIVITAMIN) tablet Take 1 tablet by mouth daily.  . multivitamin-lutein (OCUVITE-LUTEIN) CAPS capsule Take 1 capsule by  mouth daily.  . Omega-3 Fatty Acids (FISH OIL) 1000 MG CAPS Take 1 capsule by mouth daily.  . ranitidine (ZANTAC) 75 MG tablet Take 75 mg by mouth 2 (two) times daily.   No current facility-administered medications on file prior to visit.    Allergies  Allergen Reactions  . Penicillins Anaphylaxis  . Codeine Nausea And Vomiting  . Sudafed [Pseudoephedrine Hcl]     Makes heart race, per pt    PMHx:   Past Medical History:  Diagnosis Date  . Essential hypertension 10/20/2013  . 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 GERD 10/20/2013  . Hyperlipidemia 10/20/2013  . Migraine, unspecified, without mention of intractable migraine without mention of status migrainosus 10/20/2013  . Morbid obesity (HCC) 11/18/2014  . Prediabetes 10/20/2013  . SVT (supraventricular tachycardia) (HCC) 11/15/2015  . Vitamin D deficiency 10/20/2013   Immunization History  Administered Date(s) Administered  . DTaP 09/12/2013  . Influenza Split 09/12/2013  . Influenza, High Dose Seasonal PF 10/04/2014  . Pneumococcal Conjugate-13 10/04/2014  . Pneumococcal Polysaccharide-23 03/29/2009  . Td 12/20/2003  . Tdap 09/12/2013  . Zoster 06/07/2010   Past Surgical History:  Procedure Laterality Date  . NO PAST SURGERIES     FHx:    Reviewed / unchanged  SHx:    Reviewed / unchanged  Systems Review:  Constitutional: Denies fever, chills, wt changes, headaches, insomnia, fatigue, night sweats, change in appetite. Eyes: Denies redness, blurred  vision, diplopia, discharge, itchy, watery eyes.  ENT: Denies discharge, congestion, post nasal drip, epistaxis, sore throat, earache, hearing loss, dental pain, tinnitus, vertigo, sinus pain, snoring.  CV: Denies chest pain, palpitations, irregular heartbeat, syncope, dyspnea, diaphoresis, orthopnea, PND, claudication or edema. Respiratory: denies cough, dyspnea, DOE, pleurisy, hoarseness, laryngitis, wheezing.  Gastrointestinal: Denies dysphagia, odynophagia, heartburn, reflux, water  brash, abdominal pain or cramps, nausea, vomiting, bloating, diarrhea, constipation, hematemesis, melena, hematochezia  or hemorrhoids. Genitourinary: Denies dysuria, frequency, urgency, nocturia, hesitancy, discharge, hematuria or flank pain. Musculoskeletal: Denies arthralgias, myalgias, stiffness, jt. swelling, pain, limping or strain/sprain.  Skin: Denies pruritus, rash, hives, warts, acne, eczema or change in skin lesion(s). Neuro: No weakness, tremor, incoordination, spasms, paresthesia or pain. Psychiatric: Denies confusion, memory loss or sensory loss. Endo: Denies change in weight, skin or hair change.  Heme/Lymph: No excessive bleeding, bruising or enlarged lymph nodes.  Physical Exam  BP 136/68   Pulse 64   Temp 97.5 F (36.4 C)   Resp 16   Ht 5\' 1"  (1.549 m)   Wt 152 lb (68.9 kg)   BMI 28.72 kg/m   Appears well nourished and in no distress.  Eyes: PERRLA, EOMs, conjunctiva no swelling or erythema. Sinuses: No frontal/maxillary tenderness ENT/Mouth: EAC's clear, TM's nl w/o erythema, bulging. Nares clear w/o erythema, swelling, exudates. Oropharynx clear without erythema or exudates. Oral hygiene is good. Tongue normal, non obstructing. Hearing intact.  Neck: Supple. Thyroid nl. Car 2+/2+ without bruits, nodes or JVD. Chest: Respirations nl with BS clear & equal w/o rales, rhonchi, wheezing or stridor.  Cor: Heart sounds normal w/ regular rate and rhythm without sig. murmurs, gallops, clicks, or rubs. Peripheral pulses normal and equal  without edema.  Abdomen: Soft & bowel sounds normal. Non-tender w/o guarding, rebound, hernias, masses, or organomegaly.  Lymphatics: Unremarkable.  Musculoskeletal: Full ROM all peripheral extremities, joint stability, 5/5 strength, and normal gait.  Skin: Warm, dry without exposed rashes, lesions or ecchymosis apparent.  Neuro: Cranial nerves intact, reflexes equal bilaterally. Sensory-motor testing grossly intact. Tendon reflexes grossly  intact.  Pysch: Alert & oriented x 3.  Insight and judgement nl & appropriate. No ideations.  Assessment and Plan:  1. Essential hypertension  - Continue medication, monitor blood pressure at home.  - Continue DASH diet. Reminder to go to the ER if any CP,  SOB, nausea, dizziness, severe HA, changes vision/speech,  left arm numbness and tingling and jaw pain.  - CBC with Differential/Platelet - BASIC METABOLIC PANEL WITH GFR - Magnesium - TSH  2. Hyperlipidemia  - Continue diet/meds, exercise,& lifestyle modifications.  - Continue monitor periodic cholesterol/liver & renal functions   - Hepatic function panel - Lipid panel - TSH  3. Prediabetes  - Continue diet, exercise, lifestyle modifications.  - Monitor appropriate labs.  - Hemoglobin A1c - Insulin, random  4. Vitamin D deficiency  - Continue supplementation.  - VITAMIN D 25 Hydroxy   5. Medication management  - CBC with Differential/Platelet - BASIC METABOLIC PANEL WITH GFR - Hepatic function panel - Magnesium - Lipid panel - TSH - Hemoglobin A1c - Insulin, random - VITAMIN D 25 Hydroxy        Recommended regular exercise, BP monitoring, weight control, and discussed med and SE's. Recommended labs to assess and monitor clinical status. Further disposition pending results of labs. Over 30 minutes of exam, counseling, chart review was performed

## 2017-01-30 LAB — CBC WITH DIFFERENTIAL/PLATELET
BASOS PCT: 1 %
Basophils Absolute: 59 cells/uL (ref 0–200)
EOS ABS: 236 {cells}/uL (ref 15–500)
Eosinophils Relative: 4 %
HEMATOCRIT: 44.2 % (ref 35.0–45.0)
HEMOGLOBIN: 14.7 g/dL (ref 11.7–15.5)
LYMPHS ABS: 944 {cells}/uL (ref 850–3900)
Lymphocytes Relative: 16 %
MCH: 29.5 pg (ref 27.0–33.0)
MCHC: 33.3 g/dL (ref 32.0–36.0)
MCV: 88.8 fL (ref 80.0–100.0)
MONO ABS: 590 {cells}/uL (ref 200–950)
MPV: 8.9 fL (ref 7.5–12.5)
Monocytes Relative: 10 %
NEUTROS ABS: 4071 {cells}/uL (ref 1500–7800)
Neutrophils Relative %: 69 %
Platelets: 335 10*3/uL (ref 140–400)
RBC: 4.98 MIL/uL (ref 3.80–5.10)
RDW: 13.3 % (ref 11.0–15.0)
WBC: 5.9 10*3/uL (ref 3.8–10.8)

## 2017-01-30 LAB — VITAMIN D 25 HYDROXY (VIT D DEFICIENCY, FRACTURES): Vit D, 25-Hydroxy: 82 ng/mL (ref 30–100)

## 2017-01-30 LAB — LIPID PANEL
Cholesterol: 186 mg/dL (ref <200)
HDL: 72 mg/dL (ref >50)
LDL CALC: 101 mg/dL — AB (ref <100)
TRIGLYCERIDES: 64 mg/dL (ref <150)
Total CHOL/HDL Ratio: 2.6 Ratio (ref <5.0)
VLDL: 13 mg/dL (ref <30)

## 2017-01-30 LAB — BASIC METABOLIC PANEL WITH GFR
BUN: 13 mg/dL (ref 7–25)
CHLORIDE: 103 mmol/L (ref 98–110)
CO2: 24 mmol/L (ref 20–31)
Calcium: 9.5 mg/dL (ref 8.6–10.4)
Creat: 0.95 mg/dL — ABNORMAL HIGH (ref 0.60–0.93)
GFR, EST NON AFRICAN AMERICAN: 61 mL/min (ref >=60)
GFR, Est African American: 70 mL/min (ref >=60)
GLUCOSE: 89 mg/dL (ref 65–99)
POTASSIUM: 4.2 mmol/L (ref 3.5–5.3)
Sodium: 135 mmol/L (ref 135–146)

## 2017-01-30 LAB — HEPATIC FUNCTION PANEL
ALBUMIN: 4.2 g/dL (ref 3.6–5.1)
ALT: 16 U/L (ref 6–29)
AST: 26 U/L (ref 10–35)
Alkaline Phosphatase: 73 U/L (ref 33–130)
BILIRUBIN INDIRECT: 0.3 mg/dL (ref 0.2–1.2)
Bilirubin, Direct: 0.1 mg/dL (ref <=0.2)
TOTAL PROTEIN: 6.6 g/dL (ref 6.1–8.1)
Total Bilirubin: 0.4 mg/dL (ref 0.2–1.2)

## 2017-01-30 LAB — INSULIN, RANDOM: Insulin: 4.1 u[IU]/mL (ref 2.0–19.6)

## 2017-01-30 LAB — MAGNESIUM: Magnesium: 1.9 mg/dL (ref 1.5–2.5)

## 2017-01-30 LAB — HEMOGLOBIN A1C
Hgb A1c MFr Bld: 5.2 % (ref <5.7)
MEAN PLASMA GLUCOSE: 103 mg/dL

## 2017-01-30 LAB — TSH: TSH: 1.47 m[IU]/L

## 2017-02-02 ENCOUNTER — Encounter: Payer: Self-pay | Admitting: Internal Medicine

## 2017-05-02 ENCOUNTER — Encounter: Payer: Self-pay | Admitting: Gastroenterology

## 2017-05-08 NOTE — Progress Notes (Signed)
MEDICARE ANNUAL WELLNESS VISIT AND FOLLOW UP  Assessment:    Essential hypertension - continue medications, DASH diet, exercise and monitor at home. Call if greater than 130/80.  -     CBC with Differential/Platelet -     BASIC METABOLIC PANEL WITH GFR -     Hepatic function panel -     TSH  Migraine without status migrainosus, not intractable, unspecified migraine type Avoid triggers  SVT (supraventricular tachycardia) (HCC) Continue cardio follow up No symptoms at this time  GERD Continue PPI/H2 blocker, diet discussed  Hyperlipidemia -continue medications, check lipids, decrease fatty foods, increase activity.  -     Lipid panel  Prediabetes Discussed general issues about diabetes pathophysiology and management., Educational material distributed., Suggested low cholesterol diet., Encouraged aerobic exercise., Discussed foot care., Reminded to get yearly retinal exam.  Vitamin D deficiency -     VITAMIN D 25 Hydroxy (Vit-D Deficiency, Fractures)  Facial neuralgia Continue follow up /medications  Medication management -     Magnesium  Morbid Obesity with co morbidities - long discussion about weight loss, diet, and exercise  Estrogen deficiency -     Discontinue: Estradiol-Norethindrone Acet (ACTIVELLA) 0.5-0.1 MG tablet; Take 1 tablet by mouth daily. -     US Transvaginal Non-OB; Future -     US Pelvis Complete; Future  Encounter for Medicare annual wellness exam  Abdominal fullness in suprapubic region -     US Transvaginal Non-OB; Future -     US Pelvis Complete; Future  Estrogen def She has NOT had a hysterectomy, we will start her on progesterone with the estrogen and check a vaginal US. Risks of clotting and breast cancer were discussed and she understands, she also understands that she needs to be on the progesterone AND the estrogen. In addition, the cardiovascular,bone, and other benefits were discussed.  No personal history of breast CA or blood clots,  she is be on 81mg  aspirin and gest mammograms regularly.  -     progesterone (PROMETRIUM) 100 MG capsule; Take daily or with estrogen as discussed -     estradiol (ESTRACE) 0.5 MG tablet; Take 1 tablet (0.5 mg total) by mouth daily.   Over 30 minutes of exam, counseling, chart review, and critical decision making was performed  Plan:   During the course of the visit the patient was educated and counseled about appropriate screening and preventive services including:    Pneumococcal vaccine   Influenza vaccine  Td vaccine  Prevnar 13  Screening electrocardiogram  Screening mammography  Bone densitometry screening  Colorectal cancer screening  Diabetes screening  Glaucoma screening  Nutrition counseling   Advanced directives: given info/requested copies   Subjective:   Jean Drake is a 71 y.o. female who presents for Medicare Annual Wellness Visit and 3 month follow up on hypertension, prediabetes, hyperlipidemia, vitamin D def.   Her blood pressure has been controlled at home, today their BP is BP: 120/74 She does not workout. She denies chest pain, shortness of breath, dizziness.  She notes that she still has the occasional palpitation but she takes an extra atenolol and aspirin.   She is on estrace every other day she is 2170, she has not had hysterectomy. She would like to stay on it, she has not had any suprapubic fullness, no bleeding, no bloating. Willing to get on progesterone/estrogen.  She is on cholesterol medication and denies myalgias. Her cholesterol is at goal. The cholesterol last visit was:   Lab Results  Component Value Date   CHOL 186 01/29/2017   HDL 72 01/29/2017   LDLCALC 101 (H) 01/29/2017   TRIG 64 01/29/2017   CHOLHDL 2.6 01/29/2017    Last A1C in the office was:  Lab Results  Component Value Date   HGBA1C 5.2 01/29/2017   Last GFR Lab Results  Component Value Date   GFRNONAA 61 01/29/2017   Patient is on Vitamin D supplement. Lab  Results  Component Value Date   VD25OH 82 01/29/2017     BMI is Body mass index is 28.76 kg/m., she is working on diet and exercise. Wt Readings from Last 3 Encounters:  05/10/17 152 lb 3.2 oz (69 kg)  01/29/17 152 lb (68.9 kg)  10/29/16 152 lb (68.9 kg)    Medication Review Current Outpatient Prescriptions on File Prior to Visit  Medication Sig Dispense Refill  . ALPRAZolam (XANAX) 0.5 MG tablet Take 0.5 tablets (0.25 mg total) by mouth 2 (two) times daily as needed for anxiety. 60 tablet 3  . aspirin 81 MG tablet Take 81 mg by mouth daily.    Marland Kitchen atenolol (TENORMIN) 50 MG tablet Take 1 tablet (50 mg total) by mouth daily. 90 tablet 1  . Cholecalciferol (VITAMIN D) 2000 UNITS tablet Take 10,000 Units by mouth daily.     Marland Kitchen estradiol (ESTRACE) 0.5 MG tablet TAKE ONE TABLET BY MOUTH EVERY DAY 90 tablet 1  . fexofenadine (ALLEGRA) 180 MG tablet Take 180 mg by mouth daily.    Marland Kitchen MAGNESIUM OXIDE PO Take 250 mg by mouth daily.    . Multiple Vitamin (MULTIVITAMIN) tablet Take 1 tablet by mouth daily.    . multivitamin-lutein (OCUVITE-LUTEIN) CAPS capsule Take 1 capsule by mouth daily.    . Omega-3 Fatty Acids (FISH OIL) 1000 MG CAPS Take 1 capsule by mouth daily.    . ranitidine (ZANTAC) 75 MG tablet Take 75 mg by mouth 2 (two) times daily.     No current facility-administered medications on file prior to visit.     Current Problems (verified) Patient Active Problem List   Diagnosis Date Noted  . SVT (supraventricular tachycardia) (HCC) 11/15/2015  . Morbid obesity (HCC) 11/18/2014  . Medication management 04/20/2014  . Essential hypertension 10/20/2013  . Hyperlipidemia 10/20/2013  . Prediabetes 10/20/2013  . Vitamin D deficiency 10/20/2013  . GERD 10/20/2013  . Migraine headache 10/20/2013  . 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 DTaP 09/12/2013  . Influenza Split 09/12/2013  . Influenza, High Dose Seasonal  PF 10/04/2014  . Pneumococcal Conjugate-13 10/04/2014  . Pneumococcal Polysaccharide-23 03/29/2009  . Td 12/20/2003  . Tdap 09/12/2013  . Zoster 06/07/2010    Preventative care: Last colonoscopy: 2008 due has been 10 years Last mammogram: 07/2016 DEXA: 2017 Echo 12/2015  Prior vaccinations: TD or Tdap: 2014  Influenza: 2017  Pneumococcal: 2010 Prevnar13: 2015 Shingles/Zostavax: 2011  Names of Other Physician/Practitioners you currently use: 1. [facility]- here for primary care 2. Dr. Dione Booze , eye doctor, last visit 2016 3. Dr. Lelon Perla , dentist, last visit 04/2017 Patient Care Team: Lucky Cowboy, MD as PCP - General (Internal Medicine) Louis Meckel, MD as Consulting Physician (Gastroenterology) Huel Cote, MD as Consulting Physician (Obstetrics and Gynecology)  Allergies Allergies  Allergen Reactions  . Penicillins Anaphylaxis  . Codeine Nausea And Vomiting  . Sudafed [Pseudoephedrine Hcl]     Makes heart race, per pt     SURGICAL HISTORY  She  has a past surgical history that includes No past surgeries. FAMILY HISTORY Her family history includes Heart disease in her mother. SOCIAL HISTORY She  reports that she has never smoked. She has never used smokeless tobacco. She reports that she does not drink alcohol.  MEDICARE WELLNESS OBJECTIVES: Physical activity: Current Exercise Habits: Home exercise routine, Type of exercise: walking, Time (Minutes): 20, Frequency (Times/Week): 3, Weekly Exercise (Minutes/Week): 60, Intensity: Mild Cardiac risk factors: Cardiac Risk Factors include: advanced age (>53men, >44 women);dyslipidemia;hypertension;sedentary lifestyle;obesity (BMI >30kg/m2) Depression/mood screen:   Depression screen [facility] 2/9 05/10/2017  Decreased Interest 0  Down, Depressed, Hopeless 0  PHQ - 2 Score 0    ADLs:  In your present state of health, do you have any difficulty performing the following  activities: 05/10/2017 02/02/2017  Hearing? N N  Vision? N N  Difficulty concentrating or making decisions? N N  Walking or climbing stairs? N N  Dressing or bathing? N N  Doing errands, shopping? N N  Some recent data might be hidden    Cognitive Testing  Alert? Yes  Normal Appearance?Yes  Oriented to person? Yes  Place? Yes   Time? Yes  Recall of three objects?  Yes  Can perform simple calculations? Yes  Displays appropriate judgment?Yes  Can read the correct time from a watch face?Yes  EOL planning: Does Patient Have a Medical Advance Directive?: Yes   Objective:   Today's Vitals   05/10/17 1034  BP: 120/74  Pulse: 61  Resp: 14  Temp: 98.1 F (36.7 C)  SpO2: 97%  Weight: 152 lb 3.2 oz (69 kg)  Height: 5\' 1"  (1.549 m)   Body mass index is 28.76 kg/m.  General appearance: alert, no distress, WD/WN,  female HEENT: normocephalic, sclerae anicteric, TMs pearly, nares patent, no discharge or erythema, pharynx normal Oral cavity: MMM, no lesions Neck: supple, no lymphadenopathy, no thyromegaly, no masses Heart: RRR, normal S1, S2, no murmurs Lungs: CTA bilaterally, no wheezes, rhonchi, or rales Abdomen: +bs, soft, non tender, + distended/full lower AB, no masses, no hepatomegaly, no splenomegaly Musculoskeletal: nontender, no swelling, no obvious deformity Extremities: no edema, no cyanosis, no clubbing Pulses: 2+ symmetric, upper and lower extremities, normal cap refill Neurological: alert, oriented x 3, CN2-12 intact, strength normal upper extremities and lower extremities, sensation normal throughout, DTRs 2+ throughout, no cerebellar signs, gait normal Psychiatric: normal affect, behavior normal, pleasant  Breast: defer Gyn: defer Rectal: defer   Medicare Attestation I have personally reviewed: The patient's medical and social history Their use of alcohol, tobacco or illicit drugs Their current medications and supplements The patient's functional ability  including ADLs,fall risks, home safety risks, cognitive, and hearing and visual impairment Diet and physical activities Evidence for depression or mood disorders  The patient's weight, height, BMI, and visual acuity have been recorded in the chart.  I have made referrals, counseling, and provided education to the patient based on review of the above and I have provided the patient with a written personalized care plan for preventive services.     Quentin Mulling, PA-C   05/10/2017

## 2017-05-10 ENCOUNTER — Ambulatory Visit (INDEPENDENT_AMBULATORY_CARE_PROVIDER_SITE_OTHER): Payer: Medicare Other | Admitting: Physician Assistant

## 2017-05-10 ENCOUNTER — Encounter: Payer: Self-pay | Admitting: Physician Assistant

## 2017-05-10 VITALS — BP 120/74 | HR 61 | Temp 98.1°F | Resp 14 | Ht 61.0 in | Wt 152.2 lb

## 2017-05-10 DIAGNOSIS — Z0000 Encounter for general adult medical examination without abnormal findings: Secondary | ICD-10-CM

## 2017-05-10 DIAGNOSIS — G518 Other disorders of facial nerve: Secondary | ICD-10-CM | POA: Diagnosis not present

## 2017-05-10 DIAGNOSIS — Z79899 Other long term (current) drug therapy: Secondary | ICD-10-CM | POA: Diagnosis not present

## 2017-05-10 DIAGNOSIS — E2839 Other primary ovarian failure: Secondary | ICD-10-CM | POA: Diagnosis not present

## 2017-05-10 DIAGNOSIS — I471 Supraventricular tachycardia: Secondary | ICD-10-CM

## 2017-05-10 DIAGNOSIS — K2100 Gastro-esophageal reflux disease with esophagitis, without bleeding: Secondary | ICD-10-CM

## 2017-05-10 DIAGNOSIS — R7303 Prediabetes: Secondary | ICD-10-CM

## 2017-05-10 DIAGNOSIS — R6889 Other general symptoms and signs: Secondary | ICD-10-CM | POA: Diagnosis not present

## 2017-05-10 DIAGNOSIS — G43909 Migraine, unspecified, not intractable, without status migrainosus: Secondary | ICD-10-CM

## 2017-05-10 DIAGNOSIS — E782 Mixed hyperlipidemia: Secondary | ICD-10-CM | POA: Diagnosis not present

## 2017-05-10 DIAGNOSIS — R198 Other specified symptoms and signs involving the digestive system and abdomen: Secondary | ICD-10-CM | POA: Diagnosis not present

## 2017-05-10 DIAGNOSIS — E559 Vitamin D deficiency, unspecified: Secondary | ICD-10-CM

## 2017-05-10 DIAGNOSIS — Z0001 Encounter for general adult medical examination with abnormal findings: Secondary | ICD-10-CM

## 2017-05-10 DIAGNOSIS — I10 Essential (primary) hypertension: Secondary | ICD-10-CM | POA: Diagnosis not present

## 2017-05-10 LAB — CBC WITH DIFFERENTIAL/PLATELET
BASOS PCT: 1 %
Basophils Absolute: 57 cells/uL (ref 0–200)
EOS PCT: 2 %
Eosinophils Absolute: 114 cells/uL (ref 15–500)
HCT: 42.5 % (ref 35.0–45.0)
Hemoglobin: 14 g/dL (ref 11.7–15.5)
Lymphocytes Relative: 20 %
Lymphs Abs: 1140 cells/uL (ref 850–3900)
MCH: 29.5 pg (ref 27.0–33.0)
MCHC: 32.9 g/dL (ref 32.0–36.0)
MCV: 89.5 fL (ref 80.0–100.0)
MPV: 8.9 fL (ref 7.5–12.5)
Monocytes Absolute: 741 cells/uL (ref 200–950)
Monocytes Relative: 13 %
NEUTROS ABS: 3648 {cells}/uL (ref 1500–7800)
Neutrophils Relative %: 64 %
PLATELETS: 301 10*3/uL (ref 140–400)
RBC: 4.75 MIL/uL (ref 3.80–5.10)
RDW: 13.7 % (ref 11.0–15.0)
WBC: 5.7 10*3/uL (ref 3.8–10.8)

## 2017-05-10 LAB — LIPID PANEL
CHOL/HDL RATIO: 3.1 ratio (ref <5.0)
Cholesterol: 217 mg/dL — ABNORMAL HIGH (ref <200)
HDL: 71 mg/dL (ref >50)
LDL CALC: 123 mg/dL — AB (ref <100)
TRIGLYCERIDES: 117 mg/dL (ref <150)
VLDL: 23 mg/dL (ref <30)

## 2017-05-10 LAB — TSH: TSH: 1.52 m[IU]/L

## 2017-05-10 LAB — HEPATIC FUNCTION PANEL
ALK PHOS: 89 U/L (ref 33–130)
ALT: 25 U/L (ref 6–29)
AST: 30 U/L (ref 10–35)
Albumin: 4.3 g/dL (ref 3.6–5.1)
BILIRUBIN DIRECT: 0.1 mg/dL (ref <=0.2)
Indirect Bilirubin: 0.3 mg/dL (ref 0.2–1.2)
Total Bilirubin: 0.4 mg/dL (ref 0.2–1.2)
Total Protein: 6.7 g/dL (ref 6.1–8.1)

## 2017-05-10 LAB — BASIC METABOLIC PANEL WITH GFR
BUN: 16 mg/dL (ref 7–25)
CHLORIDE: 106 mmol/L (ref 98–110)
CO2: 25 mmol/L (ref 20–31)
Calcium: 9.7 mg/dL (ref 8.6–10.4)
Creat: 0.93 mg/dL (ref 0.60–0.93)
GFR, Est African American: 72 mL/min (ref >=60)
GFR, Est Non African American: 62 mL/min (ref >=60)
Glucose, Bld: 91 mg/dL (ref 65–99)
POTASSIUM: 4.8 mmol/L (ref 3.5–5.3)
SODIUM: 139 mmol/L (ref 135–146)

## 2017-05-10 MED ORDER — PROGESTERONE MICRONIZED 100 MG PO CAPS: ORAL_CAPSULE | ORAL | 3 refills | Status: DC

## 2017-05-10 MED ORDER — ESTRADIOL-NORETHINDRONE ACET 0.5-0.1 MG PO TABS: 1.0000 | ORAL_TABLET | Freq: Every day | ORAL | 3 refills | Status: DC

## 2017-05-10 MED ORDER — ESTRADIOL 0.5 MG PO TABS: 0.5000 mg | ORAL_TABLET | Freq: Every day | ORAL | 1 refills | Status: DC

## 2017-05-10 NOTE — Patient Instructions (Signed)
Risks of clotting and breast cancer were discussed and she understands. In addition, the cardiovascular,bone, and other benefits were discussed. Continue 81mg  aspirin and she will get mammograms regularly.

## 2017-05-11 LAB — VITAMIN D 25 HYDROXY (VIT D DEFICIENCY, FRACTURES): Vit D, 25-Hydroxy: 63 ng/mL (ref 30–100)

## 2017-05-11 LAB — MAGNESIUM: MAGNESIUM: 2.1 mg/dL (ref 1.5–2.5)

## 2017-05-13 NOTE — Progress Notes (Signed)
Pt aware of lab results & voiced understanding of those results.

## 2017-05-13 NOTE — Progress Notes (Signed)
PROGESTERONE WAS CALLED INTO PHARMACY ON 2ND July 2018 @ 12:05PM BY DD

## 2017-05-22 ENCOUNTER — Ambulatory Visit
Admission: RE | Admit: 2017-05-22 | Discharge: 2017-05-22 | Disposition: A | Discharge status: Home / Self Care | Payer: Medicare Other | Source: Ambulatory Visit | Attending: Physician Assistant | Admitting: Physician Assistant

## 2017-05-22 DIAGNOSIS — E2839 Other primary ovarian failure: Secondary | ICD-10-CM

## 2017-05-22 DIAGNOSIS — D259 Leiomyoma of uterus, unspecified: Secondary | ICD-10-CM | POA: Diagnosis not present

## 2017-05-22 DIAGNOSIS — R198 Other specified symptoms and signs involving the digestive system and abdomen: Secondary | ICD-10-CM

## 2017-05-23 ENCOUNTER — Other Ambulatory Visit: Payer: Self-pay

## 2017-05-23 ENCOUNTER — Other Ambulatory Visit: Payer: Self-pay | Admitting: Physician Assistant

## 2017-05-23 ENCOUNTER — Ambulatory Visit
Admission: RE | Admit: 2017-05-23 | Discharge: 2017-05-23 | Disposition: A | Discharge status: Home / Self Care | Payer: Medicare Other | Source: Ambulatory Visit | Attending: Physician Assistant | Admitting: Physician Assistant

## 2017-05-23 DIAGNOSIS — R1900 Intra-abdominal and pelvic swelling, mass and lump, unspecified site: Secondary | ICD-10-CM

## 2017-05-23 DIAGNOSIS — K661 Hemoperitoneum: Secondary | ICD-10-CM | POA: Diagnosis not present

## 2017-05-23 MED ORDER — IOPAMIDOL (ISOVUE-300) INJECTION 61%
100.0000 mL | Freq: Once | INTRAVENOUS | Status: AC | PRN
  Administered 2017-05-23: 100 mL via INTRAVENOUS

## 2017-05-29 DIAGNOSIS — S301XXA Contusion of abdominal wall, initial encounter: Secondary | ICD-10-CM | POA: Diagnosis not present

## 2017-06-10 DIAGNOSIS — R938 Abnormal findings on diagnostic imaging of other specified body structures: Secondary | ICD-10-CM | POA: Diagnosis not present

## 2017-06-10 DIAGNOSIS — R1900 Intra-abdominal and pelvic swelling, mass and lump, unspecified site: Secondary | ICD-10-CM | POA: Diagnosis not present

## 2017-07-18 ENCOUNTER — Other Ambulatory Visit: Payer: Self-pay | Admitting: Internal Medicine

## 2017-07-18 DIAGNOSIS — Z1231 Encounter for screening mammogram for malignant neoplasm of breast: Secondary | ICD-10-CM

## 2017-08-09 DIAGNOSIS — R897 Abnormal histological findings in specimens from other organs, systems and tissues: Secondary | ICD-10-CM | POA: Diagnosis not present

## 2017-08-09 DIAGNOSIS — N9489 Other specified conditions associated with female genital organs and menstrual cycle: Secondary | ICD-10-CM | POA: Diagnosis not present

## 2017-08-09 DIAGNOSIS — Z124 Encounter for screening for malignant neoplasm of cervix: Secondary | ICD-10-CM | POA: Diagnosis not present

## 2017-08-12 ENCOUNTER — Ambulatory Visit
Admission: RE | Admit: 2017-08-12 | Discharge: 2017-08-12 | Disposition: A | Discharge status: Home / Self Care | Payer: Medicare Other | Source: Ambulatory Visit | Attending: Internal Medicine | Admitting: Internal Medicine

## 2017-08-12 DIAGNOSIS — Z1231 Encounter for screening mammogram for malignant neoplasm of breast: Secondary | ICD-10-CM

## 2017-08-30 ENCOUNTER — Ambulatory Visit (INDEPENDENT_AMBULATORY_CARE_PROVIDER_SITE_OTHER): Payer: Medicare Other | Admitting: Internal Medicine

## 2017-08-30 VITALS — BP 134/74 | HR 64 | Temp 97.4°F | Resp 18 | Ht 61.0 in | Wt 152.0 lb

## 2017-08-30 DIAGNOSIS — K2100 Gastro-esophageal reflux disease with esophagitis, without bleeding: Secondary | ICD-10-CM

## 2017-08-30 DIAGNOSIS — E782 Mixed hyperlipidemia: Secondary | ICD-10-CM | POA: Diagnosis not present

## 2017-08-30 DIAGNOSIS — R7303 Prediabetes: Secondary | ICD-10-CM | POA: Diagnosis not present

## 2017-08-30 DIAGNOSIS — Z136 Encounter for screening for cardiovascular disorders: Secondary | ICD-10-CM

## 2017-08-30 DIAGNOSIS — Z1211 Encounter for screening for malignant neoplasm of colon: Secondary | ICD-10-CM

## 2017-08-30 DIAGNOSIS — Z23 Encounter for immunization: Secondary | ICD-10-CM | POA: Diagnosis not present

## 2017-08-30 DIAGNOSIS — E559 Vitamin D deficiency, unspecified: Secondary | ICD-10-CM

## 2017-08-30 DIAGNOSIS — I10 Essential (primary) hypertension: Secondary | ICD-10-CM

## 2017-08-30 DIAGNOSIS — Z0001 Encounter for general adult medical examination with abnormal findings: Secondary | ICD-10-CM

## 2017-08-30 DIAGNOSIS — Z79899 Other long term (current) drug therapy: Secondary | ICD-10-CM

## 2017-08-30 NOTE — Patient Instructions (Signed)

## 2017-08-30 NOTE — Progress Notes (Signed)
[facility] Lucky CowboyWilliam Holden Maniscalco, M.D.     Dyanne CarrelAmanda R. Steffanie Dunnollier, P.A.-C Judd GaudierAshley Corbett, DNP [facility] [address] [place], [place] [id number] Telephone [phone] Telefax [phone] Annual Screening/Preventative Visit & Comprehensive Evaluation &  Examination     This very nice 71 y.o. MWF presents for a Screening/Preventative Visit & comprehensive evaluation and management of multiple medical co-morbidities.  Patient has been followed for HTN, Prediabetes, Hyperlipidemia and Vitamin D Deficiency.      HTN predates since 2012. Patient's BP have been running higher at home and patient denies any cardiac symptoms as chest pain, palpitations, shortness of breath, dizziness or ankle swelling. Today's BP was initially elevated and on recheck is at goal 134/74.      Patient's hyperlipidemia is controlled with diet and medications. Patient denies myalgias or other medication SE's. Last lipids were  Lab Results  Component Value Date   CHOL 206 (H) 08/30/2017   HDL 75 08/30/2017   LDLCALC 123 (H) 05/10/2017   TRIG 101 08/30/2017   CHOLHDL 2.7 08/30/2017      Patient is followed expectantly for prediabetes and patient denies reactive hypoglycemic symptoms, visual blurring, diabetic polys, or paresthesias. Current A1c is at goal: Lab Results  Component Value Date   HGBA1C 5.4 08/30/2017      Finally, patient has history of Vitamin D Deficiency 4291f "9" in 2009 and current Vitamin D is at goal: Lab Results  Component Value Date   VD25OH 69 08/30/2017   Current Outpatient Prescriptions on File Prior to Visit  Medication Sig  . ALPRAZolam (XANAX) 0.5 MG tablet Take 0.5 tablets (0.25 mg total) by mouth 2 (two) times daily as needed for anxiety.  Marland Kitchen. aspirin 81 MG tablet Take 81 mg by mouth daily.  Marland Kitchen. atenolol (TENORMIN) 50 MG tablet Take 1 tablet (50 mg total) by mouth daily.  . Cholecalciferol (VITAMIN D) 2000 UNITS tablet Take  4,000 Units by mouth daily.   . fexofenadine (ALLEGRA) 180 MG tablet Take 180 mg by mouth daily.  Marland Kitchen. MAGNESIUM OXIDE PO Take 250 mg by mouth daily.  . Melatonin 3 MG TABS Take by mouth.  . Multiple Vitamin (MULTIVITAMIN) tablet Take 1 tablet by mouth daily.  . multivitamin-lutein (OCUVITE-LUTEIN) CAPS capsule Take 1 capsule by mouth daily.  . Omega-3 Fatty Acids (FISH OIL) 1000 MG CAPS Take 1 capsule by mouth daily.  . ranitidine (ZANTAC) 75 MG tablet Take 75 mg by mouth 2 (two) times daily.   No current facility-administered medications on file prior to visit.    Allergies  Allergen Reactions  . Penicillins Anaphylaxis  . Codeine Nausea And Vomiting  . Sudafed [Pseudoephedrine Hcl]     Makes heart race, per pt    Past Medical History:  Diagnosis Date  . Essential hypertension 10/20/2013  . 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 GERD 10/20/2013  . Hyperlipidemia 10/20/2013  . Migraine, unspecified, without mention of intractable migraine without mention of status migrainosus 10/20/2013  . Morbid obesity (HCC) 11/18/2014  . Prediabetes 10/20/2013  . SVT (supraventricular tachycardia) (HCC) 11/15/2015  . Vitamin D deficiency 10/20/2013   Health Maintenance  Topic Date Due  . PNA vac Low Risk Adult (2 of 2 - PPSV23) 10/05/2015  . COLONOSCOPY  06/12/2017  . MAMMOGRAM  08/13/2019  . TETANUS/TDAP  09/13/2023  . INFLUENZA VACCINE  Completed  . DEXA SCAN  Completed  . Hepatitis C Screening  Completed   Immunization History  Administered Date(s) Administered  .  DTaP 09/12/2013  . Influenza Split 09/12/2013  . Influenza, High Dose Seasonal PF 10/04/2014, 08/30/2017  . Pneumococcal Conjugate-13 10/04/2014  . Pneumococcal Polysaccharide-23 03/29/2009  . Td 12/20/2003  . Tdap 09/12/2013  . Zoster 06/07/2010   Past Surgical History:  Procedure Laterality Date  . NO PAST SURGERIES     Family History  Problem Relation Age of Onset  . Heart disease Mother   . Breast cancer Mother    Social  History  Substance Use Topics  . Smoking status: Never Smoker  . Smokeless tobacco: Never Used  . Alcohol use No    ROS Constitutional: Denies fever, chills, weight loss/gain, headaches, insomnia,  night sweats, and change in appetite. Does c/o fatigue. Eyes: Denies redness, blurred vision, diplopia, discharge, itchy, watery eyes.  ENT: Denies discharge, congestion, post nasal drip, epistaxis, sore throat, earache, hearing loss, dental pain, Tinnitus, Vertigo, Sinus pain, snoring.  Cardio: Denies chest pain, palpitations, irregular heartbeat, syncope, dyspnea, diaphoresis, orthopnea, PND, claudication, edema Respiratory: denies cough, dyspnea, DOE, pleurisy, hoarseness, laryngitis, wheezing.  Gastrointestinal: Denies dysphagia, heartburn, reflux, water brash, pain, cramps, nausea, vomiting, bloating, diarrhea, constipation, hematemesis, melena, hematochezia, jaundice, hemorrhoids Genitourinary: Denies dysuria, frequency, urgency, nocturia, hesitancy, discharge, hematuria, flank pain Breast: Breast lumps, nipple discharge, bleeding.  Musculoskeletal: Denies arthralgia, myalgia, stiffness, Jt. Swelling, pain, limp, and strain/sprain. Denies falls. Skin: Denies puritis, rash, hives, warts, acne, eczema, changing in skin lesion Neuro: No weakness, tremor, incoordination, spasms, paresthesia, pain Psychiatric: Denies confusion, memory loss, sensory loss. Denies Depression. Endocrine: Denies change in weight, skin, hair change, nocturia, and paresthesia, diabetic polys, visual blurring, hyper / hypo glycemic episodes.  Heme/Lymph: No excessive bleeding, bruising, enlarged lymph nodes.  Physical Exam  BP 134/74   Pulse 64   Temp (!) 97.4 F (36.3 C)   Resp 18   Ht 5\' 1"  (1.549 m)   Wt 152 lb (68.9 kg)   BMI 28.72 kg/m   General Appearance: Well nourished, well groomed and in no apparent distress.  Eyes: PERRLA, EOMs, conjunctiva no swelling or erythema, normal fundi and  vessels. Sinuses: No frontal/maxillary tenderness ENT/Mouth: EACs patent / TMs  nl. Nares clear without erythema, swelling, mucoid exudates. Oral hygiene is good. No erythema, swelling, or exudate. Tongue normal, non-obstructing. Tonsils not swollen or erythematous. Hearing normal.  Neck: Supple, thyroid normal. No bruits, nodes or JVD. Respiratory: Respiratory effort normal.  BS equal and clear bilateral without rales, rhonci, wheezing or stridor. Cardio: Heart sounds are normal with regular rate and rhythm and no murmurs, rubs or gallops. Peripheral pulses are normal and equal bilaterally without edema. No aortic or femoral bruits. Chest: symmetric with normal excursions and percussion. Breasts: Symmetric, without lumps, nipple discharge, retractions, or fibrocystic changes.  Abdomen: Flat, soft with bowel sounds active. Nontender, no guarding, rebound, hernias, masses, or organomegaly.  Lymphatics: Non tender without lymphadenopathy.  Musculoskeletal: Full ROM all peripheral extremities, joint stability, 5/5 strength, and normal gait. Skin: Warm and dry without rashes, lesions, cyanosis, clubbing or  ecchymosis.  Neuro: Cranial nerves intact, reflexes equal bilaterally. Normal muscle tone, no cerebellar symptoms. Sensation intact.  Pysch: Alert and oriented X 3, normal affect, Insight and Judgment appropriate.   Assessment and Plan  1. Annual Preventative Screening Examination  2. Essential hypertension  -Advised increase Atenolol 50  to 1 whole tablet daily and continue monitoring of BP's.  - EKG 12-Lead - Microalbumin / creatinine urine ratio - CBC with Differential/Platelet - BASIC METABOLIC PANEL WITH GFR - Magnesium - TSH - Urinalysis,  Routine w reflex microscopic  3. Hyperlipidemia, mixed  - EKG 12-Lead - Hepatic function panel - Lipid panel - TSH  4. Prediabetes  - EKG 12-Lead - Hemoglobin A1c - Insulin, random  5. Vitamin D deficiency  - VITAMIN D 25  Hydroxy  6. GERD  - CBC with Differential/Platelet  7. Colon cancer screening  - POC Hemoccult Bld/Stl   8. Screening for ischemic heart disease  - EKG 12-Lead  9. Medication management  - Microalbumin / creatinine urine ratio - CBC with Differential/Platelet - BASIC METABOLIC PANEL WITH GFR - Hepatic function panel - Magnesium - Lipid panel - TSH - Hemoglobin A1c - Insulin, random - VITAMIN D 25 Hydroxy  - Urinalysis, Routine w reflex microscopic  10. Need for immunization against influenza  - Flu vaccine HIGH DOSE PF (Fluzone High dose)           Patient was counseled in prudent diet to achieve/maintain BMI less than 25 for weight control, BP monitoring, regular exercise and medications. Discussed med's effects and SE's. Screening labs and tests as requested with regular follow-up as recommended. Over 40 minutes of exam, counseling, chart review and high complex critical decision making was performed.

## 2017-08-31 ENCOUNTER — Encounter: Payer: Self-pay | Admitting: Internal Medicine

## 2017-09-02 LAB — CBC WITH DIFFERENTIAL/PLATELET
BASOS ABS: 69 {cells}/uL (ref 0–200)
Basophils Relative: 1.3 %
Eosinophils Absolute: 148 cells/uL (ref 15–500)
Eosinophils Relative: 2.8 %
HEMATOCRIT: 42.3 % (ref 35.0–45.0)
Hemoglobin: 14.1 g/dL (ref 11.7–15.5)
LYMPHS ABS: 880 {cells}/uL (ref 850–3900)
MCH: 29.3 pg (ref 27.0–33.0)
MCHC: 33.3 g/dL (ref 32.0–36.0)
MCV: 87.8 fL (ref 80.0–100.0)
MPV: 9.4 fL (ref 7.5–12.5)
Monocytes Relative: 10.2 %
NEUTROS PCT: 69.1 %
Neutro Abs: 3662 cells/uL (ref 1500–7800)
PLATELETS: 298 10*3/uL (ref 140–400)
RBC: 4.82 10*6/uL (ref 3.80–5.10)
RDW: 12.3 % (ref 11.0–15.0)
TOTAL LYMPHOCYTE: 16.6 %
WBC: 5.3 10*3/uL (ref 3.8–10.8)
WBCMIX: 541 {cells}/uL (ref 200–950)

## 2017-09-02 LAB — BASIC METABOLIC PANEL WITH GFR
BUN/Creatinine Ratio: 17 (calc) (ref 6–22)
BUN: 16 mg/dL (ref 7–25)
CALCIUM: 9.6 mg/dL (ref 8.6–10.4)
CHLORIDE: 105 mmol/L (ref 98–110)
CO2: 26 mmol/L (ref 20–32)
Creat: 0.96 mg/dL — ABNORMAL HIGH (ref 0.60–0.93)
GFR, Est African American: 69 mL/min/{1.73_m2} (ref >=60)
GFR, Est Non African American: 60 mL/min/{1.73_m2} (ref >=60)
GLUCOSE: 92 mg/dL (ref 65–99)
Potassium: 4.8 mmol/L (ref 3.5–5.3)
Sodium: 141 mmol/L (ref 135–146)

## 2017-09-02 LAB — HEMOGLOBIN A1C
HEMOGLOBIN A1C: 5.4 %{Hb} (ref <5.7)
Mean Plasma Glucose: 108 (calc)
eAG (mmol/L): 6 (calc)

## 2017-09-02 LAB — HEPATIC FUNCTION PANEL
AG Ratio: 1.8 (calc) (ref 1.0–2.5)
ALBUMIN MSPROF: 4.3 g/dL (ref 3.6–5.1)
ALT: 16 U/L (ref 6–29)
AST: 23 U/L (ref 10–35)
Alkaline phosphatase (APISO): 85 U/L (ref 33–130)
Bilirubin, Direct: 0.1 mg/dL (ref 0.0–0.2)
GLOBULIN: 2.4 g/dL (ref 1.9–3.7)
Indirect Bilirubin: 0.4 mg/dL (calc) (ref 0.2–1.2)
TOTAL PROTEIN: 6.7 g/dL (ref 6.1–8.1)
Total Bilirubin: 0.5 mg/dL (ref 0.2–1.2)

## 2017-09-02 LAB — LIPID PANEL
CHOLESTEROL: 206 mg/dL — AB (ref <200)
HDL: 75 mg/dL (ref >50)
LDL CHOLESTEROL (CALC): 111 mg/dL — AB
Non-HDL Cholesterol (Calc): 131 mg/dL (calc) — ABNORMAL HIGH (ref <130)
TRIGLYCERIDES: 101 mg/dL (ref <150)
Total CHOL/HDL Ratio: 2.7 (calc) (ref <5.0)

## 2017-09-02 LAB — MICROALBUMIN / CREATININE URINE RATIO
CREATININE, URINE: 45 mg/dL (ref 20–275)
Microalb Creat Ratio: 7 mcg/mg creat (ref <30)
Microalb, Ur: 0.3 mg/dL

## 2017-09-02 LAB — VITAMIN D 25 HYDROXY (VIT D DEFICIENCY, FRACTURES): VIT D 25 HYDROXY: 69 ng/mL (ref 30–100)

## 2017-09-02 LAB — TSH: TSH: 1.54 mIU/L (ref 0.40–4.50)

## 2017-09-02 LAB — INSULIN, RANDOM: Insulin: 4.2 u[IU]/mL (ref 2.0–19.6)

## 2017-09-02 LAB — MAGNESIUM: MAGNESIUM: 1.9 mg/dL (ref 1.5–2.5)

## 2017-09-27 ENCOUNTER — Telehealth: Payer: Self-pay | Admitting: Internal Medicine

## 2017-09-27 ENCOUNTER — Other Ambulatory Visit: Payer: Self-pay | Admitting: Internal Medicine

## 2017-09-27 ENCOUNTER — Other Ambulatory Visit: Payer: Self-pay

## 2017-09-27 DIAGNOSIS — J0410 Acute tracheitis without obstruction: Secondary | ICD-10-CM

## 2017-09-27 MED ORDER — PREDNISONE 20 MG PO TABS: ORAL_TABLET | ORAL | 0 refills | Status: DC

## 2017-09-27 NOTE — Telephone Encounter (Signed)
dizzy since last night. requesting predinose, not vomiting yet. [facility] or advise patient.

## 2017-10-09 ENCOUNTER — Other Ambulatory Visit: Payer: Self-pay

## 2017-10-09 DIAGNOSIS — Z1211 Encounter for screening for malignant neoplasm of colon: Secondary | ICD-10-CM

## 2017-10-09 LAB — POC HEMOCCULT BLD/STL (HOME/3-CARD/SCREEN): FECAL OCCULT BLD: NEGATIVE

## 2017-10-10 DIAGNOSIS — Z1211 Encounter for screening for malignant neoplasm of colon: Secondary | ICD-10-CM | POA: Diagnosis not present

## 2017-12-05 NOTE — Progress Notes (Signed)
FOLLOW UP  Assessment and Plan:   Hypertension Well controlled with current medications  Monitor blood pressure at home; patient to call if consistently greater than 130/80 Continue DASH diet.   Reminder to go to the ER if any CP, SOB, nausea, dizziness, severe HA, changes vision/speech, left arm numbness and tingling and jaw pain.  Cholesterol Currently above goal; typically well controlled with omega 3- discussed adding daily fiber Continue low cholesterol diet and exercise.  Check lipid panel.   Other abnormal glucose Recent A1Cs at goal/normalized; previously in prediabetic range Continue diet and exercise.  Perform daily foot/skin check, notify office of any concerning changes.  Defer A1C; check BMP  Overweight Long discussion about weight loss, diet, and exercise Recommended diet heavy in fruits and veggies and low in animal meats, cheeses, and dairy products, appropriate calorie intake Discussed ideal weight for height and initial weight goal  Will follow up in 3 months  Vitamin D Def At goal at last visit; continue supplementation to maintain goal of 70-100 Defer Vit D level  Continue diet and meds as discussed. Further disposition pending results of labs. Discussed med's effects and SE's.   Over 30 minutes of exam, counseling, chart review, and critical decision making was performed.   Future Appointments  Date Time Provider Department Center  03/12/2018 11:00 AM Lucky CowboyMcKeown, William, MD [facility] None  10/08/2018 10:00 AM Lucky CowboyMcKeown, William, MD [facility] None    ----------------------------------------------------------------------------------------------------------------------  HPI 72 y.o. female  presents for 3 month follow up on hypertension, cholesterol, abnormal glucose (history of prediabetes), weight and vitamin D deficiency. She also has a hx of SVT; reports she did have 1 episode around Christmas - she followed recommendations and took another atenolol and  valsalva maneuver and was able to control rate.   She has hx of insomnia - typically well controlled by lifestyle and melatonin; she is prescribed xanax 0.5 mg tabs - she takes 1/2 tab very rarely to help with sleep (estimates 2-3 times per month recently).   BMI is Body mass index is 28.53 kg/m., she has been working on diet and exercise. Wt Readings from Last 3 Encounters:  12/06/17 151 lb (68.5 kg)  08/30/17 152 lb (68.9 kg)  05/10/17 152 lb 3.2 oz (69 kg)   Her blood pressure has been controlled at home, today their BP is BP: 132/80  She does workout. She denies chest pain, shortness of breath, dizziness.   She is not on cholesterol medication (takes omega 3 supplement) and denies myalgias. Her cholesterol is not at goal. The cholesterol last visit was:   Lab Results  Component Value Date   CHOL 206 (H) 08/30/2017   HDL 75 08/30/2017   LDLCALC 123 (H) 05/10/2017   TRIG 101 08/30/2017   CHOLHDL 2.7 08/30/2017    She has been working on diet and exercise for prediabetes, and denies increased appetite, nausea, paresthesia of the feet, polydipsia, polyuria, visual disturbances and vomiting. Last A1C in the office was:  Lab Results  Component Value Date   HGBA1C 5.4 08/30/2017   Patient is on Vitamin D supplement and near goal at recent check:    Lab Results  Component Value Date   VD25OH 69 08/30/2017       Current Medications:  Current Outpatient Medications on File Prior to Visit  Medication Sig  . ALPRAZolam (XANAX) 0.5 MG tablet Take 0.5 tablets (0.25 mg total) by mouth 2 (two) times daily as needed for anxiety.  Marland Kitchen. aspirin 81 MG tablet Take  81 mg by mouth daily.  Marland Kitchen atenolol (TENORMIN) 50 MG tablet Take 1 tablet (50 mg total) by mouth daily.  . Cholecalciferol (VITAMIN D) 2000 UNITS tablet Take 4,000 Units by mouth daily.   . fexofenadine (ALLEGRA) 180 MG tablet Take 180 mg by mouth daily.  Marland Kitchen MAGNESIUM OXIDE PO Take 250 mg by mouth daily.  . Melatonin 3 MG TABS Take by  mouth.  . Multiple Vitamin (MULTIVITAMIN) tablet Take 1 tablet by mouth daily.  . multivitamin-lutein (OCUVITE-LUTEIN) CAPS capsule Take 1 capsule by mouth daily.  . Omega-3 Fatty Acids (FISH OIL) 1000 MG CAPS Take 1 capsule by mouth daily.  Marland Kitchen OVER THE COUNTER MEDICATION Takes Vitamin B Complex 1 daily  . ranitidine (ZANTAC) 75 MG tablet Take 75 mg by mouth 2 (two) times daily.   No current facility-administered medications on file prior to visit.      Allergies:  Allergies  Allergen Reactions  . Penicillins Anaphylaxis  . Codeine Nausea And Vomiting  . Sudafed [Pseudoephedrine Hcl]     Makes heart race, per pt      Medical History:  Past Medical History:  Diagnosis Date  . Essential hypertension 10/20/2013  . 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 GERD 10/20/2013  . Hyperlipidemia 10/20/2013  . Migraine, unspecified, without mention of intractable migraine without mention of status migrainosus 10/20/2013  . Morbid obesity (HCC) 11/18/2014  . Prediabetes 10/20/2013  . SVT (supraventricular tachycardia) (HCC) 11/15/2015  . Vitamin D deficiency 10/20/2013   Family history- Reviewed and unchanged Social history- Reviewed and unchanged   Review of Systems:  Review of Systems  Constitutional: Negative for malaise/fatigue and weight loss.  HENT: Negative for hearing loss and tinnitus.   Eyes: Negative for blurred vision and double vision.  Respiratory: Negative for cough, shortness of breath and wheezing.   Cardiovascular: Negative for chest pain, palpitations, orthopnea, claudication and leg swelling.  Gastrointestinal: Negative for abdominal pain, blood in stool, constipation, diarrhea, heartburn, melena, nausea and vomiting.  Genitourinary: Negative.   Musculoskeletal: Negative for joint pain and myalgias.  Skin: Negative for rash.  Neurological: Negative for dizziness, tingling, sensory change, weakness and headaches.  Endo/Heme/Allergies: Negative for polydipsia.   Psychiatric/Behavioral: Negative.   All other systems reviewed and are negative.     Physical Exam: BP 132/80   Pulse (!) 57   Temp 97.7 F (36.5 C)   Ht 5\' 1"  (1.549 m)   Wt 151 lb (68.5 kg)   SpO2 98%   BMI 28.53 kg/m  Wt Readings from Last 3 Encounters:  12/06/17 151 lb (68.5 kg)  08/30/17 152 lb (68.9 kg)  05/10/17 152 lb 3.2 oz (69 kg)   General Appearance: Well nourished, in no apparent distress. Eyes: PERRLA, EOMs, conjunctiva no swelling or erythema Sinuses: No Frontal/maxillary tenderness ENT/Mouth: Ext aud canals clear, TMs without erythema, bulging. No erythema, swelling, or exudate on post pharynx.  Tonsils not swollen or erythematous. Hearing normal.  Neck: Supple, thyroid normal.  Respiratory: Respiratory effort normal, BS equal bilaterally without rales, rhonchi, wheezing or stridor.  Cardio: RRR with no MRGs. Brisk peripheral pulses without edema.  Abdomen: Soft, + BS.  Non tender, no guarding, rebound, hernias, masses. Lymphatics: Non tender without lymphadenopathy.  Musculoskeletal: Full ROM, 5/5 strength, Normal gait Skin: Warm, dry without rashes, lesions, ecchymosis.  Neuro: Cranial nerves intact. No cerebellar symptoms.  Psych: Awake and oriented X 3, normal affect, Insight and Judgment appropriate.    Dan Maker, NP 10:38 AM [facility]

## 2017-12-06 ENCOUNTER — Ambulatory Visit: Payer: Medicare Other | Admitting: Adult Health

## 2017-12-06 ENCOUNTER — Encounter: Payer: Self-pay | Admitting: Adult Health

## 2017-12-06 VITALS — BP 132/80 | HR 57 | Temp 97.7°F | Ht 61.0 in | Wt 151.0 lb

## 2017-12-06 DIAGNOSIS — Z79899 Other long term (current) drug therapy: Secondary | ICD-10-CM

## 2017-12-06 DIAGNOSIS — I10 Essential (primary) hypertension: Secondary | ICD-10-CM | POA: Diagnosis not present

## 2017-12-06 DIAGNOSIS — E559 Vitamin D deficiency, unspecified: Secondary | ICD-10-CM | POA: Diagnosis not present

## 2017-12-06 DIAGNOSIS — G4700 Insomnia, unspecified: Secondary | ICD-10-CM | POA: Diagnosis not present

## 2017-12-06 DIAGNOSIS — E663 Overweight: Secondary | ICD-10-CM | POA: Diagnosis not present

## 2017-12-06 DIAGNOSIS — E782 Mixed hyperlipidemia: Secondary | ICD-10-CM | POA: Diagnosis not present

## 2017-12-06 DIAGNOSIS — R7309 Other abnormal glucose: Secondary | ICD-10-CM

## 2017-12-06 LAB — HEPATIC FUNCTION PANEL
AG Ratio: 1.7 (calc) (ref 1.0–2.5)
ALBUMIN MSPROF: 4.5 g/dL (ref 3.6–5.1)
ALT: 22 U/L (ref 6–29)
AST: 27 U/L (ref 10–35)
Alkaline phosphatase (APISO): 90 U/L (ref 33–130)
BILIRUBIN TOTAL: 0.4 mg/dL (ref 0.2–1.2)
Bilirubin, Direct: 0.1 mg/dL (ref 0.0–0.2)
GLOBULIN: 2.7 g/dL (ref 1.9–3.7)
Indirect Bilirubin: 0.3 mg/dL (calc) (ref 0.2–1.2)
Total Protein: 7.2 g/dL (ref 6.1–8.1)

## 2017-12-06 LAB — CBC WITH DIFFERENTIAL/PLATELET
BASOS PCT: 1 %
Basophils Absolute: 68 cells/uL (ref 0–200)
EOS PCT: 3.4 %
Eosinophils Absolute: 231 cells/uL (ref 15–500)
HEMATOCRIT: 42.3 % (ref 35.0–45.0)
HEMOGLOBIN: 14.2 g/dL (ref 11.7–15.5)
Lymphs Abs: 1068 cells/uL (ref 850–3900)
MCH: 28.8 pg (ref 27.0–33.0)
MCHC: 33.6 g/dL (ref 32.0–36.0)
MCV: 85.8 fL (ref 80.0–100.0)
MPV: 9.7 fL (ref 7.5–12.5)
Monocytes Relative: 9.1 %
NEUTROS ABS: 4814 {cells}/uL (ref 1500–7800)
Neutrophils Relative %: 70.8 %
Platelets: 318 10*3/uL (ref 140–400)
RBC: 4.93 10*6/uL (ref 3.80–5.10)
RDW: 12.3 % (ref 11.0–15.0)
Total Lymphocyte: 15.7 %
WBC: 6.8 10*3/uL (ref 3.8–10.8)
WBCMIX: 619 {cells}/uL (ref 200–950)

## 2017-12-06 LAB — LIPID PANEL
CHOL/HDL RATIO: 3 (calc) (ref <5.0)
CHOLESTEROL: 228 mg/dL — AB (ref <200)
HDL: 75 mg/dL (ref >50)
LDL CHOLESTEROL (CALC): 134 mg/dL — AB
Non-HDL Cholesterol (Calc): 153 mg/dL (calc) — ABNORMAL HIGH (ref <130)
TRIGLYCERIDES: 87 mg/dL (ref <150)

## 2017-12-06 LAB — BASIC METABOLIC PANEL WITH GFR
BUN/Creatinine Ratio: 13 (calc) (ref 6–22)
BUN: 13 mg/dL (ref 7–25)
CALCIUM: 10.1 mg/dL (ref 8.6–10.4)
CHLORIDE: 106 mmol/L (ref 98–110)
CO2: 25 mmol/L (ref 20–32)
Creat: 0.99 mg/dL — ABNORMAL HIGH (ref 0.60–0.93)
GFR, EST AFRICAN AMERICAN: 66 mL/min/{1.73_m2} (ref >=60)
GFR, EST NON AFRICAN AMERICAN: 57 mL/min/{1.73_m2} — AB (ref >=60)
Glucose, Bld: 99 mg/dL (ref 65–99)
Potassium: 4.8 mmol/L (ref 3.5–5.3)
Sodium: 142 mmol/L (ref 135–146)

## 2017-12-06 LAB — TSH: TSH: 1.21 mIU/L (ref 0.40–4.50)

## 2017-12-06 NOTE — Patient Instructions (Signed)

## 2017-12-12 ENCOUNTER — Encounter: Payer: Self-pay | Admitting: Adult Health

## 2017-12-23 ENCOUNTER — Encounter (INDEPENDENT_AMBULATORY_CARE_PROVIDER_SITE_OTHER): Payer: Self-pay

## 2018-01-03 ENCOUNTER — Other Ambulatory Visit: Payer: Self-pay | Admitting: Physician Assistant

## 2018-02-10 ENCOUNTER — Other Ambulatory Visit: Payer: Self-pay | Admitting: Internal Medicine

## 2018-03-12 ENCOUNTER — Ambulatory Visit: Payer: Medicare Other | Admitting: Internal Medicine

## 2018-03-12 VITALS — BP 136/76 | HR 64 | Temp 97.2°F | Resp 16 | Ht 61.0 in | Wt 148.4 lb

## 2018-03-12 DIAGNOSIS — R7303 Prediabetes: Secondary | ICD-10-CM | POA: Diagnosis not present

## 2018-03-12 DIAGNOSIS — E782 Mixed hyperlipidemia: Secondary | ICD-10-CM

## 2018-03-12 DIAGNOSIS — I10 Essential (primary) hypertension: Secondary | ICD-10-CM | POA: Diagnosis not present

## 2018-03-12 DIAGNOSIS — R7309 Other abnormal glucose: Secondary | ICD-10-CM

## 2018-03-12 DIAGNOSIS — K2100 Gastro-esophageal reflux disease with esophagitis, without bleeding: Secondary | ICD-10-CM

## 2018-03-12 DIAGNOSIS — Z79899 Other long term (current) drug therapy: Secondary | ICD-10-CM | POA: Diagnosis not present

## 2018-03-12 DIAGNOSIS — E559 Vitamin D deficiency, unspecified: Secondary | ICD-10-CM

## 2018-03-12 LAB — CBC WITH DIFFERENTIAL/PLATELET
BASOS ABS: 58 {cells}/uL (ref 0–200)
Basophils Relative: 1 %
Eosinophils Absolute: 151 cells/uL (ref 15–500)
Eosinophils Relative: 2.6 %
HEMATOCRIT: 41.8 % (ref 35.0–45.0)
HEMOGLOBIN: 14.1 g/dL (ref 11.7–15.5)
LYMPHS ABS: 974 {cells}/uL (ref 850–3900)
MCH: 29.3 pg (ref 27.0–33.0)
MCHC: 33.7 g/dL (ref 32.0–36.0)
MCV: 86.9 fL (ref 80.0–100.0)
MPV: 9.4 fL (ref 7.5–12.5)
Monocytes Relative: 10.2 %
NEUTROS ABS: 4025 {cells}/uL (ref 1500–7800)
Neutrophils Relative %: 69.4 %
Platelets: 313 10*3/uL (ref 140–400)
RBC: 4.81 10*6/uL (ref 3.80–5.10)
RDW: 12.7 % (ref 11.0–15.0)
Total Lymphocyte: 16.8 %
WBC mixed population: 592 cells/uL (ref 200–950)
WBC: 5.8 10*3/uL (ref 3.8–10.8)

## 2018-03-12 LAB — TSH: TSH: 1.35 m[IU]/L (ref 0.40–4.50)

## 2018-03-12 NOTE — Progress Notes (Signed)
This very nice 72 y.o. MWF presents for 6 month follow up with HTN, HLD, Pre-Diabetes and Vitamin D Deficiency.      Patient is treated for HTN & BP has been controlled at home. Today's BP was initially elevated and rechecked at goal - 136/76. Patient has had no complaints of any cardiac type chest pain, palpitations, dyspnea / orthopnea / PND, dizziness, claudication, or dependent edema.     Hyperlipidemia is not ontrolled with diet. Last Lipids were not at goal: Lab Results  Component Value Date   CHOL 209 (H) 03/12/2018   HDL 73 03/12/2018   LDLCALC 119 (H) 03/12/2018   TRIG 73 03/12/2018   CHOLHDL 2.9 03/12/2018      Also, the patient is proactively monitored for PreDiabetes and has had no symptoms of reactive hypoglycemia, diabetic polys, paresthesias or visual blurring.  Last A1c was Normal & at goal: Lab Results  Component Value Date   HGBA1C 5.6 03/12/2018      Further, the patient also has history of Vitamin D Deficiency ("9"/2008)  and supplements vitamin D without any suspected side-effects. Last vitamin D was at goal: Lab Results  Component Value Date   VD25OH 76 03/12/2018   Current Outpatient Medications on File Prior to Visit  Medication Sig  . ALPRAZolam (XANAX) 0.5 MG tablet TAKE ONE TABLET BY MOUTH TWICE DAILY AS NEEDED FOR ANXIETY  . aspirin 81 MG tablet Take 81 mg by mouth daily.  Marland Kitchen atenolol (TENORMIN) 50 MG tablet TAKE ONE TABLET BY MOUTH EVERY DAY  . Cholecalciferol (VITAMIN D) 2000 UNITS tablet Take 4,000 Units by mouth daily.   . fexofenadine (ALLEGRA) 180 MG tablet Take 180 mg by mouth daily.  Marland Kitchen MAGNESIUM OXIDE PO Take 250 mg by mouth daily.  . Melatonin 3 MG TABS Take by mouth.  . Multiple Vitamin (MULTIVITAMIN) tablet Take 1 tablet by mouth daily.  . multivitamin-lutein (OCUVITE-LUTEIN) CAPS capsule Take 1 capsule by mouth daily.  . Omega-3 Fatty Acids (FISH OIL) 1000 MG CAPS Take 1 capsule by mouth daily.  Marland Kitchen OVER THE COUNTER MEDICATION Takes  Vitamin B Complex 1 daily  . ranitidine (ZANTAC) 75 MG tablet Take 75 mg by mouth 2 (two) times daily.   No current facility-administered medications on file prior to visit.    Allergies  Allergen Reactions  . Penicillins Anaphylaxis  . Codeine Nausea And Vomiting  . Sudafed [Pseudoephedrine Hcl]     Makes heart race, per pt    PMHx:   Past Medical History:  Diagnosis Date  . Essential hypertension 10/20/2013  . 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 GERD 10/20/2013  . Hyperlipidemia 10/20/2013  . Migraine, unspecified, without mention of intractable migraine without mention of status migrainosus 10/20/2013  . Morbid obesity (HCC) 11/18/2014  . Prediabetes 10/20/2013  . SVT (supraventricular tachycardia) (HCC) 11/15/2015  . Vitamin D deficiency 10/20/2013   Immunization History  Administered Date(s) Administered  . DTaP 09/12/2013  . Influenza Split 09/12/2013  . Influenza, High Dose Seasonal PF 10/04/2014, 08/30/2017  . Pneumococcal Conjugate-13 10/04/2014  . Pneumococcal Polysaccharide-23 03/29/2009  . Td 12/20/2003  . Tdap 09/12/2013  . Zoster 06/07/2010   Past Surgical History:  Procedure Laterality Date  . NO PAST SURGERIES     FHx:    Reviewed / unchanged  SHx:    Reviewed / unchanged   Systems Review:  Constitutional: Denies fever, chills, wt changes, headaches, insomnia, fatigue, night sweats, change in appetite. Eyes: Denies redness, blurred vision,  diplopia, discharge, itchy, watery eyes.  ENT: Denies discharge, congestion, post nasal drip, epistaxis, sore throat, earache, hearing loss, dental pain, tinnitus, vertigo, sinus pain, snoring.  CV: Denies chest pain, palpitations, irregular heartbeat, syncope, dyspnea, diaphoresis, orthopnea, PND, claudication or edema. Respiratory: denies cough, dyspnea, DOE, pleurisy, hoarseness, laryngitis, wheezing.  Gastrointestinal: Denies dysphagia, odynophagia, heartburn, reflux, water brash, abdominal pain or cramps, nausea, vomiting,  bloating, diarrhea, constipation, hematemesis, melena, hematochezia  or hemorrhoids. Genitourinary: Denies dysuria, frequency, urgency, nocturia, hesitancy, discharge, hematuria or flank pain. Musculoskeletal: Denies arthralgias, myalgias, stiffness, jt. swelling, pain, limping or strain/sprain.  Skin: Denies pruritus, rash, hives, warts, acne, eczema or change in skin lesion(s). Neuro: No weakness, tremor, incoordination, spasms, paresthesia or pain. Psychiatric: Denies confusion, memory loss or sensory loss. Endo: Denies change in weight, skin or hair change.  Heme/Lymph: No excessive bleeding, bruising or enlarged lymph nodes.  Physical Exam  BP 136/76   Pulse 64   Temp (!) 97.2 F (36.2 C)   Resp 16   Ht  (1.549 m)   Wt 148 lb 6.4 oz (67.3 kg)   BMI 28.04 kg/m   Appears  well nourished, well groomed  and in no distress.  Eyes: PERRLA, EOMs, conjunctiva no swelling or erythema. Sinuses: No frontal/maxillary tenderness ENT/Mouth: EAC's clear, TM's nl w/o erythema, bulging. Nares clear w/o erythema, swelling, exudates. Oropharynx clear without erythema or exudates. Oral hygiene is good. Tongue normal, non obstructing. Hearing intact.  Neck: Supple. Thyroid not palpable. Car 2+/2+ without bruits, nodes or JVD. Chest: Respirations nl with BS clear & equal w/o rales, rhonchi, wheezing or stridor.  Cor: Heart sounds normal w/ regular rate and rhythm without sig. murmurs, gallops, clicks or rubs. Peripheral pulses normal and equal  without edema.  Abdomen: Soft & bowel sounds normal. Non-tender w/o guarding, rebound, hernias, masses or organomegaly.  Lymphatics: Unremarkable.  Musculoskeletal: Full ROM all peripheral extremities, joint stability, 5/5 strength and normal gait.  Skin: Warm, dry without exposed rashes, lesions or ecchymosis apparent.  Neuro: Cranial nerves intact, reflexes equal bilaterally. Sensory-motor testing grossly intact. Tendon reflexes grossly intact.  Pysch:  Alert & oriented x 3.  Insight and judgement nl & appropriate. No ideations.  Assessment and Plan:  1. Essential hypertension  - Continue medication, monitor blood pressure at home.  - Continue DASH diet.  Reminder to go to the ER if any CP,  SOB, nausea, dizziness, severe HA, changes vision/speech.  - CBC with Differential/Platelet - COMPLETE METABOLIC PANEL WITH GFR - Magnesium - TSH  2. Hyperlipidemia, mixed  - Continue diet/meds, exercise,& lifestyle modifications.  - Continue monitor periodic cholesterol/liver & renal functions   - Lipid panel - TSH  3. Abnormal glucose  - Continue diet, exercise, lifestyle modifications.  - Monitor appropriate labs.  - Hemoglobin A1c  - Insulin, random  4. Vitamin D deficiency  - Continue supplementation.  - VITAMIN D 25 Hydroxyl  5. Prediabetes  - Hemoglobin A1c - Insulin, random  6. GERD  - CBC with Differential/Platelet  7. Medication management  - CBC with Differential/Platelet - COMPLETE METABOLIC PANEL WITH GFR - Magnesium - Lipid panel - TSH - Hemoglobin A1c - Insulin, random - VITAMIN D 25 Hydroxyl       Discussed  regular exercise, BP monitoring, weight control to achieve/maintain BMI less than 25 and discussed med and SE's. Recommended labs to assess and monitor clinical status with further disposition pending results of labs. Over 30 minutes of exam, counseling, chart review was performed.

## 2018-03-12 NOTE — Patient Instructions (Signed)

## 2018-03-13 LAB — LIPID PANEL
CHOL/HDL RATIO: 2.9 (calc) (ref <5.0)
CHOLESTEROL: 209 mg/dL — AB (ref <200)
HDL: 73 mg/dL (ref >50)
LDL Cholesterol (Calc): 119 mg/dL (calc) — ABNORMAL HIGH
Non-HDL Cholesterol (Calc): 136 mg/dL (calc) — ABNORMAL HIGH (ref <130)
Triglycerides: 73 mg/dL (ref <150)

## 2018-03-13 LAB — COMPLETE METABOLIC PANEL WITH GFR
AG RATIO: 1.7 (calc) (ref 1.0–2.5)
ALKALINE PHOSPHATASE (APISO): 87 U/L (ref 33–130)
ALT: 20 U/L (ref 6–29)
AST: 26 U/L (ref 10–35)
Albumin: 4.3 g/dL (ref 3.6–5.1)
BILIRUBIN TOTAL: 0.4 mg/dL (ref 0.2–1.2)
BUN: 14 mg/dL (ref 7–25)
CO2: 26 mmol/L (ref 20–32)
Calcium: 9.6 mg/dL (ref 8.6–10.4)
Chloride: 106 mmol/L (ref 98–110)
Creat: 0.92 mg/dL (ref 0.60–0.93)
GFR, Est African American: 73 mL/min/{1.73_m2} (ref >=60)
GFR, Est Non African American: 63 mL/min/{1.73_m2} (ref >=60)
GLUCOSE: 95 mg/dL (ref 65–99)
Globulin: 2.5 g/dL (calc) (ref 1.9–3.7)
POTASSIUM: 4.4 mmol/L (ref 3.5–5.3)
Sodium: 142 mmol/L (ref 135–146)
Total Protein: 6.8 g/dL (ref 6.1–8.1)

## 2018-03-13 LAB — HEMOGLOBIN A1C
EAG (MMOL/L): 6.3 (calc)
Hgb A1c MFr Bld: 5.6 % of total Hgb (ref <5.7)
Mean Plasma Glucose: 114 (calc)

## 2018-03-13 LAB — INSULIN, RANDOM: INSULIN: 4.6 u[IU]/mL (ref 2.0–19.6)

## 2018-03-13 LAB — MAGNESIUM: MAGNESIUM: 1.9 mg/dL (ref 1.5–2.5)

## 2018-03-13 LAB — VITAMIN D 25 HYDROXY (VIT D DEFICIENCY, FRACTURES): VIT D 25 HYDROXY: 76 ng/mL (ref 30–100)

## 2018-03-15 ENCOUNTER — Encounter: Payer: Self-pay | Admitting: Internal Medicine

## 2018-06-17 NOTE — Progress Notes (Signed)
MEDICARE ANNUAL WELLNESS VISIT AND FOLLOW UP  Assessment:    Encounter for Medicare annual wellness exam - referring back to GI for colonoscopy  Essential hypertension - continue medications, DASH diet, exercise and monitor at home. Call if greater than 130/80.  -     CBC with Differential/Platelet -     CMP/GFR -     TSH  Migraine without status migrainosus, not intractable, unspecified migraine type Avoid triggers  SVT (supraventricular tachycardia) (HCC) Continue cardio follow up No symptoms at this time  GERD Continue PPI/H2 blocker, diet discussed  Hyperlipidemia -continue medications, check lipids, decrease fatty foods, increase activity.  -     Lipid panel  Prediabetes Discussed general issues about diabetes pathophysiology and management., Educational material distributed., Suggested low cholesterol diet., Encouraged aerobic exercise., Discussed foot care., Reminded to get yearly retinal exam.  Vitamin D deficiency At goal at recent check; continue to recommend supplementation for goal of 70-100 Defer vitamin D level  Facial neuralgia Continue follow up /medications  Medication management  Overweight - long discussion about weight loss, diet, and exercise  Insomnia/anxiety Well managed by current regimen; continue medications Stress management techniques discussed, increase water, good sleep hygiene discussed, increase exercise, and increase veggies.   Family history of CAD Female family members with MI and CAD requiring stents Has never had stress testing, will refer back to cardiology to discuss options  Over 30 minutes of exam, counseling, chart review, and critical decision making was performed  Future Appointments  Date Time Provider Department Center  10/08/2018 10:00 AM Lucky Cowboy, MD [facility] None     Plan:   During the course of the visit the patient was educated and counseled about appropriate screening and preventive services  including:    Pneumococcal vaccine   Influenza vaccine  Td vaccine  Prevnar 13  Screening electrocardiogram  Screening mammography  Bone densitometry screening  Colorectal cancer screening  Diabetes screening  Glaucoma screening  Nutrition counseling   Advanced directives: given info/requested copies   Subjective:   Jean Drake is a 72 y.o. female who presents for Medicare Annual Wellness Visit and 3 month follow up on hypertension, prediabetes, hyperlipidemia, vitamin D def.   She is prescribed xanax 0.5 mg BID PRN anxiety/insomnia; uses 1/2 tab at needed in the evening, usually needs 0-3 per week.   BMI is Body mass index is 28.15 kg/m., she has been working on diet and exercise, though limited Wt Readings from Last 3 Encounters:  06/18/18 149 lb (67.6 kg)  03/12/18 148 lb 6.4 oz (67.3 kg)  12/06/17 151 lb (68.5 kg)   Her blood pressure has been controlled at home, today their BP is BP: 120/60 She does not workout. She denies chest pain, shortness of breath, dizziness.  She notes that she still has the occasional palpitation but she takes an extra atenolol and aspirin.    She is not on cholesterol medication (omega 3 supplement only). Her cholesterol is not at goal. The cholesterol last visit was:   Lab Results  Component Value Date   CHOL 209 (H) 03/12/2018   HDL 73 03/12/2018   LDLCALC 119 (H) 03/12/2018   TRIG 73 03/12/2018   CHOLHDL 2.9 03/12/2018    Last A1C in the office was:  Lab Results  Component Value Date   HGBA1C 5.6 03/12/2018   Last GFR Lab Results  Component Value Date   GFRNONAA 63 03/12/2018   Patient is on Vitamin D supplement. Lab Results  Component Value  Date   VD25OH 76 03/12/2018      Medication Review Current Outpatient Medications on File Prior to Visit  Medication Sig Dispense Refill  . ALPRAZolam (XANAX) 0.5 MG tablet TAKE ONE TABLET BY MOUTH TWICE DAILY AS NEEDED FOR ANXIETY 60 tablet 0  . aspirin 81 MG tablet Take  81 mg by mouth daily.    Marland Kitchen. atenolol (TENORMIN) 50 MG tablet TAKE ONE TABLET BY MOUTH EVERY DAY 90 tablet 1  . Cholecalciferol (VITAMIN D) 2000 UNITS tablet Take 4,000 Units by mouth daily.     . fexofenadine (ALLEGRA) 180 MG tablet Take 180 mg by mouth daily.    Marland Kitchen. MAGNESIUM OXIDE PO Take 250 mg by mouth daily.    . Melatonin 3 MG TABS Take by mouth.    . Multiple Vitamin (MULTIVITAMIN) tablet Take 1 tablet by mouth daily.    . multivitamin-lutein (OCUVITE-LUTEIN) CAPS capsule Take 1 capsule by mouth daily.    . Omega-3 Fatty Acids (FISH OIL) 1000 MG CAPS Take 1 capsule by mouth daily.    Marland Kitchen. OVER THE COUNTER MEDICATION Takes Vitamin B Complex 1 daily    . ranitidine (ZANTAC) 75 MG tablet Take 75 mg by mouth 2 (two) times daily.     No current facility-administered medications on file prior to visit.     Current Problems (verified) Patient Active Problem List   Diagnosis Date Noted  . Insomnia 12/06/2017  . SVT (supraventricular tachycardia) (HCC) 11/15/2015  . Overweight (BMI 25.0-29.9) 11/18/2014  . Medication management 04/20/2014  . Essential hypertension 10/20/2013  . Hyperlipidemia, mixed 10/20/2013  . Other abnormal glucose 10/20/2013  . Vitamin D deficiency 10/20/2013  . GERD 10/20/2013  . Migraine headache 10/20/2013  . 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 DTaP 09/12/2013  . Influenza Split 09/12/2013  . Influenza, High Dose Seasonal PF 10/04/2014, 08/30/2017  . Pneumococcal Conjugate-13 10/04/2014  . Pneumococcal Polysaccharide-23 03/29/2009  . Td 12/20/2003  . Tdap 09/12/2013  . Zoster 06/07/2010    Preventative care: Last colonoscopy: 2008 due has been 10 years, will refer back Last mammogram: 08/2017 DEXA: 07/2016 T -1.1 defer  Echo 12/2015  Prior vaccinations: TD or Tdap: 2014  Influenza: 2018  Pneumococcal: 2010 Prevnar13: 2015 Shingles/Zostavax: 2011  Names of Other  Physician/Practitioners you currently use: 1. [facility]- here for primary care 2. Dr. Dione BoozeGroat , eye doctor, last visit 2018 3. Dr. Irving CopasFinn, dentist, last visit 2019, goes q323m  Patient Care Team: Lucky CowboyMcKeown, William, MD as PCP - General (Internal Medicine) Louis MeckelKaplan, Robert D, MD (Inactive) as Consulting Physician (Gastroenterology) Tracey HarriesHenley, Thomas, MD as Consulting Physician (Obstetrics and Gynecology)  Allergies Allergies  Allergen Reactions  . Penicillins Anaphylaxis  . Codeine Nausea And Vomiting  . Sudafed [Pseudoephedrine Hcl]     Makes heart race, per pt     SURGICAL HISTORY She  has a past surgical history that includes No past surgeries. FAMILY HISTORY Her family history includes Breast cancer in her mother; Heart disease in her mother; Kidney failure in her paternal uncle; Polycystic kidney disease in her paternal uncle. SOCIAL HISTORY She  reports that she has never smoked. She has never used smokeless tobacco. She reports that she does not drink alcohol.  MEDICARE WELLNESS OBJECTIVES: Physical activity: Current Exercise Habits: Home exercise routine, Time (Minutes): 30, Frequency (Times/Week): 2, Weekly Exercise (Minutes/Week): 60, Intensity: Mild, Exercise limited by: None identified Cardiac risk factors: Cardiac Risk Factors include: advanced  age (>58men, >57 women);hypertension;dyslipidemia Depression/mood screen:   Depression screen [facility] 2/9 06/18/2018  Decreased Interest 0  Down, Depressed, Hopeless 0  PHQ - 2 Score 0    ADLs:  In your present state of health, do you have any difficulty performing the following activities: 06/18/2018 03/15/2018  Hearing? N N  Vision? N N  Difficulty concentrating or making decisions? N N  Walking or climbing stairs? N N  Dressing or bathing? N N  Doing errands, shopping? N -  Some recent data might be hidden    Cognitive Testing  Alert? Yes  Normal Appearance?Yes  Oriented to person? Yes  Place?  Yes   Time? Yes  Recall of three objects?  Yes  Can perform simple calculations? Yes  Displays appropriate judgment?Yes  Can read the correct time from a watch face?Yes  EOL planning: Does Patient Have a Medical Advance Directive?: Yes Type of Advance Directive: Healthcare Power of Attorney, Living will Does patient want to make changes to medical advance directive?: No - Patient declined Copy of Healthcare Power of Attorney in Chart?: No - copy requested   Objective:   Today's Vitals   06/18/18 1358  BP: 120/60  Pulse: 66  Temp: (!) 97.5 F (36.4 C)  SpO2: 99%  Weight: 149 lb (67.6 kg)  Height: 5\' 1"  (1.549 m)   Body mass index is 28.15 kg/m.  General appearance: alert, no distress, WD/WN,  female HEENT: normocephalic, sclerae anicteric, TMs pearly, nares patent, no discharge or erythema, pharynx normal Oral cavity: MMM, no lesions Neck: supple, no lymphadenopathy, no thyromegaly, no masses Heart: RRR, normal S1, S2, no murmurs Lungs: CTA bilaterally, no wheezes, rhonchi, or rales Abdomen: +bs, soft, non tender, + distended/full lower AB, no masses, no hepatomegaly, no splenomegaly Musculoskeletal: nontender, no swelling, no obvious deformity Extremities: no edema, no cyanosis, no clubbing Pulses: 2+ symmetric, upper and lower extremities, normal cap refill Neurological: alert, oriented x 3, CN2-12 intact, strength normal upper extremities and lower extremities, sensation normal throughout, DTRs 2+ throughout, no cerebellar signs, gait normal Psychiatric: normal affect, behavior normal, pleasant  Breast: defer Gyn: defer Rectal: defer   Medicare Attestation I have personally reviewed: The patient's medical and social history Their use of alcohol, tobacco or illicit drugs Their current medications and supplements The patient's functional ability including ADLs,fall risks, home safety risks, cognitive, and hearing and visual impairment Diet and physical  activities Evidence for depression or mood disorders  The patient's weight, height, BMI, and visual acuity have been recorded in the chart.  I have made referrals, counseling, and provided education to the patient based on review of the above and I have provided the patient with a written personalized care plan for preventive services.     Dan Maker, NP   06/18/2018

## 2018-06-18 ENCOUNTER — Ambulatory Visit: Payer: Medicare Other | Admitting: Adult Health

## 2018-06-18 ENCOUNTER — Encounter: Payer: Self-pay | Admitting: Adult Health

## 2018-06-18 VITALS — BP 120/60 | HR 66 | Temp 97.5°F | Ht 61.0 in | Wt 149.0 lb

## 2018-06-18 DIAGNOSIS — Z8249 Family history of ischemic heart disease and other diseases of the circulatory system: Secondary | ICD-10-CM | POA: Insufficient documentation

## 2018-06-18 DIAGNOSIS — K2100 Gastro-esophageal reflux disease with esophagitis, without bleeding: Secondary | ICD-10-CM

## 2018-06-18 DIAGNOSIS — I10 Essential (primary) hypertension: Secondary | ICD-10-CM | POA: Diagnosis not present

## 2018-06-18 DIAGNOSIS — G43909 Migraine, unspecified, not intractable, without status migrainosus: Secondary | ICD-10-CM | POA: Diagnosis not present

## 2018-06-18 DIAGNOSIS — G4700 Insomnia, unspecified: Secondary | ICD-10-CM

## 2018-06-18 DIAGNOSIS — Z79899 Other long term (current) drug therapy: Secondary | ICD-10-CM

## 2018-06-18 DIAGNOSIS — I471 Supraventricular tachycardia: Secondary | ICD-10-CM

## 2018-06-18 DIAGNOSIS — Z0000 Encounter for general adult medical examination without abnormal findings: Secondary | ICD-10-CM

## 2018-06-18 DIAGNOSIS — E782 Mixed hyperlipidemia: Secondary | ICD-10-CM

## 2018-06-18 DIAGNOSIS — R7309 Other abnormal glucose: Secondary | ICD-10-CM | POA: Diagnosis not present

## 2018-06-18 DIAGNOSIS — E663 Overweight: Secondary | ICD-10-CM

## 2018-06-18 DIAGNOSIS — G518 Other disorders of facial nerve: Secondary | ICD-10-CM

## 2018-06-18 DIAGNOSIS — Z1211 Encounter for screening for malignant neoplasm of colon: Secondary | ICD-10-CM

## 2018-06-18 DIAGNOSIS — E559 Vitamin D deficiency, unspecified: Secondary | ICD-10-CM

## 2018-06-18 NOTE — Patient Instructions (Signed)
Know what a healthy weight is for you (roughly BMI <25) and aim to maintain this  Aim for 7+ servings of fruits and vegetables daily  65-80+ fluid ounces of water or unsweet tea for healthy kidneys  Limit to max 1 drink of alcohol per day; avoid smoking/tobacco  Limit animal fats in diet for cholesterol and heart health - choose grass fed whenever available  Avoid highly processed foods, and foods high in saturated/trans fats  Aim for low stress - take time to unwind and care for your mental health  Aim for 150 min of moderate intensity exercise weekly for heart health, and weights twice weekly for bone health  Aim for 7-9 hours of sleep daily     Drink 1/2 your body weight in fluid ounces of water daily; drink a tall glass of water 30 min before meals  Don't eat until you're stuffed- listen to your stomach and eat until you are 80% full   Try eating off of a salad plate; wait 10 min after finishing before going back for seconds  Start by eating the vegetables on your plate; aim for 50% of your meals to be fruits or vegetables  Then eat your protein - lean meats (grass fed if possible), fish, beans, nuts in moderation  Eat your carbs/starch last ONLY if you still are hungry. If you can, stop before finishing it all  Avoid sugar and flour - the closer it looks to it's original form in nature, typically the better it is for you  Splurge in moderation - "assign" days when you get to splurge and have the "bad stuff" - I like to follow a 80% - 20% plan- "good" choices 80 % of the time, "bad" choices in moderation 20% of the time  Simple equation is: Calories out > calories in = weight loss - even if you eat the bad stuff, if you limit portions, you will still lose weight       When it comes to diets, agreement about the perfect plan isn't easy to find, even among the experts. Experts at the [employer] developed an idea known as the Healthy Eating Plate.  Just imagine a plate divided into logical, healthy portions.  The emphasis is on diet quality:  Load up on vegetables and fruits - one-half of your plate: Aim for color and variety, and remember that potatoes don't count.  Go for whole grains - one-quarter of your plate: Whole wheat, barley, wheat berries, quinoa, oats, brown rice, and foods made with them. If you want pasta, go with whole wheat pasta.  Protein power - one-quarter of your plate: Fish, chicken, beans, and nuts are all healthy, versatile protein sources. Limit red meat.  The diet, however, does go beyond the plate, offering a few other suggestions.  Use healthy plant oils, such as olive, canola, soy, corn, sunflower and peanut. Check the labels, and avoid partially hydrogenated oil, which have unhealthy trans fats.  If you're thirsty, drink water. Coffee and tea are good in moderation, but skip sugary drinks and limit milk and dairy products to one or two daily servings.  The type of carbohydrate in the diet is more important than the amount. Some sources of carbohydrates, such as vegetables, fruits, whole grains, and beans-are healthier than others.  Finally, stay active.  

## 2018-06-19 ENCOUNTER — Other Ambulatory Visit: Payer: Self-pay | Admitting: Adult Health

## 2018-06-19 DIAGNOSIS — N183 Chronic kidney disease, stage 3 unspecified: Secondary | ICD-10-CM

## 2018-06-19 LAB — CBC WITH DIFFERENTIAL/PLATELET
BASOS PCT: 0.8 %
Basophils Absolute: 50 cells/uL (ref 0–200)
Eosinophils Absolute: 120 cells/uL (ref 15–500)
Eosinophils Relative: 1.9 %
HCT: 41.3 % (ref 35.0–45.0)
Hemoglobin: 13.9 g/dL (ref 11.7–15.5)
Lymphs Abs: 863 cells/uL (ref 850–3900)
MCH: 29.6 pg (ref 27.0–33.0)
MCHC: 33.7 g/dL (ref 32.0–36.0)
MCV: 87.9 fL (ref 80.0–100.0)
MONOS PCT: 8.6 %
MPV: 9.6 fL (ref 7.5–12.5)
Neutro Abs: 4725 cells/uL (ref 1500–7800)
Neutrophils Relative %: 75 %
PLATELETS: 304 10*3/uL (ref 140–400)
RBC: 4.7 10*6/uL (ref 3.80–5.10)
RDW: 12.3 % (ref 11.0–15.0)
TOTAL LYMPHOCYTE: 13.7 %
WBC mixed population: 542 cells/uL (ref 200–950)
WBC: 6.3 10*3/uL (ref 3.8–10.8)

## 2018-06-19 LAB — LIPID PANEL
Cholesterol: 203 mg/dL — ABNORMAL HIGH (ref <200)
HDL: 68 mg/dL (ref >50)
LDL CHOLESTEROL (CALC): 100 mg/dL — AB
Non-HDL Cholesterol (Calc): 135 mg/dL (calc) — ABNORMAL HIGH (ref <130)
TRIGLYCERIDES: 230 mg/dL — AB (ref <150)
Total CHOL/HDL Ratio: 3 (calc) (ref <5.0)

## 2018-06-19 LAB — COMPLETE METABOLIC PANEL WITH GFR
AG Ratio: 2 (calc) (ref 1.0–2.5)
ALT: 14 U/L (ref 6–29)
AST: 23 U/L (ref 10–35)
Albumin: 4.5 g/dL (ref 3.6–5.1)
Alkaline phosphatase (APISO): 90 U/L (ref 33–130)
BILIRUBIN TOTAL: 0.5 mg/dL (ref 0.2–1.2)
BUN/Creatinine Ratio: 10 (calc) (ref 6–22)
BUN: 11 mg/dL (ref 7–25)
CALCIUM: 9.6 mg/dL (ref 8.6–10.4)
CHLORIDE: 104 mmol/L (ref 98–110)
CO2: 27 mmol/L (ref 20–32)
Creat: 1.13 mg/dL — ABNORMAL HIGH (ref 0.60–0.93)
GFR, Est African American: 56 mL/min/{1.73_m2} — ABNORMAL LOW (ref >=60)
GFR, Est Non African American: 49 mL/min/{1.73_m2} — ABNORMAL LOW (ref >=60)
GLUCOSE: 137 mg/dL — AB (ref 65–99)
Globulin: 2.2 g/dL (calc) (ref 1.9–3.7)
Potassium: 4.7 mmol/L (ref 3.5–5.3)
Sodium: 141 mmol/L (ref 135–146)
TOTAL PROTEIN: 6.7 g/dL (ref 6.1–8.1)

## 2018-06-19 LAB — TSH: TSH: 1.01 m[IU]/L (ref 0.40–4.50)

## 2018-07-01 ENCOUNTER — Other Ambulatory Visit: Payer: Self-pay | Admitting: *Deleted

## 2018-07-01 ENCOUNTER — Other Ambulatory Visit (INDEPENDENT_AMBULATORY_CARE_PROVIDER_SITE_OTHER): Payer: Medicare Other

## 2018-07-01 DIAGNOSIS — N183 Chronic kidney disease, stage 3 unspecified: Secondary | ICD-10-CM

## 2018-07-01 LAB — BASIC METABOLIC PANEL WITH GFR
BUN: 11 mg/dL (ref 7–25)
CHLORIDE: 103 mmol/L (ref 98–110)
CO2: 27 mmol/L (ref 20–32)
Calcium: 9.6 mg/dL (ref 8.6–10.4)
Creat: 0.87 mg/dL (ref 0.60–0.93)
GFR, Est African American: 77 mL/min/{1.73_m2} (ref >=60)
GFR, Est Non African American: 67 mL/min/{1.73_m2} (ref >=60)
GLUCOSE: 99 mg/dL (ref 65–99)
Potassium: 5.4 mmol/L — ABNORMAL HIGH (ref 3.5–5.3)
SODIUM: 139 mmol/L (ref 135–146)

## 2018-07-30 ENCOUNTER — Other Ambulatory Visit: Payer: Self-pay | Admitting: Adult Health

## 2018-07-30 DIAGNOSIS — E2839 Other primary ovarian failure: Secondary | ICD-10-CM

## 2018-07-30 DIAGNOSIS — Z1239 Encounter for other screening for malignant neoplasm of breast: Secondary | ICD-10-CM

## 2018-08-15 DIAGNOSIS — Z01419 Encounter for gynecological examination (general) (routine) without abnormal findings: Secondary | ICD-10-CM | POA: Diagnosis not present

## 2018-09-05 ENCOUNTER — Ambulatory Visit
Admission: RE | Admit: 2018-09-05 | Discharge: 2018-09-05 | Disposition: A | Discharge status: Home / Self Care | Payer: Medicare Other | Source: Ambulatory Visit | Attending: Adult Health | Admitting: Adult Health

## 2018-09-05 ENCOUNTER — Encounter: Payer: Self-pay | Admitting: Adult Health

## 2018-09-05 DIAGNOSIS — E2839 Other primary ovarian failure: Secondary | ICD-10-CM

## 2018-09-05 DIAGNOSIS — Z780 Asymptomatic menopausal state: Secondary | ICD-10-CM | POA: Diagnosis not present

## 2018-09-05 DIAGNOSIS — Z1231 Encounter for screening mammogram for malignant neoplasm of breast: Secondary | ICD-10-CM | POA: Diagnosis not present

## 2018-09-05 DIAGNOSIS — M85852 Other specified disorders of bone density and structure, left thigh: Secondary | ICD-10-CM | POA: Diagnosis not present

## 2018-09-05 DIAGNOSIS — Z1239 Encounter for other screening for malignant neoplasm of breast: Secondary | ICD-10-CM

## 2018-09-05 DIAGNOSIS — M8580 Other specified disorders of bone density and structure, unspecified site: Secondary | ICD-10-CM | POA: Insufficient documentation

## 2018-09-17 NOTE — Progress Notes (Signed)
Patient ID: Jean Drake, female   DOB: [birth year], 72 y.o.   MRN: [id number]     Cardiology Office Note   Date:  09/25/2018   ID:  JANDY BRACKENS, DOB 1946/05/27, MRN [id number]  PCP:  Lucky Cowboy, MD  Cardiologist:   Charlton Haws, MD   No chief complaint on file.     History of Present Illness: Jean Drake is a 72 y.o. female who presents for f/u. Has not been seen since 2017  CRF;s  HTN, elevated lipids and borderline glucose.  History of SVT.  02/2011 reviewed notes from Dr Evern Bio ED.  SVT rate 210 in field Rx by EMS With adenosine and converted prior to arriving in ER.  ECG in ER only ST rate 110 normal QRS no WPW.  Recurrence Decmeber 2016 Rx Again with adenosine at [facility] Seen by Dr Elberta Fortis EP 12/20/15 not intereste in ablation and continued on atenolol   Gets infrequent palpitations Some lasting 8-10 minutes She can valsalva and breath slowly and episode Passes No chest pain syncope or dyspnea with it     Reviewed primary note 06/18/18 Family history of CAD Sister had " widow maker" out of the blue with no warning Mother has stents. She complains of a tight sensation in her chest infrequently at rest    Past Medical History:  Diagnosis Date  . Essential hypertension 10/20/2013  . 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 GERD 10/20/2013  . Hyperlipidemia 10/20/2013  . Migraine, unspecified, without mention of intractable migraine without mention of status migrainosus 10/20/2013  . Morbid obesity (HCC) 11/18/2014  . Prediabetes 10/20/2013  . SVT (supraventricular tachycardia) (HCC) 11/15/2015  . Vitamin D deficiency 10/20/2013    Past Surgical History:  Procedure Laterality Date  . NO PAST SURGERIES       Current Outpatient Medications  Medication Sig Dispense Refill  . ALPRAZolam (XANAX) 0.5 MG tablet TAKE ONE TABLET BY MOUTH TWICE DAILY AS NEEDED FOR ANXIETY 60 tablet 0  . aspirin 81 MG tablet Take 81 mg by mouth daily.    Marland Kitchen atenolol (TENORMIN) 50 MG tablet TAKE ONE TABLET BY  MOUTH EVERY DAY 90 tablet 1  . Cholecalciferol (VITAMIN D) 2000 UNITS tablet Take 4,000 Units by mouth daily.     . fexofenadine (ALLEGRA) 180 MG tablet Take 180 mg by mouth daily.    Marland Kitchen MAGNESIUM OXIDE PO Take 250 mg by mouth daily.    . Melatonin 3 MG TABS Take by mouth.    . Multiple Vitamin (MULTIVITAMIN) tablet Take 1 tablet by mouth daily.    . multivitamin-lutein (OCUVITE-LUTEIN) CAPS capsule Take 1 capsule by mouth daily.    . Omega-3 Fatty Acids (FISH OIL) 1000 MG CAPS Take 1 capsule by mouth daily.    Marland Kitchen OVER THE COUNTER MEDICATION Takes Vitamin B Complex 1 daily    . ranitidine (ZANTAC) 75 MG tablet Take 75 mg by mouth 2 (two) times daily.     No current facility-administered medications for this visit.     Allergies:   Penicillins; Codeine; and Sudafed [pseudoephedrine hcl]    Social History:  The patient  reports that she has never smoked. She has never used smokeless tobacco. She reports that she does not drink alcohol.   Family History:  The patient's family history includes Breast cancer in her mother; CAD in her mother; Heart attack (age of onset: 81) in her sister; Heart disease in her mother; Kidney failure in her paternal uncle; Lung cancer in  her father; Polycystic kidney disease in her paternal uncle; Stroke in her maternal grandmother.    ROS:  Please see the history of present illness.   Otherwise, review of systems are positive for none.   All other systems are reviewed and negative.    PHYSICAL EXAM: VS:  BP 136/78   Pulse 63   Ht 5' 1.5" (1.562 m)   Wt 145 lb 9.6 oz (66 kg)   SpO2 98%   BMI 27.07 kg/m  , BMI Body mass index is 27.07 kg/m. Affect appropriate Healthy:  appears stated age HEENT: normal Neck supple with no adenopathy JVP normal no bruits no thyromegaly Lungs clear with no wheezing and good diaphragmatic motion Heart:  S1/S2 no murmur, no rub, gallop or click PMI normal Abdomen: benighn, BS positve, no tenderness, no AAA no bruit.  No  HSM or HJR Distal pulses intact with no bruits No edema Neuro non-focal Skin warm and dry No muscular weakness    EKG:    08/30/17 SR nonspecific ST changes normal QT no pre excitation    Recent Labs: 09/22/2018: ALT 20; BUN 7; Creat 0.80; Hemoglobin 14.1; Magnesium 1.9; Platelets 340; Potassium 4.6; Sodium 136; TSH 1.49    Lipid Panel    Component Value Date/Time   CHOL 208 (H) 09/22/2018 1342   TRIG 106 09/22/2018 1342   HDL 73 09/22/2018 1342   CHOLHDL 2.8 09/22/2018 1342   VLDL 23 05/10/2017 1115   LDLCALC 114 (H) 09/22/2018 1342      Wt Readings from Last 3 Encounters:  09/25/18 145 lb 9.6 oz (66 kg)  09/22/18 146 lb 12.8 oz (66.6 kg)  06/18/18 149 lb (67.6 kg)      Other studies Reviewed: Additional studies/ records that were reviewed today include: [facility] ER notes 11/05/15 and Epic notes .    ASSESSMENT AND PLAN:  1.  SVT:  Quiescent on atenolol no structural heart disease by TTE No pre excitation on ECG observe 2. Family history of CAD.  Atypical pain Will order ETT and coronary calcium score 3. HTN:  Well controlled.  Continue current medications and low sodium Dash type diet.     Current medicines are reviewed at length with the patient today.  The patient does not have concerns regarding medicines.  The following changes have been made:  no change  Labs/ tests ordered today include: Calcium Score ETT No orders of the defined types were placed in this encounter.    Disposition:   FU with cardiology PRN     Signed, Charlton Haws, MD  09/25/2018 10:36 AM    [facility] [address] [place], [place], [place]  [address] Phone: [phone]; Fax: [phone]

## 2018-09-21 ENCOUNTER — Encounter: Payer: Self-pay | Admitting: Internal Medicine

## 2018-09-21 DIAGNOSIS — N183 Chronic kidney disease, stage 3 unspecified: Secondary | ICD-10-CM | POA: Insufficient documentation

## 2018-09-21 NOTE — Patient Instructions (Signed)
We Do NOT Approve of  [facility], [employer]  Our Patients  To Do Home Visits & We Do NOT Approve of [facility] > > > > > > > > > > > > > > > > > > > > > > > > > > > > > > > > > > > > > > >  Preventive Care for Adults  A healthy lifestyle and preventive care can promote health and wellness. Preventive health guidelines for women include the following key practices.  A routine yearly physical is a good way to check with your health care provider about your health and preventive screening. It is a chance to share any concerns and updates on your health and to receive a thorough exam.  Visit your dentist for a routine exam and preventive care every 6 months. Brush your teeth twice a day and floss once a day. Good oral hygiene prevents tooth decay and gum disease.  The frequency of eye exams is based on your age, health, family medical history, use of contact lenses, and other factors. Follow your health care provider's recommendations for frequency of eye exams.  Eat a healthy diet. Foods like vegetables, fruits, whole grains, low-fat dairy products, and lean protein foods contain the nutrients you need without too many calories. Decrease your intake of foods high in solid fats, added sugars, and salt. Eat the right amount of calories for you. Get information about a proper diet from your health care provider, if necessary.  Regular physical exercise is one of the most important things you can do for your health. Most adults should get at least 150 minutes of moderate-intensity exercise (any activity that increases your heart rate and causes you to sweat) each week. In addition, most adults need muscle-strengthening exercises on 2 or more days a week.  Maintain a healthy weight. The body mass index (BMI) is a screening tool to identify possible weight problems. It provides an estimate of body fat based on height and weight. Your health care provider can find your BMI  and can help you achieve or maintain a healthy weight. For adults 20 years and older:  A BMI below 18.5 is considered underweight.  A BMI of 18.5 to 24.9 is normal.  A BMI of 25 to 29.9 is considered overweight.  A BMI of 30 and above is considered obese.  Maintain normal blood lipids and cholesterol levels by exercising and minimizing your intake of saturated fat. Eat a balanced diet with plenty of fruit and vegetables. If your lipid or cholesterol levels are high, you are over 50, or you are at high risk for heart disease, you may need your cholesterol levels checked more frequently. Ongoing high lipid and cholesterol levels should be treated with medicines if diet and exercise are not working.  If you smoke, find out from your health care provider how to quit. If you do not use tobacco, do not start.  Lung cancer screening is recommended for adults aged 52-80 years who are at high risk for developing lung cancer because of a history of smoking. A yearly low-dose CT scan of the lungs is recommended for people who have at least a 30-pack-year history of smoking and are a current smoker or have quit within the past 15 years. A pack year of smoking is smoking an average of 1 pack of cigarettes a day for 1 year (for example: 1 pack a day for 30 years or 2 packs a day  for 15 years). Yearly screening should continue until the smoker has stopped smoking for at least 15 years. Yearly screening should be stopped for people who develop a health problem that would prevent them from having lung cancer treatment.  Avoid use of street drugs. Do not share needles with anyone. Ask for help if you need support or instructions about stopping the use of drugs.  High blood pressure causes heart disease and increases the risk of stroke.  Ongoing high blood pressure should be treated with medicines if weight loss and exercise do not work.  If you are 29-34 years old, ask your health care provider if you should take  aspirin to prevent strokes.  Diabetes screening involves taking a blood sample to check your fasting blood sugar level. This should be done once every 3 years, after age 27, if you are within normal weight and without risk factors for diabetes. Testing should be considered at a younger age or be carried out more frequently if you are overweight and have at least 1 risk factor for diabetes.  Breast cancer screening is essential preventive care for women. You should practice "breast self-awareness." This means understanding the normal appearance and feel of your breasts and may include breast self-examination. Any changes detected, no matter how small, should be reported to a health care provider. Women in their 64s and 30s should have a clinical breast exam (CBE) by a health care provider as part of a regular health exam every 1 to 3 years. After age 69, women should have a CBE every year. Starting at age 6, women should consider having a mammogram (breast X-ray test) every year. Women who have a family history of breast cancer should talk to their health care provider about genetic screening. Women at a high risk of breast cancer should talk to their health care providers about having an MRI and a mammogram every year.  Breast cancer gene (BRCA)-related cancer risk assessment is recommended for women who have family members with BRCA-related cancers. BRCA-related cancers include breast, ovarian, tubal, and peritoneal cancers. Having family members with these cancers may be associated with an increased risk for harmful changes (mutations) in the breast cancer genes BRCA1 and BRCA2. Results of the assessment will determine the need for genetic counseling and BRCA1 and BRCA2 testing.  Routine pelvic exams to screen for cancer are no longer recommended for nonpregnant women who are considered low risk for cancer of the pelvic organs (ovaries, uterus, and vagina) and who do not have symptoms. Ask your health  care provider if a screening pelvic exam is right for you.  If you have had past treatment for cervical cancer or a condition that could lead to cancer, you need Pap tests and screening for cancer for at least 20 years after your treatment. If Pap tests have been discontinued, your risk factors (such as having a new sexual partner) need to be reassessed to determine if screening should be resumed. Some women have medical problems that increase the chance of getting cervical cancer. In these cases, your health care provider may recommend more frequent screening and Pap tests.    Colorectal cancer can be detected and often prevented. Most routine colorectal cancer screening begins at the age of 21 years and continues through age 8 years. However, your health care provider may recommend screening at an earlier age if you have risk factors for colon cancer. On a yearly basis, your health care provider may provide home test kits to check  for hidden blood in the stool. Use of a small camera at the end of a tube, to directly examine the colon (sigmoidoscopy or colonoscopy), can detect the earliest forms of colorectal cancer. Talk to your health care provider about this at age 50, when routine screening begins.  Direct exam of the colon should be repeated every 5-10 years through age 75 years, unless early forms of pre-cancerous polyps or small growths are found.  Osteoporosis is a disease in which the bones lose minerals and strength with aging. This can result in serious bone fractures or breaks. The risk of osteoporosis can be identified using a bone density scan. Women ages 65 years and over and women at risk for fractures or osteoporosis should discuss screening with their health care providers. Ask your health care provider whether you should take a calcium supplement or vitamin D to reduce the rate of osteoporosis.  Menopause can be associated with physical symptoms and risks. Hormone replacement therapy  is available to decrease symptoms and risks. You should talk to your health care provider about whether hormone replacement therapy is right for you.  Use sunscreen. Apply sunscreen liberally and repeatedly throughout the day. You should seek shade when your shadow is shorter than you. Protect yourself by wearing long sleeves, pants, a wide-brimmed hat, and sunglasses year round, whenever you are outdoors.  Once a month, do a whole body skin exam, using a mirror to look at the skin on your back. Tell your health care provider of new moles, moles that have irregular borders, moles that are larger than a pencil eraser, or moles that have changed in shape or color.  Stay current with required vaccines (immunizations).  Influenza vaccine. All adults should be immunized every year.  Tetanus, diphtheria, and acellular pertussis (Td, Tdap) vaccine. Pregnant women should receive 1 dose of Tdap vaccine during each pregnancy. The dose should be obtained regardless of the length of time since the last dose. Immunization is preferred during the 27th-36th week of gestation. An adult who has not previously received Tdap or who does not know her vaccine status should receive 1 dose of Tdap. This initial dose should be followed by tetanus and diphtheria toxoids (Td) booster doses every 10 years. Adults with an unknown or incomplete history of completing a 3-dose immunization series with Td-containing vaccines should begin or complete a primary immunization series including a Tdap dose. Adults should receive a Td booster every 10 years.    Zoster vaccine. One dose is recommended for adults aged 60 years or older unless certain conditions are present.    Pneumococcal 13-valent conjugate (PCV13) vaccine. When indicated, a person who is uncertain of her immunization history and has no record of immunization should receive the PCV13 vaccine. An adult aged 19 years or older who has certain medical conditions and has not  been previously immunized should receive 1 dose of PCV13 vaccine. This PCV13 should be followed with a dose of pneumococcal polysaccharide (PPSV23) vaccine. The PPSV23 vaccine dose should be obtained at least 1 or more year(s) after the dose of PCV13 vaccine. An adult aged 19 years or older who has certain medical conditions and previously received 1 or more doses of PPSV23 vaccine should receive 1 dose of PCV13. The PCV13 vaccine dose should be obtained 1 or more years after the last PPSV23 vaccine dose.    Pneumococcal polysaccharide (PPSV23) vaccine. When PCV13 is also indicated, PCV13 should be obtained first. All adults aged 65 years and older should   be immunized. An adult younger than age 25 years who has certain medical conditions should be immunized. Any person who resides in a nursing home or long-term care facility should be immunized. An adult smoker should be immunized. People with an immunocompromised condition and certain other conditions should receive both PCV13 and PPSV23 vaccines. People with human immunodeficiency virus (HIV) infection should be immunized as soon as possible after diagnosis. Immunization during chemotherapy or radiation therapy should be avoided. Routine use of PPSV23 vaccine is not recommended for American Indians, [place]atives, or people younger than 65 years unless there are medical conditions that require PPSV23 vaccine. When indicated, people who have unknown immunization and have no record of immunization should receive PPSV23 vaccine. One-time revaccination 5 years after the first dose of PPSV23 is recommended for people aged 19-64 years who have chronic kidney failure, nephrotic syndrome, asplenia, or immunocompromised conditions. People who received 1-2 doses of PPSV23 before age 44 years should receive another dose of PPSV23 vaccine at age 92 years or later if at least 5 years have passed since the previous dose. Doses of PPSV23 are not needed for people immunized  with PPSV23 at or after age 14 years.   Preventive Services / Frequency  Ages 22 years and over  Blood pressure check.  Lipid and cholesterol check.  Lung cancer screening. / Every year if you are aged 74-80 years and have a 30-pack-year history of smoking and currently smoke or have quit within the past 15 years. Yearly screening is stopped once you have quit smoking for at least 15 years or develop a health problem that would prevent you from having lung cancer treatment.  Clinical breast exam.** / Every year after age 83 years.   BRCA-related cancer risk assessment.** / For women who have family members with a BRCA-related cancer (breast, ovarian, tubal, or peritoneal cancers).  Mammogram.** / Every year beginning at age 27 years and continuing for as long as you are in good health. Consult with your health care provider.  Pap test.** / Every 3 years starting at age 35 years through age 73 or 12 years with 3 consecutive normal Pap tests. Testing can be stopped between 65 and 70 years with 3 consecutive normal Pap tests and no abnormal Pap or HPV tests in the past 10 years.  Fecal occult blood test (FOBT) of stool. / Every year beginning at age 23 years and continuing until age 65 years. You may not need to do this test if you get a colonoscopy every 10 years.  Flexible sigmoidoscopy or colonoscopy.** / Every 5 years for a flexible sigmoidoscopy or every 10 years for a colonoscopy beginning at age 110 years and continuing until age 36 years.  Hepatitis C blood test.** / For all people born from [birth year] through 1965 and any individual with known risks for hepatitis C.  Osteoporosis screening.** / A one-time screening for women ages 55 years and over and women at risk for fractures or osteoporosis.  Skin self-exam. / Monthly.  Influenza vaccine. / Every year.  Tetanus, diphtheria, and acellular pertussis (Tdap/Td) vaccine.** / 1 dose of Td every 10 years.  Zoster vaccine.** / 1 dose  for adults aged 69 years or older.  Pneumococcal 13-valent conjugate (PCV13) vaccine.** / Consult your health care provider.  Pneumococcal polysaccharide (PPSV23) vaccine.** / 1 dose for all adults aged 69 years and older. Screening for abdominal aortic aneurysm (AAA)  by ultrasound is recommended for people who have history of high blood pressure  or who are current or former smokers. ++++++++++++++++++++ Recommend Adult Low Dose Aspirin or  coated  Aspirin 81 mg daily  To reduce risk of Colon Cancer 20 %,  Skin Cancer 26 % ,  Melanoma 46%  and  Pancreatic cancer 60% ++++++++++++++++++++ Vitamin D goal  is between 70-100.  Please make sure that you are taking your Vitamin D as directed.  It is very important as a natural anti-inflammatory  helping hair, skin, and nails, as well as reducing stroke and heart attack risk.  It helps your bones and helps with mood. It also decreases numerous cancer risks so please take it as directed.  Low Vit D is associated with a 200-300% higher risk for CANCER  and 200-300% higher risk for HEART   ATTACK  &  STROKE.   ...................................... It is also associated with higher death rate at younger ages,  autoimmune diseases like Rheumatoid arthritis, Lupus, Multiple Sclerosis.    Also many other serious conditions, like depression, Alzheimer's Dementia, infertility, muscle aches, fatigue, fibromyalgia - just to name a few. ++++++++++++++++++ Recommend the book "The END of DIETING" by Dr Joel Fuhrman  & the book "The END of DIABETES " by Dr Joel Fuhrman At [employer] - get book & Audio CD's    Being diabetic has a  300% increased risk for heart attack, stroke, cancer, and alzheimer- type vascular dementia. It is very important that you work harder with diet by avoiding all foods that are white. Avoid white rice (brown & wild rice is OK), white potatoes (sweetpotatoes in moderation is OK), White bread or wheat bread or anything made out of  white flour like bagels, donuts, rolls, buns, biscuits, cakes, pastries, cookies, pizza crust, and pasta (made from white flour & egg whites) - vegetarian pasta or spinach or wheat pasta is OK. Multigrain breads like Arnold's or Pepperidge [employer], or multigrain sandwich thins or flatbreads.  Diet, exercise and weight loss can reverse and cure diabetes in the early stages.  Diet, exercise and weight loss is very important in the control and prevention of complications of diabetes which affects every system in your body, ie. Brain - dementia/stroke, eyes - glaucoma/blindness, heart - heart attack/heart failure, kidneys - dialysis, stomach - gastric paralysis, intestines - malabsorption, nerves - severe painful neuritis, circulation - gangrene & loss of a leg(s), and finally cancer and Alzheimers.    I recommend avoid fried & greasy foods,  sweets/candy, white rice (brown or wild rice or Quinoa is OK), white potatoes (sweet potatoes are OK) - anything made from white flour - bagels, doughnuts, rolls, buns, biscuits,white and wheat breads, pizza crust and traditional pasta made of white flour & egg white(vegetarian pasta or spinach or wheat pasta is OK).  Multi-grain bread is OK - like multi-grain flat bread or sandwich thins. Avoid alcohol in excess. Exercise is also important.    Eat all the vegetables you want - avoid meat, especially red meat and dairy - especially cheese.  Cheese is the most concentrated form of trans-fats which is the worst thing to clog up our arteries. Veggie cheese is OK which can be found in the fresh produce section at [employer] or [employer] or [employer]  +++++++++++++++++++ DASH Eating Plan  DASH stands for "Dietary Approaches to Stop Hypertension."   The DASH eating plan is a healthy eating plan that has been shown to reduce high blood pressure (hypertension). Additional health benefits may include reducing the risk of type 2 diabetes mellitus, heart disease,   and stroke. The  DASH eating plan may also help with weight loss. WHAT DO I NEED TO KNOW ABOUT THE DASH EATING PLAN? For the DASH eating plan, you will follow these general guidelines:  Choose foods with a percent daily value for sodium of less than 5% (as listed on the food label).  Use salt-free seasonings or herbs instead of table salt or sea salt.  Check with your health care provider or pharmacist before using salt substitutes.  Eat lower-sodium products, often labeled as "lower sodium" or "no salt added."  Eat fresh foods.  Eat more vegetables, fruits, and low-fat dairy products.  Choose whole grains. Look for the word "whole" as the first word in the ingredient list.  Choose fish   Limit sweets, desserts, sugars, and sugary drinks.  Choose heart-healthy fats.  Eat veggie cheese   Eat more home-cooked food and less restaurant, buffet, and fast food.  Limit fried foods.  Cook foods using methods other than frying.  Limit canned vegetables. If you do use them, rinse them well to decrease the sodium.  When eating at a restaurant, ask that your food be prepared with less salt, or no salt if possible.                      WHAT FOODS CAN I EAT? Read Dr Fara Olden Fuhrman's books on The End of Dieting & The End of Diabetes  Grains Whole grain or whole wheat bread. Brown rice. Whole grain or whole wheat pasta. Quinoa, bulgur, and whole grain cereals. Low-sodium cereals. Corn or whole wheat flour tortillas. Whole grain cornbread. Whole grain crackers. Low-sodium crackers.  Vegetables Fresh or frozen vegetables (raw, steamed, roasted, or grilled). Low-sodium or reduced-sodium tomato and vegetable juices. Low-sodium or reduced-sodium tomato sauce and paste. Low-sodium or reduced-sodium canned vegetables.   Fruits All fresh, canned (in natural juice), or frozen fruits.  Protein Products  All fish and seafood.  Dried beans, peas, or lentils. Unsalted nuts and seeds. Unsalted canned  beans.  Dairy Low-fat dairy products, such as skim or 1% milk, 2% or reduced-fat cheeses, low-fat ricotta or cottage cheese, or plain low-fat yogurt. Low-sodium or reduced-sodium cheeses.  Fats and Oils Tub margarines without trans fats. Light or reduced-fat mayonnaise and salad dressings (reduced sodium). Avocado. Safflower, olive, or canola oils. Natural peanut or almond butter.  Other Unsalted popcorn and pretzels. The items listed above may not be a complete list of recommended foods or beverages. Contact your dietitian for more options.  +++++++++++++++  WHAT FOODS ARE NOT RECOMMENDED? Grains/ White flour or wheat flour White bread. White pasta. White rice. Refined cornbread. Bagels and croissants. Crackers that contain trans fat.  Vegetables  Creamed or fried vegetables. Vegetables in a . Regular canned vegetables. Regular canned tomato sauce and paste. Regular tomato and vegetable juices.  Fruits Dried fruits. Canned fruit in light or heavy syrup. Fruit juice.  Meat and Other Protein Products Meat in general - RED meat & White meat.  Fatty cuts of meat. Ribs, chicken wings, all processed meats as bacon, sausage, bologna, salami, fatback, hot dogs, bratwurst and packaged luncheon meats.  Dairy Whole or 2% milk, cream, half-and-half, and cream cheese. Whole-fat or sweetened yogurt. Full-fat cheeses or blue cheese. Non-dairy creamers and whipped toppings. Processed cheese, cheese spreads, or cheese curds.  Condiments Onion and garlic salt, seasoned salt, table salt, and sea salt. Canned and packaged gravies. Worcestershire sauce. Tartar sauce. Barbecue sauce. Teriyaki sauce. Soy sauce, including reduced  sodium. Steak sauce. Fish sauce. Oyster sauce. Cocktail sauce. Horseradish. Ketchup and mustard. Meat flavorings and tenderizers. Bouillon cubes. Hot sauce. Tabasco sauce. Marinades. Taco seasonings. Relishes.  Fats and Oils Butter, stick margarine, lard, shortening and bacon  fat. Coconut, palm kernel, or palm oils. Regular salad dressings.  Pickles and olives. Salted popcorn and pretzels.  The items listed above may not be a complete list of foods and beverages to avoid.

## 2018-09-21 NOTE — Progress Notes (Signed)
[facility] Lucky Cowboy, M.D.     Dyanne Carrel. Steffanie Dunn, P.A.-C Judd Gaudier, DNP [facility] [address] [place], [place]. [address] Telephone [phone] Telefax [phone] Annual Screening/Preventative Visit & Comprehensive Evaluation &  Examination     This very nice 72 y.o. MWF presents for a Screening /Preventative Visit & comprehensive evaluation and management of multiple medical co-morbidities.  Patient has been followed for HTN, HLD, Prediabetes  and Vitamin D Deficiency. Patient has GERD controlled w/diet & her meds:      HTN predates circa 2012. Patient's BP has been controlled at home and patient denies any cardiac symptoms as chest pain, palpitations, shortness of breath, dizziness or ankle swelling.  Patient does has CKD3 attributed to her HTN. Today's BP is at goal -  124/86.      Patient's hyperlipidemia is controlled with diet and medications. Patient denies myalgias or other medication SE's. Last lipids were at goal albeit elevated Trig's:  Lab Results  Component Value Date   CHOL 203 (H) 06/18/2018   HDL 68 06/18/2018   LDLCALC 100 (H) 06/18/2018   TRIG 230 (H) 06/18/2018   CHOLHDL 3.0 06/18/2018      Patient is monitored expectantly for  prediabetes and patient denies reactive hypoglycemic symptoms, visual blurring, diabetic polys or paresthesias. Last A1c was Normal & at goal: Lab Results  Component Value Date   HGBA1C 5.6 03/12/2018      Finally, patient has history of Vitamin D Deficiency ("9" / 2009)  and last Vitamin D was at goal: Lab Results  Component Value Date   VD25OH 76 03/12/2018   Current Outpatient Medications on File Prior to Visit  Medication Sig  . ALPRAZolam (XANAX) 0.5 MG tablet TAKE ONE TABLET BY MOUTH TWICE DAILY AS NEEDED FOR ANXIETY  . aspirin 81 MG tablet Take 81 mg by mouth daily.  Marland Kitchen atenolol (TENORMIN) 50 MG tablet TAKE ONE TABLET BY MOUTH EVERY DAY  .  Cholecalciferol (VITAMIN D) 2000 UNITS tablet Take 4,000 Units by mouth daily.   . fexofenadine (ALLEGRA) 180 MG tablet Take 180 mg by mouth daily.  Marland Kitchen MAGNESIUM OXIDE PO Take 250 mg by mouth daily.  . Melatonin 3 MG TABS Take by mouth.  . Multiple Vitamin (MULTIVITAMIN) tablet Take 1 tablet by mouth daily.  . multivitamin-lutein (OCUVITE-LUTEIN) CAPS capsule Take 1 capsule by mouth daily.  . Omega-3 Fatty Acids (FISH OIL) 1000 MG CAPS Take 1 capsule by mouth daily.  Marland Kitchen OVER THE COUNTER MEDICATION Takes Vitamin B Complex 1 daily  . ranitidine (ZANTAC) 75 MG tablet Take 75 mg by mouth 2 (two) times daily.   No current facility-administered medications on file prior to visit.    Allergies  Allergen Reactions  . Penicillins Anaphylaxis  . Codeine Nausea And Vomiting  . Sudafed [Pseudoephedrine Hcl]     Makes heart race, per pt    Past Medical History:  Diagnosis Date  . Essential hypertension 10/20/2013  . 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 GERD 10/20/2013  . Hyperlipidemia 10/20/2013  . Migraine, unspecified, without mention of intractable migraine without mention of status migrainosus 10/20/2013  . Morbid obesity (HCC) 11/18/2014  . Prediabetes 10/20/2013  . SVT (supraventricular tachycardia) (HCC) 11/15/2015  . Vitamin D deficiency 10/20/2013   Health Maintenance  Topic Date Due  . COLONOSCOPY  06/12/2017  . INFLUENZA VACCINE  10/13/2019 (Originally 06/12/2018)  . MAMMOGRAM  09/05/2020  . TETANUS/TDAP  09/13/2023  . DEXA SCAN  Completed  .  Hepatitis C Screening  Completed  . PNA vac Low Risk Adult  Discontinued   Immunization History  Administered Date(s) Administered  . DTaP 09/12/2013  . Influenza Split 09/12/2013  . Influenza, High Dose Seasonal PF 10/04/2014, 08/30/2017  . Pneumococcal Conjugate-13 10/04/2014  . Pneumococcal Polysaccharide-23 03/29/2009  . Td 12/20/2003  . Tdap 09/12/2013  . Zoster 06/07/2010   Last Colon - 06/13/2007 - Dr Arlyce Dice - Negative   Last MGM -  09/08/2018  Past Surgical History:  Procedure Laterality Date  . NO PAST SURGERIES     Family History  Problem Relation Age of Onset  . Heart disease Mother   . Breast cancer Mother   . CAD Mother        stents emergently in her 73s  . Lung cancer Father        smoker, mets to brain  . Stroke Maternal Grandmother   . Polycystic kidney disease Paternal Uncle   . Kidney failure Paternal Uncle   . Heart attack Sister 71   Social History   Tobacco Use  . Smoking status: Never Smoker  . Smokeless tobacco: Never Used  Substance Use Topics  . Alcohol use: No  . Drug use: Not on file    ROS Constitutional: Denies fever, chills, weight loss/gain, headaches, insomnia,  night sweats, and change in appetite. Does c/o fatigue. Eyes: Denies redness, blurred vision, diplopia, discharge, itchy, watery eyes.  ENT: Denies discharge, congestion, post nasal drip, epistaxis, sore throat, earache, hearing loss, dental pain, Tinnitus, Vertigo, Sinus pain, snoring.  Cardio: Denies chest pain, palpitations, irregular heartbeat, syncope, dyspnea, diaphoresis, orthopnea, PND, claudication, edema Respiratory: denies cough, dyspnea, DOE, pleurisy, hoarseness, laryngitis, wheezing.  Gastrointestinal: Denies dysphagia, heartburn, reflux, water brash, pain, cramps, nausea, vomiting, bloating, diarrhea, constipation, hematemesis, melena, hematochezia, jaundice, hemorrhoids Genitourinary: Denies dysuria, frequency, urgency, nocturia, hesitancy, discharge, hematuria, flank pain Breast: Breast lumps, nipple discharge, bleeding.  Musculoskeletal: Denies arthralgia, myalgia, stiffness, Jt. Swelling, pain, limp, and strain/sprain. Denies falls. Skin: Denies puritis, rash, hives, warts, acne, eczema, changing in skin lesion Neuro: No weakness, tremor, incoordination, spasms, paresthesia, pain Psychiatric: Denies confusion, memory loss, sensory loss. Denies Depression. Endocrine: Denies change in weight, skin, hair  change, nocturia, and paresthesia, diabetic polys, visual blurring, hyper / hypo glycemic episodes.  Heme/Lymph: No excessive bleeding, bruising, enlarged lymph nodes.  Physical Exam  BP 124/86   Pulse 66   Temp 98.2 F (36.8 C)   Resp 16   Ht 5' 1.5" (1.562 m)   Wt 146 lb 12.8 oz (66.6 kg)   SpO2 98%   BMI 27.29 kg/m   General Appearance: Well nourished, well groomed and in no apparent distress.  Eyes: PERRLA, EOMs, conjunctiva no swelling or erythema, normal fundi and vessels. Sinuses: No frontal/maxillary tenderness ENT/Mouth: EACs patent / TMs  nl. Nares clear without erythema, swelling, mucoid exudates. Oral hygiene is good. No erythema, swelling, or exudate. Tongue normal, non-obstructing. Tonsils not swollen or erythematous. Hearing normal.  Neck: Supple, thyroid not palpable. No bruits, nodes or JVD. Respiratory: Respiratory effort normal.  BS equal and clear bilateral without rales, rhonci, wheezing or stridor. Cardio: Heart sounds are normal with regular rate and rhythm and no murmurs, rubs or gallops. Peripheral pulses are normal and equal bilaterally without edema. No aortic or femoral bruits. Chest: symmetric with normal excursions and percussion. Breasts: Symmetric, without lumps, nipple discharge, retractions, or fibrocystic changes.  Abdomen: Flat, soft with bowel sounds active. Nontender, no guarding, rebound, hernias, masses, or organomegaly.  Lymphatics:  Non tender without lymphadenopathy.  Genitourinary:  Musculoskeletal: Full ROM all peripheral extremities, joint stability, 5/5 strength, and normal gait. Skin: Warm and dry without rashes, lesions, cyanosis, clubbing or  ecchymosis.  Neuro: Cranial nerves intact, reflexes equal bilaterally. Normal muscle tone, no cerebellar symptoms. Sensation intact.  Pysch: Alert and oriented X 3, normal affect, Insight and Judgment appropriate.   Assessment and Plan  1. Annual Preventative Screening Examination  2.  Essential hypertension  - EKG 12-Lead - Urinalysis, Routine w reflex microscopic - Microalbumin / creatinine urine ratio - CBC with Differential/Platelet - COMPLETE METABOLIC PANEL WITH GFR - Magnesium - TSH  3. Hyperlipidemia, mixed  - EKG 12-Lead - Lipid panel - TSH  4. Abnormal glucose  - EKG 12-Lead - Hemoglobin A1c - Insulin, random  5. Vitamin D deficiency  - VITAMIN D 25 Hydroxyl  6. CKD (chronic kidney disease) stage 3, GFR 30-59 ml/min (HCC)  - COMPLETE METABOLIC PANEL WITH GFR  7. Screening for colorectal cancer  - POC Hemoccult Bld/Stl  8. Screening for ischemic heart disease  - EKG 12-Lead  9. FHx: heart disease  - EKG 12-Lead  10. Medication management  - Urinalysis, Routine w reflex microscopic - Microalbumin / creatinine urine ratio - CBC with Differential/Platelet - COMPLETE METABOLIC PANEL WITH GFR - Magnesium - Lipid panel - TSH - Hemoglobin A1c - Insulin, random - VITAMIN D 25 Hydroxy       Patient was counseled in prudent diet to achieve/maintain BMI less than 25 for weight control, BP monitoring, regular exercise and medications. Discussed med's effects and SE's. Screening labs and tests as requested with regular follow-up as recommended. Over 40 minutes of exam, counseling, chart review and high complex critical decision making was performed.

## 2018-09-22 ENCOUNTER — Ambulatory Visit: Payer: Medicare Other | Admitting: Internal Medicine

## 2018-09-22 ENCOUNTER — Encounter: Payer: Self-pay | Admitting: Internal Medicine

## 2018-09-22 VITALS — BP 124/86 | HR 66 | Temp 98.2°F | Resp 16 | Ht 61.5 in | Wt 146.8 lb

## 2018-09-22 DIAGNOSIS — Z1211 Encounter for screening for malignant neoplasm of colon: Secondary | ICD-10-CM

## 2018-09-22 DIAGNOSIS — Z0000 Encounter for general adult medical examination without abnormal findings: Secondary | ICD-10-CM

## 2018-09-22 DIAGNOSIS — Z0001 Encounter for general adult medical examination with abnormal findings: Secondary | ICD-10-CM

## 2018-09-22 DIAGNOSIS — R7309 Other abnormal glucose: Secondary | ICD-10-CM

## 2018-09-22 DIAGNOSIS — E782 Mixed hyperlipidemia: Secondary | ICD-10-CM

## 2018-09-22 DIAGNOSIS — Z79899 Other long term (current) drug therapy: Secondary | ICD-10-CM

## 2018-09-22 DIAGNOSIS — N183 Chronic kidney disease, stage 3 unspecified: Secondary | ICD-10-CM

## 2018-09-22 DIAGNOSIS — I10 Essential (primary) hypertension: Secondary | ICD-10-CM

## 2018-09-22 DIAGNOSIS — Z8249 Family history of ischemic heart disease and other diseases of the circulatory system: Secondary | ICD-10-CM

## 2018-09-22 DIAGNOSIS — Z136 Encounter for screening for cardiovascular disorders: Secondary | ICD-10-CM | POA: Diagnosis not present

## 2018-09-22 DIAGNOSIS — Z1212 Encounter for screening for malignant neoplasm of rectum: Secondary | ICD-10-CM

## 2018-09-22 DIAGNOSIS — E559 Vitamin D deficiency, unspecified: Secondary | ICD-10-CM

## 2018-09-23 LAB — LIPID PANEL
CHOL/HDL RATIO: 2.8 (calc) (ref <5.0)
Cholesterol: 208 mg/dL — ABNORMAL HIGH (ref <200)
HDL: 73 mg/dL (ref >50)
LDL CHOLESTEROL (CALC): 114 mg/dL — AB
Non-HDL Cholesterol (Calc): 135 mg/dL (calc) — ABNORMAL HIGH (ref <130)
TRIGLYCERIDES: 106 mg/dL (ref <150)

## 2018-09-23 LAB — CBC WITH DIFFERENTIAL/PLATELET
BASOS PCT: 0.9 %
Basophils Absolute: 50 cells/uL (ref 0–200)
Eosinophils Absolute: 151 cells/uL (ref 15–500)
Eosinophils Relative: 2.7 %
HEMATOCRIT: 43.1 % (ref 35.0–45.0)
Hemoglobin: 14.1 g/dL (ref 11.7–15.5)
LYMPHS ABS: 846 {cells}/uL — AB (ref 850–3900)
MCH: 28.5 pg (ref 27.0–33.0)
MCHC: 32.7 g/dL (ref 32.0–36.0)
MCV: 87.1 fL (ref 80.0–100.0)
MPV: 9.5 fL (ref 7.5–12.5)
Monocytes Relative: 9.2 %
NEUTROS PCT: 72.1 %
Neutro Abs: 4038 cells/uL (ref 1500–7800)
Platelets: 340 10*3/uL (ref 140–400)
RBC: 4.95 10*6/uL (ref 3.80–5.10)
RDW: 12 % (ref 11.0–15.0)
Total Lymphocyte: 15.1 %
WBC: 5.6 10*3/uL (ref 3.8–10.8)
WBCMIX: 515 {cells}/uL (ref 200–950)

## 2018-09-23 LAB — URINALYSIS, ROUTINE W REFLEX MICROSCOPIC
BILIRUBIN URINE: NEGATIVE
Glucose, UA: NEGATIVE
Hgb urine dipstick: NEGATIVE
KETONES UR: NEGATIVE
Leukocytes, UA: NEGATIVE
Nitrite: NEGATIVE
PH: 7 (ref 5.0–8.0)
Protein, ur: NEGATIVE
SPECIFIC GRAVITY, URINE: 1.004 (ref 1.001–1.03)

## 2018-09-23 LAB — INSULIN, RANDOM: Insulin: 4.8 u[IU]/mL (ref 2.0–19.6)

## 2018-09-23 LAB — COMPLETE METABOLIC PANEL WITH GFR
AG Ratio: 1.7 (calc) (ref 1.0–2.5)
ALBUMIN MSPROF: 4.3 g/dL (ref 3.6–5.1)
ALT: 20 U/L (ref 6–29)
AST: 28 U/L (ref 10–35)
Alkaline phosphatase (APISO): 83 U/L (ref 33–130)
BUN: 7 mg/dL (ref 7–25)
CALCIUM: 9.7 mg/dL (ref 8.6–10.4)
CO2: 28 mmol/L (ref 20–32)
CREATININE: 0.8 mg/dL (ref 0.60–0.93)
Chloride: 101 mmol/L (ref 98–110)
GFR, EST NON AFRICAN AMERICAN: 74 mL/min/{1.73_m2} (ref >=60)
GFR, Est African American: 85 mL/min/{1.73_m2} (ref >=60)
GLOBULIN: 2.6 g/dL (ref 1.9–3.7)
GLUCOSE: 84 mg/dL (ref 65–99)
Potassium: 4.6 mmol/L (ref 3.5–5.3)
SODIUM: 136 mmol/L (ref 135–146)
Total Bilirubin: 0.4 mg/dL (ref 0.2–1.2)
Total Protein: 6.9 g/dL (ref 6.1–8.1)

## 2018-09-23 LAB — MICROALBUMIN / CREATININE URINE RATIO: Creatinine, Urine: 14 mg/dL — ABNORMAL LOW (ref 20–275)

## 2018-09-23 LAB — MAGNESIUM: Magnesium: 1.9 mg/dL (ref 1.5–2.5)

## 2018-09-23 LAB — HEMOGLOBIN A1C
Hgb A1c MFr Bld: 5.5 % of total Hgb (ref <5.7)
Mean Plasma Glucose: 111 (calc)
eAG (mmol/L): 6.2 (calc)

## 2018-09-23 LAB — VITAMIN D 25 HYDROXY (VIT D DEFICIENCY, FRACTURES): Vit D, 25-Hydroxy: 61 ng/mL (ref 30–100)

## 2018-09-23 LAB — TSH: TSH: 1.49 mIU/L (ref 0.40–4.50)

## 2018-09-25 ENCOUNTER — Encounter: Payer: Self-pay | Admitting: Cardiovascular Disease

## 2018-09-25 ENCOUNTER — Ambulatory Visit: Payer: Medicare Other | Admitting: Cardiovascular Disease

## 2018-09-25 VITALS — BP 136/78 | HR 63 | Ht 61.5 in | Wt 145.6 lb

## 2018-09-25 DIAGNOSIS — I10 Essential (primary) hypertension: Secondary | ICD-10-CM

## 2018-09-25 DIAGNOSIS — I471 Supraventricular tachycardia, unspecified: Secondary | ICD-10-CM

## 2018-09-25 DIAGNOSIS — R079 Chest pain, unspecified: Secondary | ICD-10-CM

## 2018-09-25 DIAGNOSIS — Z8249 Family history of ischemic heart disease and other diseases of the circulatory system: Secondary | ICD-10-CM

## 2018-09-25 NOTE — Patient Instructions (Addendum)
Medication Instructions:   If you need a refill on your cardiac medications before your next appointment, please call your pharmacy.   Lab work:  If you have labs (blood work) drawn today and your tests are completely normal, you will receive your results only by: Marland Kitchen. MyChart Message (if you have MyChart) OR . A paper copy in the mail If you have any lab test that is abnormal or we need to change your treatment, we will call you to review the results.  Testing/Procedures: Non-Cardiac CT scanning for Calcium score, (CAT scanning), is a noninvasive, special x-ray that produces cross-sectional images of the body using x-rays and a computer. CT scans help physicians diagnose and treat medical conditions. For some CT exams, a contrast material is used to enhance visibility in the area of the body being studied. CT scans provide greater clarity and reveal more details than regular x-ray exams.  Your physician has requested that you have an exercise tolerance test. For further information please visit [email or web address]. Please also follow instruction sheet, as given.  Follow-Up: At [facility], you and your health needs are our priority.  As part of our continuing mission to provide you with exceptional heart care, we have created designated Provider Care Teams.  These Care Teams include your primary Cardiologist (physician) and Advanced Practice Providers (APPs -  Physician Assistants and Nurse Practitioners) who all work together to provide you with the care you need, when you need it. Your physician wants you to follow-up in: 1 year with Dr. Eden Drake. You will receive a reminder letter in the mail two months in advance. If you don't receive a letter, please call our office to schedule the follow-up appointment.

## 2018-10-03 ENCOUNTER — Ambulatory Visit (INDEPENDENT_AMBULATORY_CARE_PROVIDER_SITE_OTHER)
Admission: RE | Admit: 2018-10-03 | Discharge: 2018-10-03 | Disposition: A | Discharge status: Home / Self Care | Payer: Self-pay | Source: Ambulatory Visit | Attending: Cardiovascular Disease | Admitting: Cardiovascular Disease

## 2018-10-03 ENCOUNTER — Ambulatory Visit (INDEPENDENT_AMBULATORY_CARE_PROVIDER_SITE_OTHER): Payer: Medicare Other

## 2018-10-03 DIAGNOSIS — Z8249 Family history of ischemic heart disease and other diseases of the circulatory system: Secondary | ICD-10-CM

## 2018-10-03 DIAGNOSIS — R079 Chest pain, unspecified: Secondary | ICD-10-CM

## 2018-10-03 DIAGNOSIS — I10 Essential (primary) hypertension: Secondary | ICD-10-CM

## 2018-10-03 DIAGNOSIS — I471 Supraventricular tachycardia, unspecified: Secondary | ICD-10-CM

## 2018-10-04 LAB — EXERCISE TOLERANCE TEST
CHL CUP RESTING HR STRESS: 66 {beats}/min
CSEPEDS: 30 s
CSEPPHR: 148 {beats}/min
Estimated workload: 9.3 METS
Exercise duration (min): 7 min
MPHR: 148 {beats}/min
Percent HR: 100 %
RPE: 15

## 2018-10-06 ENCOUNTER — Telehealth: Payer: Self-pay

## 2018-10-06 ENCOUNTER — Telehealth (HOSPITAL_COMMUNITY): Payer: Self-pay

## 2018-10-06 DIAGNOSIS — R079 Chest pain, unspecified: Secondary | ICD-10-CM

## 2018-10-06 DIAGNOSIS — R9431 Abnormal electrocardiogram [ECG] [EKG]: Secondary | ICD-10-CM

## 2018-10-06 NOTE — Telephone Encounter (Signed)
Patient aware of results. Will have scheduling call patient to make an appointment for exercise myoview. Went over instructions for myoview and will send copy to patient's MyChart.

## 2018-10-06 NOTE — Telephone Encounter (Signed)
-----   Message from Wendall StadePeter C Nishan, MD sent at 10/06/2018  8:46 AM EST ----- Can have f/u exercise myovue

## 2018-10-06 NOTE — Telephone Encounter (Signed)
Pt contacted and given detailed instructions for her stress test. She stated that she would be here. S.Jean Drake EMTP

## 2018-10-07 ENCOUNTER — Ambulatory Visit (HOSPITAL_COMMUNITY): Payer: Medicare Other | Attending: Cardiovascular Disease

## 2018-10-07 DIAGNOSIS — R9431 Abnormal electrocardiogram [ECG] [EKG]: Secondary | ICD-10-CM

## 2018-10-07 DIAGNOSIS — R079 Chest pain, unspecified: Secondary | ICD-10-CM

## 2018-10-07 LAB — MYOCARDIAL PERFUSION IMAGING
CHL CUP MPHR: 148 {beats}/min
CHL CUP NUCLEAR SDS: 4
CHL CUP NUCLEAR SRS: 0
CHL CUP NUCLEAR SSS: 4
CHL CUP RESTING HR STRESS: 68 {beats}/min
CSEPEW: 7 METS
CSEPHR: 91 %
Exercise duration (min): 6 min
LV dias vol: 47 mL (ref 46–106)
LV sys vol: 12 mL
Peak HR: 136 {beats}/min
RPE: 18
TID: 0.76

## 2018-10-07 MED ORDER — TECHNETIUM TC 99M TETROFOSMIN IV KIT
32.0000 | PACK | Freq: Once | INTRAVENOUS | Status: AC | PRN
  Administered 2018-10-07: 32 via INTRAVENOUS
  Filled 2018-10-07: qty 32

## 2018-10-07 MED ORDER — TECHNETIUM TC 99M TETROFOSMIN IV KIT
10.4000 | PACK | Freq: Once | INTRAVENOUS | Status: AC | PRN
  Administered 2018-10-07: 10.4 via INTRAVENOUS
  Filled 2018-10-07: qty 11

## 2018-10-08 ENCOUNTER — Encounter: Payer: Self-pay | Admitting: Internal Medicine

## 2018-10-21 DIAGNOSIS — H04123 Dry eye syndrome of bilateral lacrimal glands: Secondary | ICD-10-CM | POA: Diagnosis not present

## 2018-10-21 DIAGNOSIS — H25813 Combined forms of age-related cataract, bilateral: Secondary | ICD-10-CM | POA: Diagnosis not present

## 2018-11-11 ENCOUNTER — Other Ambulatory Visit: Payer: Self-pay | Admitting: Adult Health

## 2018-11-24 ENCOUNTER — Ambulatory Visit: Payer: Medicare Other | Admitting: Adult Health

## 2018-11-24 ENCOUNTER — Encounter: Payer: Self-pay | Admitting: Adult Health

## 2018-11-24 VITALS — BP 126/60 | HR 80 | Temp 97.8°F | Resp 16 | Ht 61.5 in | Wt 145.8 lb

## 2018-11-24 DIAGNOSIS — B9789 Other viral agents as the cause of diseases classified elsewhere: Secondary | ICD-10-CM | POA: Diagnosis not present

## 2018-11-24 DIAGNOSIS — J069 Acute upper respiratory infection, unspecified: Secondary | ICD-10-CM | POA: Diagnosis not present

## 2018-11-24 MED ORDER — ALPRAZOLAM 0.5 MG PO TABS: 0.5000 mg | ORAL_TABLET | Freq: Two times a day (BID) | ORAL | 0 refills | Status: DC | PRN

## 2018-11-24 MED ORDER — PREDNISONE 20 MG PO TABS: ORAL_TABLET | ORAL | 0 refills | Status: DC

## 2018-11-24 MED ORDER — PROMETHAZINE-DM 6.25-15 MG/5ML PO SYRP: 5.0000 mL | ORAL_SOLUTION | Freq: Four times a day (QID) | ORAL | 1 refills | Status: DC | PRN

## 2018-11-24 MED ORDER — AZITHROMYCIN 250 MG PO TABS: ORAL_TABLET | ORAL | 1 refills | Status: AC

## 2018-11-24 NOTE — Patient Instructions (Signed)
Take 25-50 mg of benadryl at night if needed for sleep at night  Fill the zpak on Thursday if not any better   Push hydration  HOW TO TREAT VIRAL COUGH AND COLD SYMPTOMS:  -Symptoms usually last at least 1 week with the worst symptoms being around day 4.  - colds usually start with a sore throat and end with a cough, and the cough can take 2 weeks to get better.  -No antibiotics are needed for colds, flu, sore throats, cough, bronchitis UNLESS symptoms are longer than 7 days OR if you are getting better then get drastically worse.  -There are a lot of combination medications (Dayquil, Nyquil, Vicks 44, tyelnol cold and sinus, ETC). Please look at the ingredients on the back so that you are treating the correct symptoms and not doubling up on medications/ingredients.    Medicines you can use  Nasal congestion  Little Remedies saline spray (aerosol/mist)- can try this, it is in the kids section - pseudoephedrine (Sudafed)- behind the counter, do not use if you have high blood pressure, medicine that have -D in them.  - phenylephrine (Sudafed PE) -Dextormethorphan + chlorpheniramine (Coridcidin HBP)- okay if you have high blood pressure -Oxymetazoline (Afrin) nasal spray- LIMIT to 3 days -Saline nasal spray -Neti pot (used distilled or bottled water)  Ear pain/congestion  -pseudoephedrine (sudafed) - Nasonex/flonase nasal spray  Fever  -Acetaminophen (Tyelnol) -Ibuprofen (Advil, motrin, aleve)  Sore Throat  -Acetaminophen (Tyelnol) -Ibuprofen (Advil, motrin, aleve) -Drink a lot of water -Gargle with salt water - Rest your voice (don't talk) -Throat sprays -Cough drops  Body Aches  -Acetaminophen (Tyelnol) -Ibuprofen (Advil, motrin, aleve)  Headache  -Acetaminophen (Tyelnol) -Ibuprofen (Advil, motrin, aleve) - Exedrin, Exedrin Migraine  Allergy symptoms (cough, sneeze, runny nose, itchy eyes) -Claritin or loratadine cheapest but likely the weakest  -Zyrtec or certizine  at night because it can make you sleepy -The strongest is allegra or fexafinadine  Cheapest at [employer], [employer], [employer]  Cough  -Dextromethorphan (Delsym)- medicine that has DM in it -Guafenesin (Mucinex/Robitussin) - cough drops - drink lots of water  Chest Congestion  -Guafenesin (Mucinex/Robitussin)  Red Itchy Eyes  - Naphcon-A  Upset Stomach  - Bland diet (nothing spicy, greasy, fried, and high acid foods like tomatoes, oranges, berries) -OKAY- cereal, bread, soup, crackers, rice -Eat smaller more frequent meals -reduce caffeine, no alcohol -Loperamide (Imodium-AD) if diarrhea -Prevacid for heart burn  General health when sick  -Hydration -wash your hands frequently -keep surfaces clean -change pillow cases and sheets often -Get fresh air but do not exercise strenuously -Vitamin D, double up on it - Vitamin C -Zinc

## 2018-11-24 NOTE — Progress Notes (Signed)
Assessment and Plan:  Jean Drake was seen today for cough.  Diagnoses and all orders for this visit:  Viral URI with cough Benign exam other than cough in office, low grade fever, Most likely viral, Discussed the importance of avoiding unnecessary antibiotic therapy. Expect self limited duration of 5-7 days.  Suggested symptomatic OTC remedies, hydration, immune support discussed Nasal saline spray for congestion. Nasal steroids, allergy pill, oral steroids offered Follow up as needed. Fill zpak and take if progressive productive cough, fever worsening, or symptoms not improving suggesting superimposed bacterial infection -     predniSONE (DELTASONE) 20 MG tablet; 2 tablets daily for 3 days, 1 tablet daily for 4 days. -     promethazine-dextromethorphan (PROMETHAZINE-DM) 6.25-15 MG/5ML syrup; Take 5 mLs by mouth 4 (four) times daily as needed for cough.  Other orders -     ALPRAZolam (XANAX) 0.5 MG tablet; Take 1 tablet (0.5 mg total) by mouth 2 (two) times daily as needed. for anxiety -     azithromycin (ZITHROMAX) 250 MG tablet; Take 2 tablets (500 mg) on  Day 1,  followed by 1 tablet (250 mg) once daily on Days 2 through 5.   Further disposition pending results of labs. Discussed med's effects and SE's.   Over 15 minutes of exam, counseling, chart review, and critical decision making was performed.   Future Appointments  Date Time Provider Department Center  12/30/2018 11:30 AM Quentin Mullingollier, Amanda, PA-C [facility] None  04/02/2019 11:30 AM Lucky CowboyMcKeown, William, MD [facility] None  06/24/2019  2:00 PM Judd Gaudierorbett, Tagg Eustice, NP [facility] None  10/28/2019  3:00 PM Lucky CowboyMcKeown, William, MD [facility] None    ------------------------------------------------------------------------------------------------------------------   HPI BP 126/60   Pulse 80   Temp 97.8 F (36.6 C)   Resp 16   Ht 5' 1.5" (1.562 m)   Wt 145 lb 12.8 oz (66.1 kg)   BMI 27.10 kg/m   73 y.o.female presents for evaluation of URI  symptoms with mildly productive cough and running nose that began in the evening 2 days ago. She also reports low grade fever at home, 99.2 this AM. She reports dull headache, nasal/sinus congestion, facial pressure. Has taken tylenol 500 mg, x 1-2 tabs PRN, mucinex, steroid nasal spray. She has been taking old prescription cough syrup from 2005 but unsure this really helped. She also reports mild queasiness.    She did not have the flu vaccine this season. No recent known sick contacts. Husband is not ill. She does have hx of allergies, takes allegra daily.   Past Medical History:  Diagnosis Date  . Essential hypertension 10/20/2013  . 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 GERD 10/20/2013  . Hyperlipidemia 10/20/2013  . Migraine, unspecified, without mention of intractable migraine without mention of status migrainosus 10/20/2013  . Morbid obesity (HCC) 11/18/2014  . Prediabetes 10/20/2013  . SVT (supraventricular tachycardia) (HCC) 11/15/2015  . Vitamin D deficiency 10/20/2013     Allergies  Allergen Reactions  . Penicillins Anaphylaxis  . Codeine Nausea And Vomiting  . Sudafed [Pseudoephedrine Hcl]     Makes heart race, per pt     Current Outpatient Medications on File Prior to Visit  Medication Sig  . aspirin 81 MG tablet Take 81 mg by mouth daily.  Marland Kitchen. atenolol (TENORMIN) 50 MG tablet TAKE ONE TABLET BY MOUTH DAILY  . Cholecalciferol (VITAMIN D) 2000 UNITS tablet Take 4,000 Units by mouth daily.   . fexofenadine (ALLEGRA) 180 MG tablet Take 180 mg by mouth daily.  Marland Kitchen. MAGNESIUM OXIDE PO  Take 250 mg by mouth daily.  . Melatonin 3 MG TABS Take by mouth.  . Multiple Vitamin (MULTIVITAMIN) tablet Take 1 tablet by mouth daily.  . multivitamin-lutein (OCUVITE-LUTEIN) CAPS capsule Take 1 capsule by mouth daily.  . Omega-3 Fatty Acids (FISH OIL) 1000 MG CAPS Take 1 capsule by mouth daily.  Marland Kitchen OVER THE COUNTER MEDICATION Takes Vitamin B Complex 1 daily  . ranitidine (ZANTAC) 75 MG tablet Take 75 mg by  mouth 2 (two) times daily.   No current facility-administered medications on file prior to visit.     ROS: all negative except above.   Physical Exam:  BP 126/60   Pulse 80   Temp 97.8 F (36.6 C)   Resp 16   Ht 5' 1.5" (1.562 m)   Wt 145 lb 12.8 oz (66.1 kg)   BMI 27.10 kg/m   General Appearance: Well nourished, in no apparent distress. Eyes: PERRLA, EOMs, conjunctiva no swelling or erythema Sinuses: No Frontal/maxillary tenderness ENT/Mouth: Ext aud canals clear, TMs without erythema, bulging. No erythema, swelling, or exudate on post pharynx.  Tonsils not swollen or erythematous. Hearing normal.  Neck: Supple, thyroid normal.  Respiratory: Respiratory effort normal, BS equal bilaterally without rales, rhonchi, wheezing or stridor. Occasional hacky/dry sounding cough.  Cardio: RRR with no MRGs. Brisk peripheral pulses without edema.  Abdomen: Soft, + BS.  Non tender. Lymphatics: Non tender without lymphadenopathy.  Musculoskeletal: normal gait.  Skin: Warm, dry without rashes, lesions, ecchymosis.  Neuro: Cranial nerves intact. Normal muscle tone Psych: Awake and oriented X 3, normal affect, Insight and Judgment appropriate.     Dan Maker, NP 2:13 PM [facility]

## 2018-12-29 NOTE — Progress Notes (Signed)
FOLLOW UP  Assessment and Plan:   Essential hypertension - continue medications, DASH diet, exercise and monitor at home. Call if greater than 130/80.  -     CBC with Differential/Platelet -     COMPLETE METABOLIC PANEL WITH GFR -     TSH  SVT (supraventricular tachycardia) (HCC)  continue atenolol  CKD (chronic kidney disease) stage 3, GFR 30-59 ml/min (HCC) -     COMPLETE METABOLIC PANEL WITH GFR - continue to increase water, now stage 2 last checked  Hyperlipidemia, mixed check lipids decrease fatty foods increase activity.  -     Lipid panel  Overweight (BMI 25.0-29.9)  Overweight  - long discussion about weight loss, diet, and exercise -recommended diet heavy in fruits and veggies and low in animal meats, cheeses, and dairy products  Primary insomnia Only takes xanax AS needed Continue melatonin If needs xanax more often will contact us to discuss other options   Continue diet and meds as discussed. Further disposition pending results of labs. Discussed med's effects and SE's.   Over 30 minutes of exam, counseling, chart review, and critical decision making was performed.   Future Appointments  Date Time Provider Department Center  04/02/2019 11:30 AM Lucky Cowboy, MD [facility] None  06/24/2019  2:00 PM Judd Gaudier, NP [facility] None  10/28/2019  3:00 PM Lucky Cowboy, MD [facility] None    ----------------------------------------------------------------------------------------------------------------------  HPI 73 y.o. female  presents for 3 month follow up on hypertension, cholesterol, abnormal glucose (history of prediabetes), weight and vitamin D deficiency.   She also has a hx of SVT; will do atenolol or valsalva that helps. Normal stress test 10/07/2018.  She has hx of insomnia -will take xanax 1/2, 2-3 times a week as needed for sleep.   BMI is Body mass index is 27.73 kg/m., she has been working on diet and exercise. Wt Readings from  Last 3 Encounters:  12/30/18 149 lb 3.2 oz (67.7 kg)  11/24/18 145 lb 12.8 oz (66.1 kg)  10/07/18 145 lb (65.8 kg)   Her blood pressure has been controlled at home, today their BP is BP: 140/72  She does workout. She denies chest pain, shortness of breath, dizziness.   She is not on cholesterol medication (takes omega 3 supplement) and denies myalgias. Her cholesterol is not at goal. The cholesterol last visit was:   Lab Results  Component Value Date   CHOL 208 (H) 09/22/2018   HDL 73 09/22/2018   LDLCALC 114 (H) 09/22/2018   TRIG 106 09/22/2018   CHOLHDL 2.8 09/22/2018    She has been working on diet and exercise for prediabetes, and denies increased appetite, nausea, paresthesia of the feet, polydipsia, polyuria, visual disturbances and vomiting. Last A1C in the office was:  Lab Results  Component Value Date   HGBA1C 5.5 09/22/2018    Lab Results  Component Value Date   GFRNONAA 74 09/22/2018    Patient is on Vitamin D supplement and near goal at recent check:    Lab Results  Component Value Date   VD25OH 61 09/22/2018       Current Medications:  Current Outpatient Medications on File Prior to Visit  Medication Sig  . ALPRAZolam (XANAX) 0.5 MG tablet Take 1 tablet (0.5 mg total) by mouth 2 (two) times daily as needed. for anxiety  . aspirin 81 MG tablet Take 81 mg by mouth daily.  Marland Kitchen atenolol (TENORMIN) 50 MG tablet TAKE ONE TABLET BY MOUTH DAILY  . Cholecalciferol (VITAMIN D)  2000 UNITS tablet Take 4,000 Units by mouth daily.   . fexofenadine (ALLEGRA) 180 MG tablet Take 180 mg by mouth daily.  Marland Kitchen MAGNESIUM OXIDE PO Take 250 mg by mouth daily.  . Melatonin 3 MG TABS Take by mouth.  . Multiple Vitamin (MULTIVITAMIN) tablet Take 1 tablet by mouth daily.  . multivitamin-lutein (OCUVITE-LUTEIN) CAPS capsule Take 1 capsule by mouth daily.  . Omega-3 Fatty Acids (FISH OIL) 1000 MG CAPS Take 1 capsule by mouth daily.  Marland Kitchen OVER THE COUNTER MEDICATION Takes Vitamin B Complex 1  daily   No current facility-administered medications on file prior to visit.      Allergies:  Allergies  Allergen Reactions  . Penicillins Anaphylaxis  . Codeine Nausea And Vomiting  . Sudafed [Pseudoephedrine Hcl]     Makes heart race, per pt      Medical History:  Past Medical History:  Diagnosis Date  . Essential hypertension 10/20/2013  . 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 GERD 10/20/2013  . Hyperlipidemia 10/20/2013  . Migraine, unspecified, without mention of intractable migraine without mention of status migrainosus 10/20/2013  . Morbid obesity (HCC) 11/18/2014  . Prediabetes 10/20/2013  . SVT (supraventricular tachycardia) (HCC) 11/15/2015  . Vitamin D deficiency 10/20/2013   Family history- Reviewed and unchanged Social history- Reviewed and unchanged   Review of Systems:  Review of Systems  Constitutional: Negative for malaise/fatigue and weight loss.  HENT: Negative for hearing loss and tinnitus.   Eyes: Negative for blurred vision and double vision.  Respiratory: Negative for cough, shortness of breath and wheezing.   Cardiovascular: Negative for chest pain, palpitations, orthopnea, claudication and leg swelling.  Gastrointestinal: Negative for abdominal pain, blood in stool, constipation, diarrhea, heartburn, melena, nausea and vomiting.  Genitourinary: Negative.   Musculoskeletal: Negative for joint pain and myalgias.  Skin: Negative for rash.  Neurological: Negative for dizziness, tingling, sensory change, weakness and headaches.  Endo/Heme/Allergies: Negative for polydipsia.  Psychiatric/Behavioral: Negative.   All other systems reviewed and are negative.     Physical Exam: BP 140/72   Pulse 64   Temp 97.7 F (36.5 C)   Ht 5' 1.5" (1.562 m)   Wt 149 lb 3.2 oz (67.7 kg)   SpO2 95%   BMI 27.73 kg/m  Wt Readings from Last 3 Encounters:  12/30/18 149 lb 3.2 oz (67.7 kg)  11/24/18 145 lb 12.8 oz (66.1 kg)  10/07/18 145 lb (65.8 kg)   General Appearance:  Well nourished, in no apparent distress. Eyes: PERRLA, EOMs, conjunctiva no swelling or erythema Sinuses: No Frontal/maxillary tenderness ENT/Mouth: Ext aud canals clear, TMs without erythema, bulging. No erythema, swelling, or exudate on post pharynx.  Tonsils not swollen or erythematous. Hearing normal.  Neck: Supple, thyroid normal.  Respiratory: Respiratory effort normal, BS equal bilaterally without rales, rhonchi, wheezing or stridor.  Cardio: RRR with no MRGs. Brisk peripheral pulses without edema.  Abdomen: Soft, + BS.  Non tender, no guarding, rebound, hernias, masses. Lymphatics: Non tender without lymphadenopathy.  Musculoskeletal: Full ROM, 5/5 strength, Normal gait Skin: Warm, dry without rashes, lesions, ecchymosis.  Neuro: Cranial nerves intact. No cerebellar symptoms.  Psych: Awake and oriented X 3, normal affect, Insight and Judgment appropriate.    Quentin Mulling, PA-C 11:46 AM [facility]

## 2018-12-30 ENCOUNTER — Ambulatory Visit (INDEPENDENT_AMBULATORY_CARE_PROVIDER_SITE_OTHER): Payer: Medicare Other | Admitting: Physician Assistant

## 2018-12-30 ENCOUNTER — Encounter: Payer: Self-pay | Admitting: Physician Assistant

## 2018-12-30 VITALS — BP 140/72 | HR 64 | Temp 97.7°F | Ht 61.5 in | Wt 149.2 lb

## 2018-12-30 DIAGNOSIS — E663 Overweight: Secondary | ICD-10-CM

## 2018-12-30 DIAGNOSIS — N183 Chronic kidney disease, stage 3 unspecified: Secondary | ICD-10-CM

## 2018-12-30 DIAGNOSIS — E782 Mixed hyperlipidemia: Secondary | ICD-10-CM

## 2018-12-30 DIAGNOSIS — I10 Essential (primary) hypertension: Secondary | ICD-10-CM | POA: Diagnosis not present

## 2018-12-30 DIAGNOSIS — I471 Supraventricular tachycardia: Secondary | ICD-10-CM

## 2018-12-30 DIAGNOSIS — F5101 Primary insomnia: Secondary | ICD-10-CM

## 2018-12-30 LAB — COMPLETE METABOLIC PANEL WITH GFR
AG Ratio: 1.7 (calc) (ref 1.0–2.5)
ALT: 20 U/L (ref 6–29)
AST: 27 U/L (ref 10–35)
Albumin: 4.4 g/dL (ref 3.6–5.1)
Alkaline phosphatase (APISO): 90 U/L (ref 37–153)
BUN: 14 mg/dL (ref 7–25)
CALCIUM: 9.8 mg/dL (ref 8.6–10.4)
CO2: 27 mmol/L (ref 20–32)
Chloride: 103 mmol/L (ref 98–110)
Creat: 0.92 mg/dL (ref 0.60–0.93)
GFR, Est African American: 72 mL/min/{1.73_m2} (ref >=60)
GFR, Est Non African American: 62 mL/min/{1.73_m2} (ref >=60)
Globulin: 2.6 g/dL (calc) (ref 1.9–3.7)
Glucose, Bld: 86 mg/dL (ref 65–99)
Potassium: 4.1 mmol/L (ref 3.5–5.3)
Sodium: 138 mmol/L (ref 135–146)
Total Bilirubin: 0.5 mg/dL (ref 0.2–1.2)
Total Protein: 7 g/dL (ref 6.1–8.1)

## 2018-12-30 LAB — CBC WITH DIFFERENTIAL/PLATELET
Absolute Monocytes: 613 cells/uL (ref 200–950)
BASOS PCT: 1 %
Basophils Absolute: 73 cells/uL (ref 0–200)
Eosinophils Absolute: 80 cells/uL (ref 15–500)
Eosinophils Relative: 1.1 %
HCT: 42.4 % (ref 35.0–45.0)
Hemoglobin: 14.5 g/dL (ref 11.7–15.5)
Lymphs Abs: 1059 cells/uL (ref 850–3900)
MCH: 30 pg (ref 27.0–33.0)
MCHC: 34.2 g/dL (ref 32.0–36.0)
MCV: 87.6 fL (ref 80.0–100.0)
MPV: 9.8 fL (ref 7.5–12.5)
Monocytes Relative: 8.4 %
Neutro Abs: 5475 cells/uL (ref 1500–7800)
Neutrophils Relative %: 75 %
Platelets: 324 10*3/uL (ref 140–400)
RBC: 4.84 10*6/uL (ref 3.80–5.10)
RDW: 12.5 % (ref 11.0–15.0)
Total Lymphocyte: 14.5 %
WBC: 7.3 10*3/uL (ref 3.8–10.8)

## 2018-12-30 LAB — LIPID PANEL
CHOL/HDL RATIO: 3 (calc) (ref <5.0)
Cholesterol: 207 mg/dL — ABNORMAL HIGH (ref <200)
HDL: 69 mg/dL (ref >=50)
LDL Cholesterol (Calc): 115 mg/dL (calc) — ABNORMAL HIGH
Non-HDL Cholesterol (Calc): 138 mg/dL (calc) — ABNORMAL HIGH (ref <130)
Triglycerides: 115 mg/dL (ref <150)

## 2018-12-30 LAB — TSH: TSH: 1.22 mIU/L (ref 0.40–4.50)

## 2018-12-30 NOTE — Patient Instructions (Signed)
NEW GUIDELINES FOR BENOZOS  New guidelines suggest the benzodiazepines like the XANAX are best short term, with prolonged use they lead to physical and psychological dependence. In addition, evidence suggest that for insomnia the effectiveness wanes in 4 weeks and the risks out weight their benefits. Use of these agents have been associated with dementia, falls, motor vehicle accidents and physical addiction. Decreasing these medication have been proven to show improvements in cognition, alertness, decrease of falls and daytime sedation.   We will start a slow taper, symptoms of withdrawal include, insomnia, anxiety, irritability, sweating and stomach or intestinal symptoms like diarrhea or nausea.    Try the melatonin 5mg -20mg  dissolvable or gummy 30 mins before bed Try to not drink fluids 2 hours before bed  11 Tips to Follow:  1. No caffeine after 3pm: Avoid beverages with caffeine (soda, tea, energy drinks, etc.) especially after 3pm. 2. Don't go to bed hungry: Have your evening meal at least 3 hrs. before going to sleep. It's fine to have a small bedtime snack such as a glass of milk and a few crackers but don't have a big meal. 3. Have a nightly routine before bed: Plan on "winding down" before you go to sleep. Begin relaxing about 1 hour before you go to bed. Try doing a quiet activity such as listening to calming music, reading a book or meditating. 4. Turn off the TV and ALL electronics including video games, tablets, laptops, etc. 1 hour before sleep, and keep them out of the bedroom. 5. Turn off your cell phone and all notifications (new email and text alerts) or even better, leave your phone outside your room while you sleep. Studies have shown that a part of your brain continues to respond to certain lights and sounds even while you're still asleep. 6. Make your bedroom quiet, dark and cool. If you can't control the noise, try wearing earplugs or using a fan to block out other  sounds. 7. Practice relaxation techniques. Try reading a book or meditating or drain your brain by writing a list of what you need to do the next day. 8. Don't nap unless you feel sick: you'll have a better night's sleep. 9. Don't smoke, or quit if you do. Nicotine, alcohol, and marijuana can all keep you awake. Talk to your health care provider if you need help with substance use. 10. Most importantly, wake up at the same time every day (or within 1 hour of your usual wake up time) EVEN on the weekends. A regular wake up time promotes sleep hygiene and prevents sleep problems. 11. Reduce exposure to bright light in the last three hours of the day before going to sleep. Maintaining good sleep hygiene and having good sleep habits lower your risk of developing sleep problems. Getting better sleep can also improve your concentration and alertness. Try the simple steps in this guide. If you still have trouble getting enough rest, make an appointment with your health care provider.

## 2019-01-05 DIAGNOSIS — Z0120 Encounter for dental examination and cleaning without abnormal findings: Secondary | ICD-10-CM | POA: Diagnosis not present

## 2019-04-01 ENCOUNTER — Encounter: Payer: Self-pay | Admitting: Internal Medicine

## 2019-04-01 NOTE — Patient Instructions (Signed)

## 2019-04-01 NOTE — Progress Notes (Signed)
History of Present Illness:      This very nice 73 y.o. MWF presents for 6 month follow up with HTN, HLD, Pre-Diabetes and Vitamin D Deficiency.       Patient is treated for HTN & BP has been controlled at home. Today's BP is at goal - 122/76. Patient has had no complaints of any cardiac type chest pain, palpitations, dyspnea / orthopnea / PND, dizziness, claudication, or dependent edema.      Hyperlipidemia is controlled with diet & meds. Patient denies myalgias or other med SE's. Current Lipids are not at goal & new Rx for Rosuvastatin sent in:  Lab Results  Component Value Date   CHOL 213 (H) 04/02/2019   HDL 65 04/02/2019   LDLCALC 121 (H) 04/02/2019   TRIG 156 (H) 04/02/2019   CHOLHDL 3.3 04/02/2019       Also, the patient has history of PreDiabetes and has had no symptoms of reactive hypoglycemia, diabetic polys, paresthesias or visual blurring.  Last A1c was Normal & at goal: Lab Results  Component Value Date   HGBA1C 5.4 04/02/2019      Further, the patient also has history of Vitamin D Deficiency ("9" / 2009) and supplements vitamin D without any suspected side-effects. Last vitamin D was at goal: Lab Results  Component Value Date   VD25OH 60 04/02/2019   Current Outpatient Medications on File Prior to Visit  Medication Sig  . ALPRAZolam (XANAX) 0.5 MG tablet Take 1 tablet (0.5 mg total) by mouth 2 (two) times daily as needed. for anxiety  . Ascorbic Acid (VITAMIN C PO) Take by mouth. Takes 1 tablet 2 to 3 days a week.  Marland Kitchen aspirin 81 MG tablet Take 81 mg by mouth daily.  Marland Kitchen atenolol (TENORMIN) 50 MG tablet TAKE ONE TABLET BY MOUTH DAILY  . Cholecalciferol (VITAMIN D) 2000 UNITS tablet Take 4,000 Units by mouth daily.   . fexofenadine (ALLEGRA) 180 MG tablet Take 180 mg by mouth daily.  Marland Kitchen MAGNESIUM OXIDE PO Take 250 mg by mouth daily.  . Melatonin 3 MG TABS Take by mouth.  . Multiple Vitamin (MULTIVITAMIN) tablet Take 1 tablet by mouth daily.  . multivitamin-lutein  (OCUVITE-LUTEIN) CAPS capsule Take 1 capsule by mouth daily.  . Omega-3 Fatty Acids (FISH OIL) 1000 MG CAPS Take 1 capsule by mouth daily.  Marland Kitchen OVER THE COUNTER MEDICATION Takes Vitamin B Complex 1 daily   No current facility-administered medications on file prior to visit.    Allergies  Allergen Reactions  . Penicillins Anaphylaxis  . Codeine Nausea And Vomiting  . Sudafed [Pseudoephedrine Hcl]     Makes heart race, per pt    PMHx:   Past Medical History:  Diagnosis Date  . Essential hypertension 10/20/2013  . 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 GERD 10/20/2013  . Hyperlipidemia 10/20/2013  . Migraine, unspecified, without mention of intractable migraine without mention of status migrainosus 10/20/2013  . Morbid obesity (HCC) 11/18/2014  . Prediabetes 10/20/2013  . SVT (supraventricular tachycardia) (HCC) 11/15/2015  . Vitamin D deficiency 10/20/2013   Immunization History  Administered Date(s) Administered  . DTaP 09/12/2013  . Influenza Split 09/12/2013  . Influenza, High Dose Seasonal PF 10/04/2014, 08/30/2017  . Pneumococcal Conjugate-13 10/04/2014  . Pneumococcal Polysaccharide-23 03/29/2009  . Td 12/20/2003  . Tdap 09/12/2013  . Zoster 06/07/2010   Past Surgical History:  Procedure Laterality Date  . NO PAST SURGERIES     FHx:    Reviewed / unchanged  SHx:  Reviewed / unchanged   Systems Review:  Constitutional: Denies fever, chills, wt changes, headaches, insomnia, fatigue, night sweats, change in appetite. Eyes: Denies redness, blurred vision, diplopia, discharge, itchy, watery eyes.  ENT: Denies discharge, congestion, post nasal drip, epistaxis, sore throat, earache, hearing loss, dental pain, tinnitus, vertigo, sinus pain, snoring.  CV: Denies chest pain, palpitations, irregular heartbeat, syncope, dyspnea, diaphoresis, orthopnea, PND, claudication or edema. Respiratory: denies cough, dyspnea, DOE, pleurisy, hoarseness, laryngitis, wheezing.  Gastrointestinal: Denies  dysphagia, odynophagia, heartburn, reflux, water brash, abdominal pain or cramps, nausea, vomiting, bloating, diarrhea, constipation, hematemesis, melena, hematochezia  or hemorrhoids. Genitourinary: Denies dysuria, frequency, urgency, nocturia, hesitancy, discharge, hematuria or flank pain. Musculoskeletal: Denies arthralgias, myalgias, stiffness, jt. swelling, pain, limping or strain/sprain.  Skin: Denies pruritus, rash, hives, warts, acne, eczema or change in skin lesion(s). Neuro: No weakness, tremor, incoordination, spasms, paresthesia or pain. Psychiatric: Denies confusion, memory loss or sensory loss. Endo: Denies change in weight, skin or hair change.  Heme/Lymph: No excessive bleeding, bruising or enlarged lymph nodes.  Physical Exam  BP 122/76   Pulse 60   Temp (!) 97 F (36.1 C)   Resp 16   Ht 5' 1.5" (1.562 m)   Wt 148 lb 12.8 oz (67.5 kg)   BMI 27.66 kg/m   Appears  well nourished, well groomed  and in no distress.  Eyes: PERRLA, EOMs, conjunctiva no swelling or erythema. Sinuses: No frontal/maxillary tenderness ENT/Mouth: EAC's clear, TM's nl w/o erythema, bulging. Nares clear w/o erythema, swelling, exudates. Oropharynx clear without erythema or exudates. Oral hygiene is good. Tongue normal, non obstructing. Hearing intact.  Neck: Supple. Thyroid not palpable. Car 2+/2+ without bruits, nodes or JVD. Chest: Respirations nl with BS clear & equal w/o rales, rhonchi, wheezing or stridor.  Cor: Heart sounds normal w/ regular rate and rhythm without sig. murmurs, gallops, clicks or rubs. Peripheral pulses normal and equal  without edema.  Abdomen: Soft & bowel sounds normal. Non-tender w/o guarding, rebound, hernias, masses or organomegaly.  Lymphatics: Unremarkable.  Musculoskeletal: Full ROM all peripheral extremities, joint stability, 5/5 strength and normal gait.  Skin: Warm, dry without exposed rashes, lesions or ecchymosis apparent.  Neuro: Cranial nerves intact,  reflexes equal bilaterally. Sensory-motor testing grossly intact. Tendon reflexes grossly intact.  Pysch: Alert & oriented x 3.  Insight and judgement nl & appropriate. No ideations.  Assessment and Plan:  1. Essential hypertension  - Continue medication, monitor blood pressure at home.  - Continue DASH diet.  Reminder to go to the ER if any CP,  SOB, nausea, dizziness, severe HA, changes vision/speech.  - CBC with Differential/Platelet - COMPLETE METABOLIC PANEL WITH GFR - Magnesium - TSH  2. Hyperlipidemia, mixed   - Continue diet/meds, exercise,& lifestyle modifications.  - Continue monitor periodic cholesterol/liver & renal functions   - Lipid panel - TSH  3. Abnormal glucose  - Continue diet, exercise  - Lifestyle modifications.  - Monitor appropriate labs.  - Hemoglobin A1c - Insulin, random  4. Vitamin D deficiency  - Continue supplementation.   - VITAMIN D 25 Hydroxyl  5. Prediabetes  - Hemoglobin A1c - Insulin, random  6. Medication management  - CBC with Differential/Platelet - COMPLETE METABOLIC PANEL WITH GFR - Magnesium - Lipid panel - TSH - Hemoglobin A1c - Insulin, random - VITAMIN D 25 Hydroxy         Discussed  regular exercise, BP monitoring, weight control to achieve/maintain BMI less than 25 and discussed med and SE's. Recommended labs to  assess and monitor clinical status with further disposition pending results of labs. I discussed the assessment and treatment plan with the patient. The patient was provided an opportunity to ask questions and all were answered. The patient agreed with the plan and demonstrated an understanding of the instructions. I provided  minutes of non-face-to-face time during this encounter and over 40 minutes of exam, counseling, chart review and  complex critical decision making was performed   Marinus MawWilliam D Rayann Jolley, MD

## 2019-04-02 ENCOUNTER — Ambulatory Visit (INDEPENDENT_AMBULATORY_CARE_PROVIDER_SITE_OTHER): Payer: Medicare Other | Admitting: Internal Medicine

## 2019-04-02 ENCOUNTER — Other Ambulatory Visit: Payer: Self-pay

## 2019-04-02 VITALS — BP 122/76 | HR 60 | Temp 97.0°F | Resp 16 | Ht 61.5 in | Wt 148.8 lb

## 2019-04-02 DIAGNOSIS — E782 Mixed hyperlipidemia: Secondary | ICD-10-CM

## 2019-04-02 DIAGNOSIS — R7303 Prediabetes: Secondary | ICD-10-CM

## 2019-04-02 DIAGNOSIS — I10 Essential (primary) hypertension: Secondary | ICD-10-CM | POA: Diagnosis not present

## 2019-04-02 DIAGNOSIS — E559 Vitamin D deficiency, unspecified: Secondary | ICD-10-CM | POA: Diagnosis not present

## 2019-04-02 DIAGNOSIS — R7309 Other abnormal glucose: Secondary | ICD-10-CM

## 2019-04-02 DIAGNOSIS — Z79899 Other long term (current) drug therapy: Secondary | ICD-10-CM

## 2019-04-03 LAB — CBC WITH DIFFERENTIAL/PLATELET
Absolute Monocytes: 607 cells/uL (ref 200–950)
Basophils Absolute: 59 cells/uL (ref 0–200)
Basophils Relative: 0.9 %
Eosinophils Absolute: 119 cells/uL (ref 15–500)
Eosinophils Relative: 1.8 %
HCT: 42.8 % (ref 35.0–45.0)
Hemoglobin: 14.4 g/dL (ref 11.7–15.5)
Lymphs Abs: 983 cells/uL (ref 850–3900)
MCH: 29.6 pg (ref 27.0–33.0)
MCHC: 33.6 g/dL (ref 32.0–36.0)
MCV: 87.9 fL (ref 80.0–100.0)
MPV: 9.7 fL (ref 7.5–12.5)
Monocytes Relative: 9.2 %
Neutro Abs: 4831 cells/uL (ref 1500–7800)
Neutrophils Relative %: 73.2 %
Platelets: 305 10*3/uL (ref 140–400)
RBC: 4.87 10*6/uL (ref 3.80–5.10)
RDW: 12.5 % (ref 11.0–15.0)
Total Lymphocyte: 14.9 %
WBC: 6.6 10*3/uL (ref 3.8–10.8)

## 2019-04-03 LAB — INSULIN, RANDOM: Insulin: 5.9 u[IU]/mL

## 2019-04-03 LAB — COMPLETE METABOLIC PANEL WITH GFR
AG Ratio: 1.9 (calc) (ref 1.0–2.5)
ALT: 20 U/L (ref 6–29)
AST: 28 U/L (ref 10–35)
Albumin: 4.4 g/dL (ref 3.6–5.1)
Alkaline phosphatase (APISO): 82 U/L (ref 37–153)
BUN: 12 mg/dL (ref 7–25)
CO2: 28 mmol/L (ref 20–32)
Calcium: 10 mg/dL (ref 8.6–10.4)
Chloride: 105 mmol/L (ref 98–110)
Creat: 0.87 mg/dL (ref 0.60–0.93)
GFR, Est African American: 77 mL/min/{1.73_m2} (ref >=60)
GFR, Est Non African American: 67 mL/min/{1.73_m2} (ref >=60)
Globulin: 2.3 g/dL (calc) (ref 1.9–3.7)
Glucose, Bld: 93 mg/dL (ref 65–99)
Potassium: 4.9 mmol/L (ref 3.5–5.3)
Sodium: 139 mmol/L (ref 135–146)
Total Bilirubin: 0.4 mg/dL (ref 0.2–1.2)
Total Protein: 6.7 g/dL (ref 6.1–8.1)

## 2019-04-03 LAB — HEMOGLOBIN A1C
Hgb A1c MFr Bld: 5.4 % of total Hgb (ref <5.7)
Mean Plasma Glucose: 108 (calc)
eAG (mmol/L): 6 (calc)

## 2019-04-03 LAB — VITAMIN D 25 HYDROXY (VIT D DEFICIENCY, FRACTURES): Vit D, 25-Hydroxy: 60 ng/mL (ref 30–100)

## 2019-04-03 LAB — LIPID PANEL
Cholesterol: 213 mg/dL — ABNORMAL HIGH (ref <200)
HDL: 65 mg/dL (ref >=50)
LDL Cholesterol (Calc): 121 mg/dL (calc) — ABNORMAL HIGH
Non-HDL Cholesterol (Calc): 148 mg/dL (calc) — ABNORMAL HIGH (ref <130)
Total CHOL/HDL Ratio: 3.3 (calc) (ref <5.0)
Triglycerides: 156 mg/dL — ABNORMAL HIGH (ref <150)

## 2019-04-03 LAB — MAGNESIUM: Magnesium: 2 mg/dL (ref 1.5–2.5)

## 2019-04-03 LAB — TSH: TSH: 2.43 mIU/L (ref 0.40–4.50)

## 2019-04-04 ENCOUNTER — Other Ambulatory Visit: Payer: Self-pay | Admitting: Internal Medicine

## 2019-04-04 ENCOUNTER — Encounter: Payer: Self-pay | Admitting: Internal Medicine

## 2019-04-04 DIAGNOSIS — E782 Mixed hyperlipidemia: Secondary | ICD-10-CM

## 2019-04-04 MED ORDER — ROSUVASTATIN CALCIUM 10 MG PO TABS: ORAL_TABLET | ORAL | 3 refills | Status: DC

## 2019-04-16 IMAGING — CT CT HEART SCORING
2 series · 16 of 20 positions shown, 18 images · non-contrast
Comparison: None.

Addendum:
EXAM:
OVER-READ INTERPRETATION  CT CHEST

The following report is an over-read performed by radiologist Dr.
Fou Ad Euro [REDACTED] on 10/03/2018. This
over-read does not include interpretation of cardiac or coronary
anatomy or pathology. The coronary calcium score interpretation by
the cardiologist is attached.
CLINICAL DATA: Risk stratification
Coronary Calcium Score
TECHNIQUE: The patient was scanned on a Siemens Somatom 64 slice scanner. Axial
non-contrast 3 mm slices were carried out through the heart. The
data set was analyzed on a dedicated work station and scored using
the Agatson method.

[Series 2: casc 3.0 i36f 2 bestdiast 71 % · axial · 0.36mm/px · z∈[-252,-147]mm · 8 of 47 slices shown, 10 images]
[im 6/47  vessel]
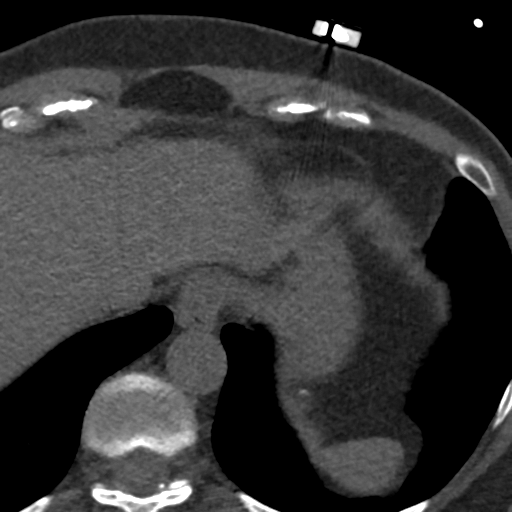
[im 6/47  lung]
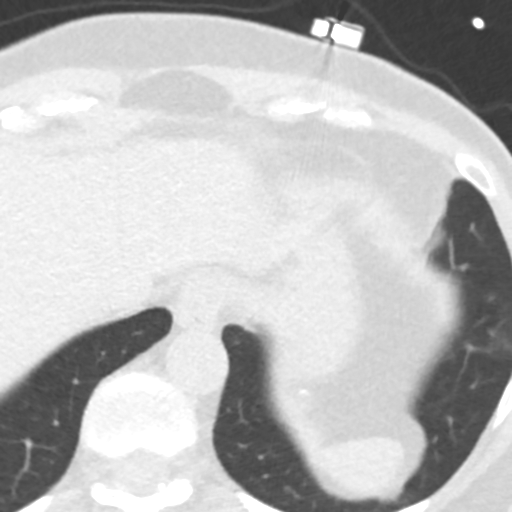
[im 11/47  vessel]
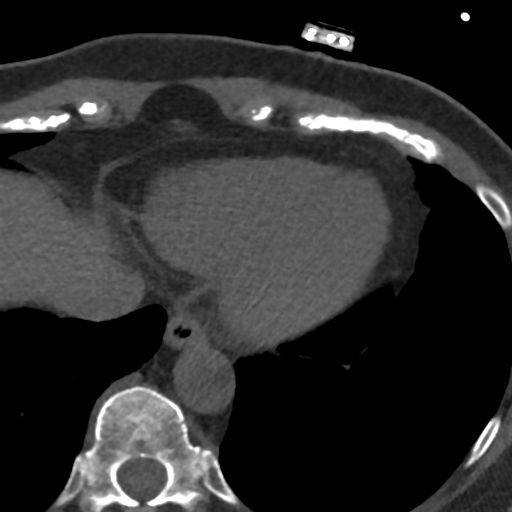
[im 16/47  vessel]
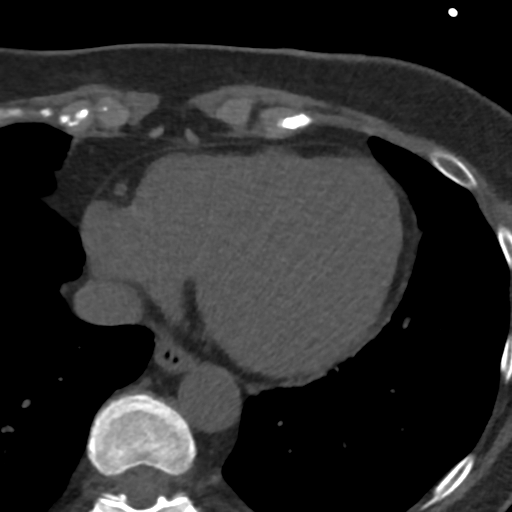
[im 21/47  vessel]
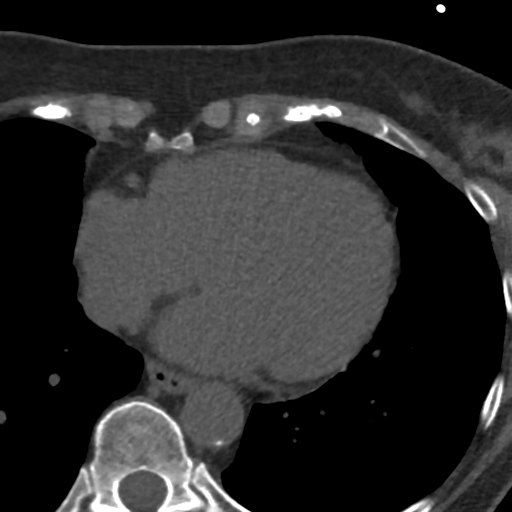
[im 26/47  vessel]
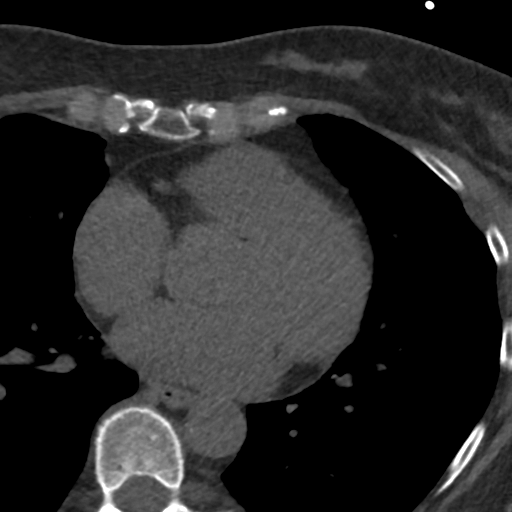
[im 26/47  lung]
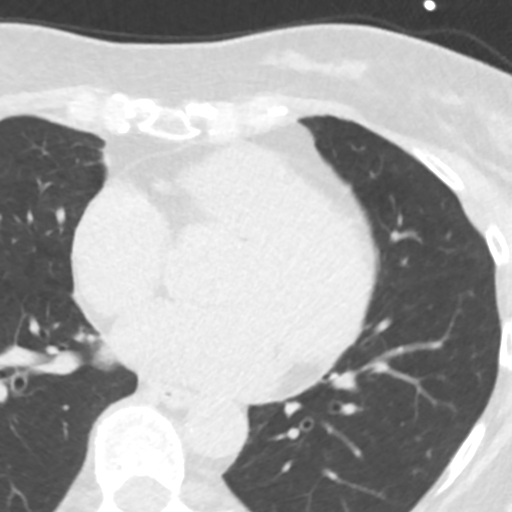
[im 31/47  vessel]
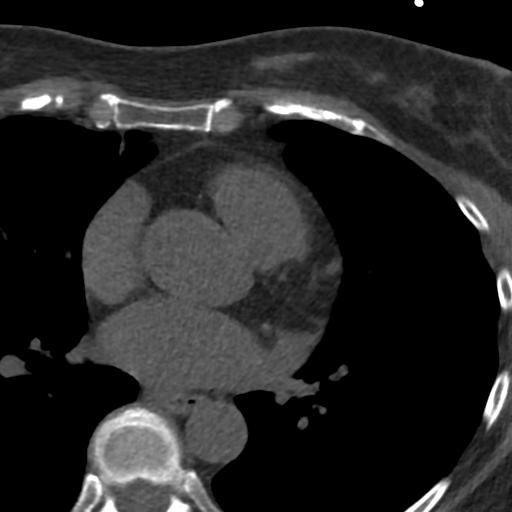
[im 36/47  vessel]
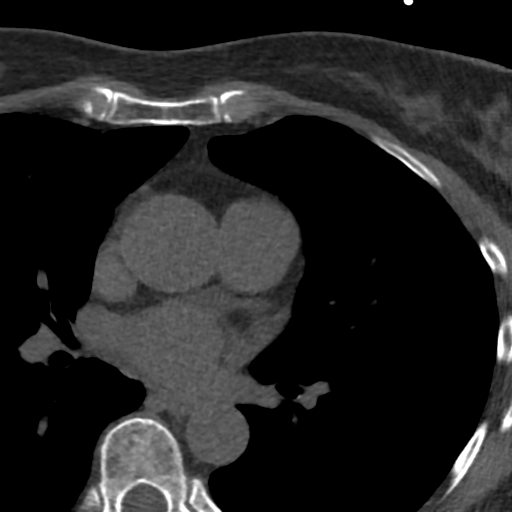
[im 41/47  vessel]
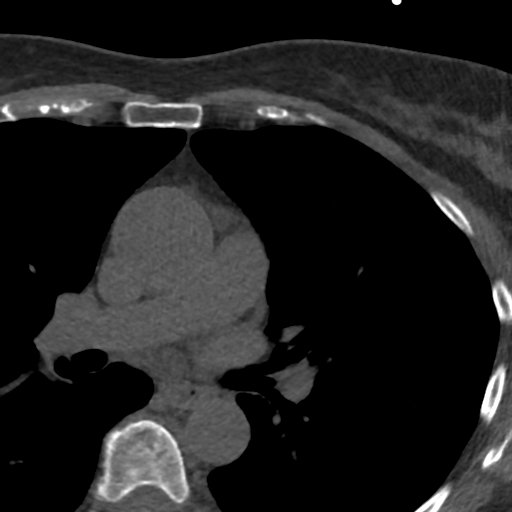

[Series 4: lung st 71 % · axial · 0.62mm/px · z∈[-252,-146]mm · 8 of 47 slices shown]
[im 6/47  lung]
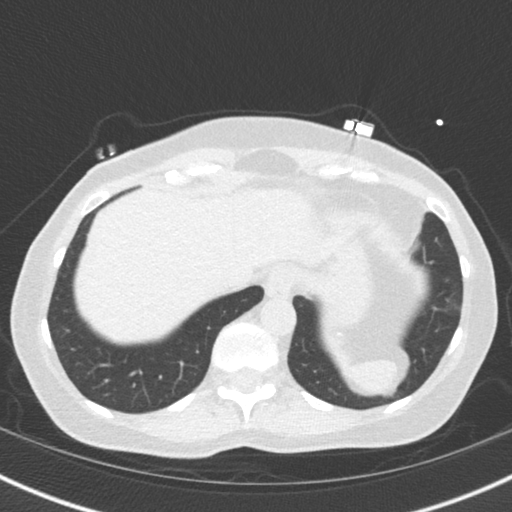
[im 11/47  lung]
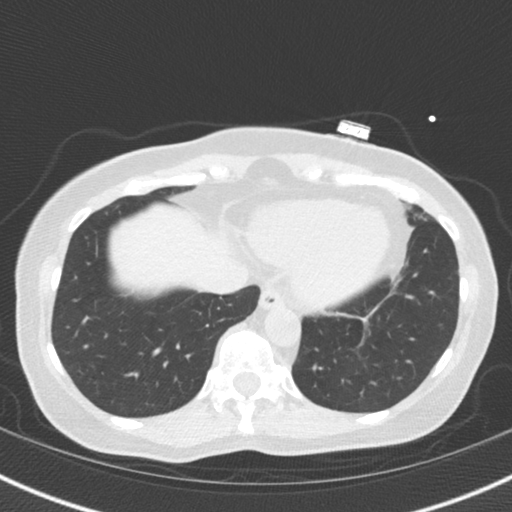
[im 16/47  lung]
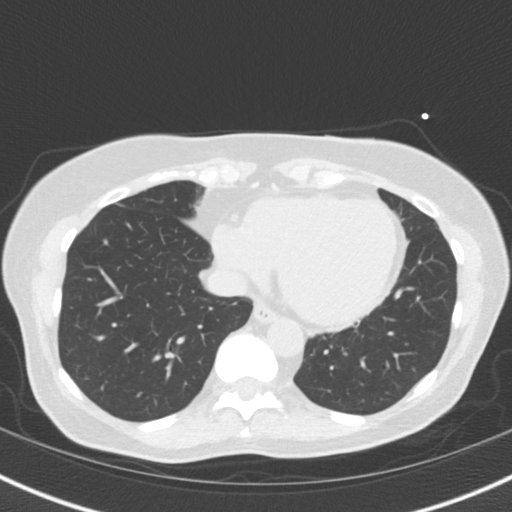
[im 21/47  lung]
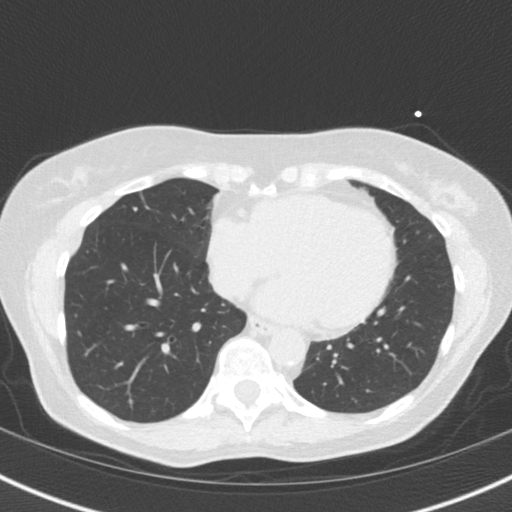
[im 26/47  lung]
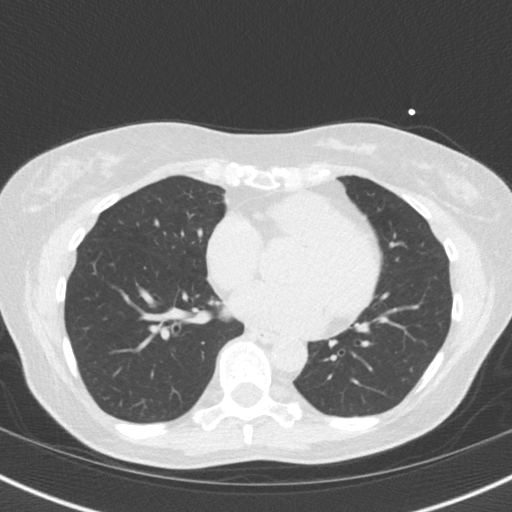
[im 31/47  lung]
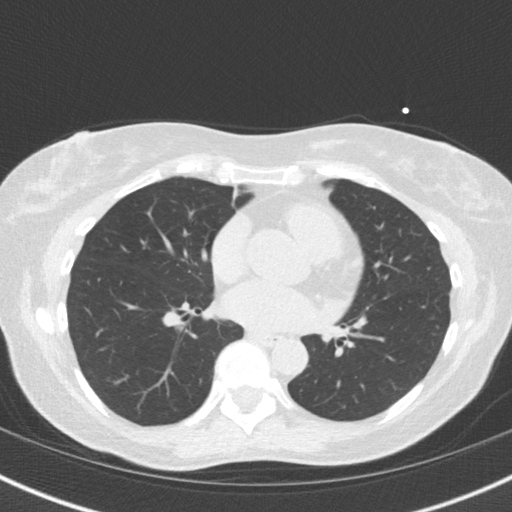
[im 36/47  lung]
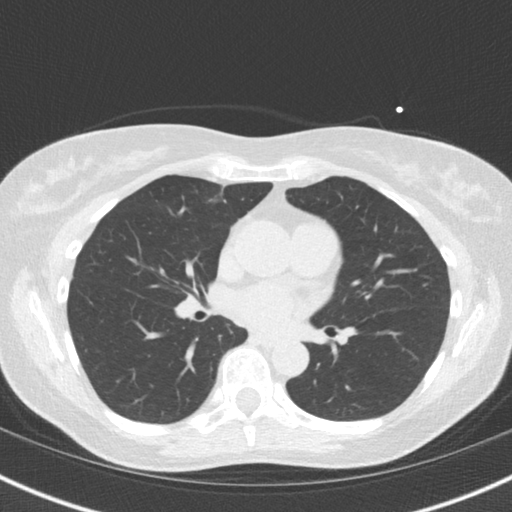
[im 41/47  lung]
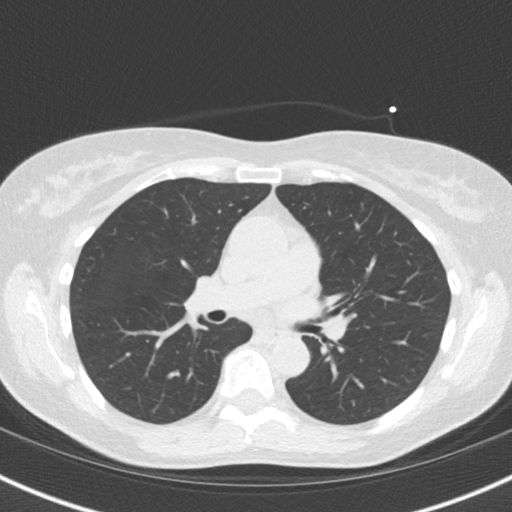

[16 of 20 positions shown; findings below may reference images not displayed]

FINDINGS: Aortic atherosclerosis. Multiple tiny pulmonary nodules are noted
throughout the lungs bilaterally, largest of which measures only 4
mm in the medial segment of the right middle lobe (axial image 31 of
series 3). Within the visualized portions of the thorax there are no
other larger more suspicious appearing pulmonary nodules or masses,
there is no acute consolidative airspace disease, no pleural
effusions, no pneumothorax and no lymphadenopathy. Visualized
portions of the upper abdomen are unremarkable. There are no
aggressive appearing lytic or blastic lesions noted in the
visualized portions of the skeleton.
IMPRESSION: 1.  Aortic Atherosclerosis ([id number]).
2. Multiple small pulmonary nodules scattered throughout the lungs
bilaterally measuring 4 mm or less in size, nonspecific but
statistically likely benign. No follow-up needed if patient is
low-risk (and has no known or suspected primary neoplasm).
Non-contrast chest CT can be considered in 12 months if patient is
high-risk. This recommendation follows the consensus statement:
Guidelines for Management of Incidental Pulmonary Nodules Detected
[DATE].
FINDINGS: Non-cardiac: See separate report from [REDACTED].

Ascending aorta: Normal diameter 3.6 cm

Pericardium: Normal

Coronary arteries: One isolated area of calcium in proximal LAD
IMPRESSION: Coronary calcium score of 5. This was 38 th percentile for age and
sex matched control.

Ervista Ceola

*** End of Addendum ***

## 2019-06-24 ENCOUNTER — Ambulatory Visit: Payer: Medicare Other | Admitting: Adult Health

## 2019-06-24 ENCOUNTER — Ambulatory Visit: Payer: Self-pay | Admitting: Adult Health

## 2019-07-07 NOTE — Progress Notes (Signed)
MEDICARE ANNUAL WELLNESS VISIT AND FOLLOW UP  Assessment:    Encounter for Medicare annual wellness exam  Essential hypertension - continue medications, DASH diet, exercise and monitor at home. Call if greater than 130/80.  -     CBC with Differential/Platelet -     CMP/GFR -     TSH  Migraine without status migrainosus, not intractable, unspecified migraine type Avoid triggers  SVT (supraventricular tachycardia) (HCC) Continue cardio follow up as recommended No symptoms at this time Had ED visits x 2 Controlled with atenolol   GERD Continue PPI/H2 blocker, diet discussed  Hyperlipidemia -continue medications, check lipids, decrease fatty foods, increase activity.  -     Lipid panel  Prediabetes Discussed general issues about diabetes pathophysiology and management., Educational material distributed., Suggested low cholesterol diet., Encouraged aerobic exercise., Discussed foot care., Reminded to get yearly retinal exam.  Vitamin D deficiency At goal at recent check; continue to recommend supplementation for goal of 70-100 Defer vitamin D level  Facial neuralgia Continue follow up /medications  Medication management  Overweight - long discussion about weight loss, diet, and exercise  Insomnia/anxiety Increased need with grief reaction; discussed low dose trazodone which would be safer for daily use Start 25-50 mg daily PRN;  Stress management techniques discussed, increase water, good sleep hygiene discussed, increase exercise, and increase veggies.   Family history of CAD Female family members with MI and CAD requiring stents Had normal myoview 09/2018 by Dr. Eden EmmsNishan Control blood pressure, cholesterol, glucose, increase exercise.    Over 30 minutes of exam, counseling, chart review, and critical decision making was performed  Future Appointments  Date Time Provider Department Center  10/28/2019  3:00 PM Lucky CowboyMcKeown, William, MD [facility] None     Plan:    During the course of the visit the patient was educated and counseled about appropriate screening and preventive services including:    Pneumococcal vaccine   Influenza vaccine  Td vaccine  Prevnar 13  Screening electrocardiogram  Screening mammography  Bone densitometry screening  Colorectal cancer screening  Diabetes screening  Glaucoma screening  Nutrition counseling   Advanced directives: given info/requested copies   Subjective:   Jean Drake is a 73 y.o. female who presents for Medicare Annual Wellness Visit and 3 month follow up on hypertension, prediabetes, hyperlipidemia, vitamin D def.   Husband passed away in a tractor accident this month; she does report will cry spontaneously but feels she is doing overall, kids are coming to visit every night.   She is prescribed xanax 0.5 mg BID PRN anxiety/insomnia; was using  1/2 tab at needed in the evening, usually needs 0-3 per week, currently taking a whole tab daily in the past month.   BMI is Body mass index is 26.95 kg/m., she has been working on diet and exercise, though limited since covid 19 and not being able to go to the gym, does walk 3 days a week 15-20 min.  Wt Readings from Last 3 Encounters:  07/08/19 145 lb (65.8 kg)  04/02/19 148 lb 12.8 oz (67.5 kg)  12/30/18 149 lb 3.2 oz (67.7 kg)   She has hx of episodes of SVT for which she presented to ED x2; improved with addition of atenolol.  She has family hx of CAD, underwent myoview 09/2018 by Dr. Eden EmmsNishan which was normal.  Her blood pressure has been controlled at home, today their BP is BP: 130/82 She does workout. She denies chest pain, shortness of breath, dizziness.  She notes  that she still has the occasional palpitation but she takes an extra atenolol and aspirin.    She is on cholesterol medication (rosuvastatin 10 mg daily since the last visit). Her cholesterol is not at goal. The cholesterol last visit was:   Lab Results  Component Value  Date   CHOL 213 (H) 04/02/2019   HDL 65 04/02/2019   LDLCALC 121 (H) 04/02/2019   TRIG 156 (H) 04/02/2019   CHOLHDL 3.3 04/02/2019    Last A1C in the office was:  Lab Results  Component Value Date   HGBA1C 5.4 04/02/2019   Last GFR Lab Results  Component Value Date   GFRNONAA 67 04/02/2019   Patient is on Vitamin D supplement. Lab Results  Component Value Date   VD25OH 60 04/02/2019      Medication Review Current Outpatient Medications on File Prior to Visit  Medication Sig Dispense Refill  . ALPRAZolam (XANAX) 0.5 MG tablet Take 1 tablet (0.5 mg total) by mouth 2 (two) times daily as needed. for anxiety 60 tablet 0  . Ascorbic Acid (VITAMIN C PO) Take by mouth. Takes 1 tablet 2 to 3 days a week.    Marland Kitchen aspirin 81 MG tablet Take 81 mg by mouth daily.    Marland Kitchen atenolol (TENORMIN) 50 MG tablet TAKE ONE TABLET BY MOUTH DAILY 90 tablet 3  . Cholecalciferol (VITAMIN D) 2000 UNITS tablet Take 6,000 Units by mouth daily.     . fexofenadine (ALLEGRA) 180 MG tablet Take 180 mg by mouth daily.    Marland Kitchen MAGNESIUM OXIDE PO Take 250 mg by mouth daily.    . Melatonin 3 MG TABS Take 10 mg by mouth. Takes 2 tablets at night    . Multiple Vitamin (MULTIVITAMIN) tablet Take 1 tablet by mouth daily.    . multivitamin-lutein (OCUVITE-LUTEIN) CAPS capsule Take 1 capsule by mouth daily.    . Omega-3 Fatty Acids (FISH OIL) 1000 MG CAPS Take 1 capsule by mouth daily.    Marland Kitchen OVER THE COUNTER MEDICATION Takes Vitamin B Complex 1 daily    . rosuvastatin (CRESTOR) 10 MG tablet Take 1 tablet Daily for Cholesterol 90 tablet 3   No current facility-administered medications on file prior to visit.     Current Problems (verified) Patient Active Problem List   Diagnosis Date Noted  . CKD (chronic kidney disease) stage 3, GFR 30-59 ml/min (HCC) 09/21/2018  . Osteopenia 09/05/2018  . FHx: heart disease 06/18/2018  . Insomnia 12/06/2017  . SVT (supraventricular tachycardia) ([place]) 11/15/2015  . Overweight (BMI  25.0-29.9) 11/18/2014  . Medication management 04/20/2014  . Essential hypertension 10/20/2013  . Hyperlipidemia, mixed 10/20/2013  . Abnormal glucose 10/20/2013  . Vitamin D deficiency 10/20/2013  . GERD 10/20/2013  . Migraine headache 10/20/2013  . 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 DTaP 09/12/2013  . Influenza Split 09/12/2013  . Influenza, High Dose Seasonal PF 10/04/2014, 08/30/2017  . Pneumococcal Conjugate-13 10/04/2014  . Pneumococcal Polysaccharide-23 03/29/2009  . Td 12/20/2003  . Tdap 09/12/2013  . Zoster 06/07/2010    Preventative care: Last colonoscopy: 2002, 2008 norm, due has been 10 years, she requests cologuard Last mammogram: 08/2018 at [facility] Dr. Ulanda Edison PAP/pelvic: 2019 at [place]: 08/2018 spine T -1.5 Echo 12/2015 Myocardial perfusion: 09/2018  Prior vaccinations: TD or Tdap: 2014  Influenza: 2018 - declines  Pneumococcal: 2010 Prevnar13: 2015 Shingles/Zostavax: 2011  Names of Other Physician/Practitioners you currently use: 1. [facility]- here for primary care 2. Dr. Dione Booze , eye doctor, last visit 2019, has scheduled 10/22/2019 3. Dr. Irving Copas, dentist, last visit 2020, goes q84m  Patient Care Team: Lucky Cowboy, MD as PCP - General (Internal Medicine) Wendall Stade, MD as PCP - Cardiology (Cardiology) Louis Meckel, MD (Inactive) as Consulting Physician (Gastroenterology) Tracey Harries, MD as Consulting Physician (Obstetrics and Gynecology)  Allergies Allergies  Allergen Reactions  . Penicillins Anaphylaxis  . Codeine Nausea And Vomiting  . Sudafed [Pseudoephedrine Hcl]     Makes heart race, per pt     SURGICAL HISTORY She  has a past surgical history that includes No past surgeries. FAMILY HISTORY Her family history includes Anuerysm in her sister; Breast cancer in her mother; CAD in her mother; Heart attack (age of  onset: 44) in her sister; Heart disease in her mother; Kidney failure in her paternal uncle; Lung cancer in her father; Polycystic kidney disease in her paternal uncle; Stroke in her maternal grandmother. SOCIAL HISTORY She  reports that she has never smoked. She has never used smokeless tobacco. She reports that she does not drink alcohol.  MEDICARE WELLNESS OBJECTIVES: Physical activity: Current Exercise Habits: Home exercise routine, Type of exercise: walking, Time (Minutes): 15, Frequency (Times/Week): 3, Weekly Exercise (Minutes/Week): 45, Intensity: Mild, Exercise limited by: None identified Cardiac risk factors: Cardiac Risk Factors include: advanced age (>14men, >31 women);dyslipidemia;hypertension;sedentary lifestyle Depression/mood screen:   Depression screen [facility] 2/9 07/08/2019  Decreased Interest 0  Down, Depressed, Hopeless 1  PHQ - 2 Score 1    ADLs:  In your present state of health, do you have any difficulty performing the following activities: 07/08/2019 04/04/2019  Hearing? N N  Vision? N N  Difficulty concentrating or making decisions? N N  Walking or climbing stairs? N N  Dressing or bathing? N N  Doing errands, shopping? N N  Some recent data might be hidden    Cognitive Testing  Alert? Yes  Normal Appearance?Yes  Oriented to person? Yes  Place? Yes   Time? Yes  Recall of three objects?  Yes  Can perform simple calculations? Yes  Displays appropriate judgment?Yes  Can read the correct time from a watch face?Yes  EOL planning: Does Patient Have a Medical Advance Directive?: Yes Type of Advance Directive: Healthcare Power of Attorney, Living will Does patient want to make changes to medical advance directive?: No - Patient declined Copy of Healthcare Power of Attorney in Chart?: No - copy requested   Objective:   Today's Vitals   07/08/19 1101  BP: 130/82  Pulse: 63  Temp: (!) 97.5 F (36.4 C)  SpO2: 96%  Weight: 145 lb (65.8 kg)  Height: 5' 1.5" (1.562  m)   Body mass index is 26.95 kg/m.  General appearance: alert, no distress, WD/WN,  female HEENT: normocephalic, sclerae anicteric, TMs pearly, nares patent, no discharge or erythema, pharynx normal Oral cavity: MMM, no lesions Neck: supple, no lymphadenopathy, no thyromegaly, no masses Heart: RRR, normal S1, S2, no murmurs Lungs: CTA bilaterally, no wheezes, rhonchi, or rales Abdomen: +bs, soft, non tender, + distended/full lower AB, no masses, no hepatomegaly, no splenomegaly Musculoskeletal: nontender, no swelling, no obvious deformity Extremities: no edema, no cyanosis, no clubbing Pulses: 2+ symmetric, upper and lower extremities, normal cap refill Neurological: alert, oriented x 3, CN2-12 intact, strength normal upper extremities and lower extremities, sensation normal throughout, DTRs 2+ throughout, no cerebellar signs, gait normal Psychiatric: normal affect, behavior normal, pleasant  Breast: defer Gyn:  defer Rectal: defer   Medicare Attestation I have personally reviewed: The patient's medical and social history Their use of alcohol, tobacco or illicit drugs Their current medications and supplements The patient's functional ability including ADLs,fall risks, home safety risks, cognitive, and hearing and visual impairment Diet and physical activities Evidence for depression or mood disorders  The patient's weight, height, BMI, and visual acuity have been recorded in the chart.  I have made referrals, counseling, and provided education to the patient based on review of the above and I have provided the patient with a written personalized care plan for preventive services.     Dan MakerAshley C Averlee Swartz, NP   07/08/2019

## 2019-07-08 ENCOUNTER — Other Ambulatory Visit: Payer: Self-pay

## 2019-07-08 ENCOUNTER — Ambulatory Visit (INDEPENDENT_AMBULATORY_CARE_PROVIDER_SITE_OTHER): Payer: Medicare Other | Admitting: Adult Health

## 2019-07-08 ENCOUNTER — Encounter: Payer: Self-pay | Admitting: Adult Health

## 2019-07-08 VITALS — BP 130/82 | HR 63 | Temp 97.5°F | Ht 61.5 in | Wt 145.0 lb

## 2019-07-08 DIAGNOSIS — G43909 Migraine, unspecified, not intractable, without status migrainosus: Secondary | ICD-10-CM

## 2019-07-08 DIAGNOSIS — M8588 Other specified disorders of bone density and structure, other site: Secondary | ICD-10-CM

## 2019-07-08 DIAGNOSIS — E782 Mixed hyperlipidemia: Secondary | ICD-10-CM

## 2019-07-08 DIAGNOSIS — R7309 Other abnormal glucose: Secondary | ICD-10-CM

## 2019-07-08 DIAGNOSIS — Z0001 Encounter for general adult medical examination with abnormal findings: Secondary | ICD-10-CM

## 2019-07-08 DIAGNOSIS — E663 Overweight: Secondary | ICD-10-CM

## 2019-07-08 DIAGNOSIS — K2100 Gastro-esophageal reflux disease with esophagitis, without bleeding: Secondary | ICD-10-CM

## 2019-07-08 DIAGNOSIS — N183 Chronic kidney disease, stage 3 unspecified: Secondary | ICD-10-CM

## 2019-07-08 DIAGNOSIS — I471 Supraventricular tachycardia: Secondary | ICD-10-CM

## 2019-07-08 DIAGNOSIS — G518 Other disorders of facial nerve: Secondary | ICD-10-CM

## 2019-07-08 DIAGNOSIS — Z0000 Encounter for general adult medical examination without abnormal findings: Secondary | ICD-10-CM

## 2019-07-08 DIAGNOSIS — Z79899 Other long term (current) drug therapy: Secondary | ICD-10-CM

## 2019-07-08 DIAGNOSIS — Z1211 Encounter for screening for malignant neoplasm of colon: Secondary | ICD-10-CM

## 2019-07-08 DIAGNOSIS — F5101 Primary insomnia: Secondary | ICD-10-CM

## 2019-07-08 DIAGNOSIS — I10 Essential (primary) hypertension: Secondary | ICD-10-CM | POA: Diagnosis not present

## 2019-07-08 DIAGNOSIS — R6889 Other general symptoms and signs: Secondary | ICD-10-CM | POA: Diagnosis not present

## 2019-07-08 DIAGNOSIS — E559 Vitamin D deficiency, unspecified: Secondary | ICD-10-CM

## 2019-07-08 MED ORDER — TRAZODONE HCL 50 MG PO TABS: ORAL_TABLET | ORAL | 2 refills | Status: DC

## 2019-07-08 NOTE — Patient Instructions (Addendum)
  Ms. Tarman , Thank you for taking time to come for your Medicare Wellness Visit. I appreciate your ongoing commitment to your health goals. Please review the following plan we discussed and let me know if I can assist you in the future.   These are the goals we discussed: Goals    . Exercise 150 min/wk Moderate Activity       This is a list of the screening recommended for you and due dates:  Health Maintenance  Topic Date Due  . Colon Cancer Screening  06/12/2017  . Flu Shot  06/13/2019  . Mammogram  09/05/2020  . Tetanus Vaccine  09/13/2023  . DEXA scan (bone density measurement)  Completed  .  Hepatitis C: One time screening is recommended by Center for Disease Control  (CDC) for  adults born from [birth year] through 1965.   Completed  . Pneumonia vaccines  Discontinued    Start with 1/2 tab (25 mg) of trazodone 1 hour prior to sleep for insomnia. Increase dose as needed gradually up to 2 tabs; can take earlier in the evening if having morning drowsiness, or reduce dose back down.   Message or call when you are running out to let me know what dose worked best for you so I can update the dose and quanity to 90 day supply.    COLOGUARD INFORMATION   Cologuard is an easy to use noninvasive colon cancer screening test based on the latest advances in stool DNA science.   Colon cancer is 3rd most diagnosed cancer and 2nd leading cause of death in both men and women 46 years of age and older despite being one of the most preventable and treatable cancers if found early.  4 of out 5 people diagnosed with colon cancer have NO prior family history.  When caught EARLY 90% of colon cancer is curable.   You have opted to pursue the cologuard test.   [facility] will ship your collection kit directly to you. You will collect a single stool sample in the privacy of your own home, no special preparation required. You will return the kit via [facility] pre-paid shipping or pick-up, in the  same box it arrived in. Then I will contact you to discuss your results after I receive them from the laboratory.   If you have any questions or concerns, Cologuard Customer Support Specialist are available 24 hours a day, 7 days a week at [phone] or go to [email or web address].

## 2019-07-09 LAB — COMPLETE METABOLIC PANEL WITH GFR
AG Ratio: 2 (calc) (ref 1.0–2.5)
ALT: 19 U/L (ref 6–29)
AST: 25 U/L (ref 10–35)
Albumin: 4.3 g/dL (ref 3.6–5.1)
Alkaline phosphatase (APISO): 65 U/L (ref 37–153)
BUN: 7 mg/dL (ref 7–25)
CO2: 25 mmol/L (ref 20–32)
Calcium: 9.6 mg/dL (ref 8.6–10.4)
Chloride: 103 mmol/L (ref 98–110)
Creat: 0.8 mg/dL (ref 0.60–0.93)
GFR, Est African American: 85 mL/min/{1.73_m2} (ref >=60)
GFR, Est Non African American: 73 mL/min/{1.73_m2} (ref >=60)
Globulin: 2.2 g/dL (calc) (ref 1.9–3.7)
Glucose, Bld: 100 mg/dL — ABNORMAL HIGH (ref 65–99)
Potassium: 4.2 mmol/L (ref 3.5–5.3)
Sodium: 137 mmol/L (ref 135–146)
Total Bilirubin: 0.4 mg/dL (ref 0.2–1.2)
Total Protein: 6.5 g/dL (ref 6.1–8.1)

## 2019-07-09 LAB — LIPID PANEL
Cholesterol: 127 mg/dL (ref <200)
HDL: 67 mg/dL (ref >=50)
LDL Cholesterol (Calc): 46 mg/dL (calc)
Non-HDL Cholesterol (Calc): 60 mg/dL (calc) (ref <130)
Total CHOL/HDL Ratio: 1.9 (calc) (ref <5.0)
Triglycerides: 56 mg/dL (ref <150)

## 2019-07-09 LAB — CBC WITH DIFFERENTIAL/PLATELET
Absolute Monocytes: 561 cells/uL (ref 200–950)
Basophils Absolute: 61 cells/uL (ref 0–200)
Basophils Relative: 1.1 %
Eosinophils Absolute: 132 cells/uL (ref 15–500)
Eosinophils Relative: 2.4 %
HCT: 42 % (ref 35.0–45.0)
Hemoglobin: 14.1 g/dL (ref 11.7–15.5)
Lymphs Abs: 880 cells/uL (ref 850–3900)
MCH: 29.6 pg (ref 27.0–33.0)
MCHC: 33.6 g/dL (ref 32.0–36.0)
MCV: 88.2 fL (ref 80.0–100.0)
MPV: 9.2 fL (ref 7.5–12.5)
Monocytes Relative: 10.2 %
Neutro Abs: 3867 cells/uL (ref 1500–7800)
Neutrophils Relative %: 70.3 %
Platelets: 304 10*3/uL (ref 140–400)
RBC: 4.76 10*6/uL (ref 3.80–5.10)
RDW: 12.5 % (ref 11.0–15.0)
Total Lymphocyte: 16 %
WBC: 5.5 10*3/uL (ref 3.8–10.8)

## 2019-07-09 LAB — TSH: TSH: 0.55 mIU/L (ref 0.40–4.50)

## 2019-08-05 ENCOUNTER — Other Ambulatory Visit: Payer: Self-pay | Admitting: Internal Medicine

## 2019-08-05 DIAGNOSIS — Z1231 Encounter for screening mammogram for malignant neoplasm of breast: Secondary | ICD-10-CM

## 2019-08-10 DIAGNOSIS — Z1212 Encounter for screening for malignant neoplasm of rectum: Secondary | ICD-10-CM | POA: Diagnosis not present

## 2019-08-10 DIAGNOSIS — Z1211 Encounter for screening for malignant neoplasm of colon: Secondary | ICD-10-CM | POA: Diagnosis not present

## 2019-08-17 ENCOUNTER — Encounter: Payer: Self-pay | Admitting: Internal Medicine

## 2019-08-17 LAB — COLOGUARD: Cologuard: NEGATIVE

## 2019-09-21 ENCOUNTER — Ambulatory Visit
Admission: RE | Admit: 2019-09-21 | Discharge: 2019-09-21 | Disposition: A | Discharge status: Home / Self Care | Payer: Medicare Other | Source: Ambulatory Visit | Attending: Internal Medicine | Admitting: Internal Medicine

## 2019-09-21 ENCOUNTER — Other Ambulatory Visit: Payer: Self-pay

## 2019-09-21 DIAGNOSIS — Z1231 Encounter for screening mammogram for malignant neoplasm of breast: Secondary | ICD-10-CM

## 2019-09-23 DIAGNOSIS — Z0120 Encounter for dental examination and cleaning without abnormal findings: Secondary | ICD-10-CM | POA: Diagnosis not present

## 2019-09-29 ENCOUNTER — Other Ambulatory Visit: Payer: Self-pay | Admitting: Internal Medicine

## 2019-10-28 ENCOUNTER — Encounter: Payer: Self-pay | Admitting: Internal Medicine

## 2019-11-10 ENCOUNTER — Encounter: Payer: Self-pay | Admitting: Internal Medicine

## 2019-11-10 ENCOUNTER — Encounter: Payer: Medicare Other | Admitting: Internal Medicine

## 2019-11-10 NOTE — Progress Notes (Signed)
Annual Screening/Preventative Visit & Comprehensive Evaluation &  Examination     This very nice 73 y.o. [name] presents for a Screening /Preventative Visit & comprehensive evaluation and management of multiple medical co-morbidities.  Patient has been followed for HTN, HLD, Prediabetes  and Vitamin D Deficiency.      HTN predates since 2012. Patient's BP has been controlled at home and patient denies any cardiac symptoms as chest pain, palpitations, shortness of breath, dizziness or ankle swelling. Today's BP: 130/82      Patient's hyperlipidemia is controlled with diet and medications. Patient denies myalgias or other medication SE's. Last lipids were at goal:  Lab Results  Component Value Date   CHOL 127 07/08/2019   HDL 67 07/08/2019   LDLCALC 46 07/08/2019   TRIG 56 07/08/2019   CHOLHDL 1.9 07/08/2019      Patient is monitored expectantly for glucose intolerance and patient denies reactive hypoglycemic symptoms, visual blurring, diabetic polys or paresthesias. Last A1c was at goal:  Lab Results  Component Value Date   HGBA1C 5.4 04/02/2019      Finally, patient has history of Vitamin D Deficiency  ("9" / 2009) and last Vitamin D was at goal:  Lab Results  Component Value Date   VD25OH 60 04/02/2019   Current Outpatient Medications on File Prior to Visit  Medication Sig  . ALPRAZolam (XANAX) 0.5 MG tablet Take 1 tablet (0.5 mg total) by mouth 2 (two) times daily as needed. for anxiety  . Ascorbic Acid (VITAMIN C PO) Take 1,000 mg by mouth daily.   Marland Kitchen aspirin 81 MG tablet Take 81 mg by mouth daily.  Marland Kitchen atenolol (TENORMIN) 50 MG tablet TAKE ONE TABLET BY MOUTH DAILY  . Cholecalciferol (VITAMIN D) 2000 UNITS tablet Take 6,000 Units by mouth daily.   . fexofenadine (ALLEGRA) 180 MG tablet Take 180 mg by mouth daily.  Marland Kitchen MAGNESIUM OXIDE PO Take 250 mg by mouth daily.  . Melatonin 5 MG TABS Take by mouth. Patient takes 6 tablets to equal 30 mg at bedtime.  . Multiple Vitamin  (MULTIVITAMIN) tablet Take 1 tablet by mouth daily.  . multivitamin-lutein (OCUVITE-LUTEIN) CAPS capsule Take 1 capsule by mouth daily.  . Omega-3 Fatty Acids (FISH OIL) 1000 MG CAPS Take 1 capsule by mouth daily.  Marland Kitchen OVER THE COUNTER MEDICATION Takes Vitamin B Complex 1 daily  . rosuvastatin (CRESTOR) 10 MG tablet Take 1 tablet Daily for Cholesterol  . Zinc 50 MG TABS Take 100 mg by mouth daily.   No current facility-administered medications on file prior to visit.   Allergies  Allergen Reactions  . Penicillins Anaphylaxis  . Codeine Nausea And Vomiting  . Sudafed [Pseudoephedrine Hcl]     Makes heart race, per pt    Past Medical History:  Diagnosis Date  . Essential hypertension 10/20/2013  . 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 GERD 10/20/2013  . Hyperlipidemia 10/20/2013  . Migraine, unspecified, without mention of intractable migraine without mention of status migrainosus 10/20/2013  . Morbid obesity ([place]) 11/18/2014  . Prediabetes 10/20/2013  . SVT (supraventricular tachycardia) ([place]) 11/15/2015  . Vitamin D deficiency 10/20/2013   Health Maintenance  Topic Date Due  . COLONOSCOPY  06/12/2017  . INFLUENZA VACCINE  02/10/2020 (Originally 06/13/2019)  . MAMMOGRAM  09/20/2021  . TETANUS/TDAP  09/13/2023  . DEXA SCAN  Completed  . Hepatitis C Screening  Completed  . PNA vac Low Risk Adult  Discontinued   Immunization History  Administered Date(s) Administered  . DTaP 09/12/2013  .  Influenza Split 09/12/2013  . Influenza, High Dose Seasonal PF 10/04/2014, 08/30/2017  . Pneumococcal Conjugate-13 10/04/2014  . Pneumococcal Polysaccharide-23 03/29/2009  . Td 12/20/2003  . Tdap 09/12/2013  . Zoster 06/07/2010   Last Colon -  06/13/2007 - Dr Arlyce DiceKaplan - Negative   Cologard 190/03/2019 - Negative - 3 yr f/u due Oct 2023  Last MGM - 09/22/2019  Past Surgical History:  Procedure Laterality Date  . NO PAST SURGERIES     Family History  Problem Relation Age of Onset  . Heart disease  Mother   . Breast cancer Mother   . CAD Mother        stents emergently in her [id number]  . Lung cancer Father        smoker, mets to brain  . Stroke Maternal Grandmother   . Polycystic kidney disease Paternal Uncle   . Kidney failure Paternal Uncle   . Heart attack Sister [id number]       LAD  . Anuerysm Sister    Social History   Tobacco Use  . Smoking status: Never Smoker  . Smokeless tobacco: Never Used  Substance Use Topics  . Alcohol use: No  . Drug use: Not on file    ROS Constitutional: Denies fever, chills, weight loss/gain, headaches, insomnia,  night sweats, and change in appetite. Does c/o fatigue. Eyes: Denies redness, blurred vision, diplopia, discharge, itchy, watery eyes.  ENT: Denies discharge, congestion, post nasal drip, epistaxis, sore throat, earache, hearing loss, dental pain, Tinnitus, Vertigo, Sinus pain, snoring.  Cardio: Denies chest pain, palpitations, irregular heartbeat, syncope, dyspnea, diaphoresis, orthopnea, PND, claudication, edema Respiratory: denies cough, dyspnea, DOE, pleurisy, hoarseness, laryngitis, wheezing.  Gastrointestinal: Denies dysphagia, heartburn, reflux, water brash, pain, cramps, nausea, vomiting, bloating, diarrhea, constipation, hematemesis, melena, hematochezia, jaundice, hemorrhoids Genitourinary: Denies dysuria, frequency, urgency, nocturia, hesitancy, discharge, hematuria, flank pain Breast: Breast lumps, nipple discharge, bleeding.  Musculoskeletal: Denies arthralgia, myalgia, stiffness, Jt. Swelling, pain, limp, and strain/sprain. Denies falls. Skin: Denies puritis, rash, hives, warts, acne, eczema, changing in skin lesion Neuro: No weakness, tremor, incoordination, spasms, paresthesia, pain Psychiatric: Denies confusion, memory loss, sensory loss. Denies Depression. Endocrine: Denies change in weight, skin, hair change, nocturia, and paresthesia, diabetic polys, visual blurring, hyper / hypo glycemic episodes.  Heme/Lymph: No excessive  bleeding, bruising, enlarged lymph nodes.  Physical Exam  BP 130/82   Pulse 68   Temp (!) 97 F (36.1 C)   Resp 16   Ht 5\' 1"  (1.549 m)   Wt 142 lb 3.2 oz (64.5 kg)   BMI 26.87 kg/m   General Appearance: Well nourished, well groomed and in no apparent distress.  Eyes: PERRLA, EOMs, conjunctiva no swelling or erythema, normal fundi and vessels. Sinuses: No frontal/maxillary tenderness ENT/Mouth: EACs patent / TMs  nl. Nares clear without erythema, swelling, mucoid exudates. Oral hygiene is good. No erythema, swelling, or exudate. Tongue normal, non-obstructing. Tonsils not swollen or erythematous. Hearing normal.  Neck: Supple, thyroid not palpable. No bruits, nodes or JVD. Respiratory: Respiratory effort normal.  BS equal and clear bilateral without rales, rhonci, wheezing or stridor. Cardio: Heart sounds are normal with regular rate and rhythm and no murmurs, rubs or gallops. Peripheral pulses are normal and equal bilaterally without edema. No aortic or femoral bruits. Chest: symmetric with normal excursions and percussion. Breasts: Symmetric, without lumps, nipple discharge, retractions, or fibrocystic changes.  Abdomen: Flat, soft with bowel sounds active. Nontender, no guarding, rebound, hernias, masses, or organomegaly.  Lymphatics: Non tender without lymphadenopathy.  Genitourinary:  Musculoskeletal: Full ROM all peripheral extremities, joint stability, 5/5 strength, and normal gait. Skin: Warm and dry without rashes, lesions, cyanosis, clubbing or  ecchymosis.  Neuro: Cranial nerves intact, reflexes equal bilaterally. Normal muscle tone, no cerebellar symptoms. Sensation intact.  Pysch: Alert and oriented X 3, normal affect, Insight and Judgment appropriate.   Assessment and Plan  1. Annual Preventative Screening Examination  2. Essential hypertension  - EKG 12-Lead - Urinalysis, Routine w reflex microscopic - Microalbumin / Creatinine Urine Ratio - CBC with Diff -  COMPLETE METABOLIC PANEL WITH GFR - Magnesium - TSH  3. Hyperlipidemia, mixed  - EKG 12-Lead - Lipid Profile - TSH  4. Abnormal glucose  - EKG 12-Lead - Hemoglobin A1c ([facility]) - Insulin, random  5. Vitamin D deficiency  - EKG 12-Lead - Vitamin D (25 hydroxy)  6. Prediabetes  - EKG 12-Lead - Hemoglobin A1c ([facility]) - Insulin, random  7. Screening for colon cancer   8. Screening for ischemic heart disease  - EKG 12-Lead - Lipid Profile  9. FHx: heart disease  - EKG 12-Lead  10. Medication management  - Urinalysis, Routine w reflex microscopic - Microalbumin / Creatinine Urine Ratio - CBC with Diff - COMPLETE METABOLIC PANEL WITH GFR - Magnesium - Lipid Profile - TSH - Hemoglobin A1c ([facility]) - Insulin, random - Vitamin D (25 hydroxy)        Patient was counseled in prudent diet to achieve/maintain BMI less than 25 for weight control, BP monitoring, regular exercise and medications. Discussed med's effects and SE's. Screening labs and tests as requested with regular follow-up as recommended. Over 40 minutes of exam, counseling, chart review and high complex critical decision making was performed.   Marinus Maw, MD

## 2019-11-10 NOTE — Patient Instructions (Signed)

## 2019-11-11 ENCOUNTER — Other Ambulatory Visit: Payer: Self-pay

## 2019-11-11 ENCOUNTER — Ambulatory Visit (INDEPENDENT_AMBULATORY_CARE_PROVIDER_SITE_OTHER): Payer: Medicare Other | Admitting: Internal Medicine

## 2019-11-11 VITALS — BP 130/82 | HR 68 | Temp 97.0°F | Resp 16 | Ht 61.0 in | Wt 142.2 lb

## 2019-11-11 DIAGNOSIS — Z1211 Encounter for screening for malignant neoplasm of colon: Secondary | ICD-10-CM

## 2019-11-11 DIAGNOSIS — N183 Chronic kidney disease, stage 3 unspecified: Secondary | ICD-10-CM

## 2019-11-11 DIAGNOSIS — I10 Essential (primary) hypertension: Secondary | ICD-10-CM | POA: Diagnosis not present

## 2019-11-11 DIAGNOSIS — Z0000 Encounter for general adult medical examination without abnormal findings: Secondary | ICD-10-CM | POA: Diagnosis not present

## 2019-11-11 DIAGNOSIS — Z8249 Family history of ischemic heart disease and other diseases of the circulatory system: Secondary | ICD-10-CM | POA: Diagnosis not present

## 2019-11-11 DIAGNOSIS — Z79899 Other long term (current) drug therapy: Secondary | ICD-10-CM

## 2019-11-11 DIAGNOSIS — Z136 Encounter for screening for cardiovascular disorders: Secondary | ICD-10-CM | POA: Diagnosis not present

## 2019-11-11 DIAGNOSIS — R7303 Prediabetes: Secondary | ICD-10-CM

## 2019-11-11 DIAGNOSIS — E559 Vitamin D deficiency, unspecified: Secondary | ICD-10-CM

## 2019-11-11 DIAGNOSIS — R7309 Other abnormal glucose: Secondary | ICD-10-CM

## 2019-11-11 DIAGNOSIS — E782 Mixed hyperlipidemia: Secondary | ICD-10-CM

## 2019-11-11 DIAGNOSIS — Z0001 Encounter for general adult medical examination with abnormal findings: Secondary | ICD-10-CM

## 2019-11-12 LAB — LIPID PANEL
Cholesterol: 126 mg/dL (ref <200)
HDL: 70 mg/dL (ref >=50)
LDL Cholesterol (Calc): 42 mg/dL (calc)
Non-HDL Cholesterol (Calc): 56 mg/dL (calc) (ref <130)
Total CHOL/HDL Ratio: 1.8 (calc) (ref <5.0)
Triglycerides: 61 mg/dL (ref <150)

## 2019-11-12 LAB — URINALYSIS, ROUTINE W REFLEX MICROSCOPIC
Bilirubin Urine: NEGATIVE
Glucose, UA: NEGATIVE
Hgb urine dipstick: NEGATIVE
Ketones, ur: NEGATIVE
Leukocytes,Ua: NEGATIVE
Nitrite: NEGATIVE
Protein, ur: NEGATIVE
Specific Gravity, Urine: 1.005 (ref 1.001–1.03)
pH: 7 (ref 5.0–8.0)

## 2019-11-12 LAB — CBC WITH DIFFERENTIAL/PLATELET
Absolute Monocytes: 566 cells/uL (ref 200–950)
Basophils Absolute: 73 cells/uL (ref 0–200)
Basophils Relative: 1.3 %
Eosinophils Absolute: 112 cells/uL (ref 15–500)
Eosinophils Relative: 2 %
HCT: 40.2 % (ref 35.0–45.0)
Hemoglobin: 13.3 g/dL (ref 11.7–15.5)
Lymphs Abs: 890 cells/uL (ref 850–3900)
MCH: 29.4 pg (ref 27.0–33.0)
MCHC: 33.1 g/dL (ref 32.0–36.0)
MCV: 88.7 fL (ref 80.0–100.0)
MPV: 9.4 fL (ref 7.5–12.5)
Monocytes Relative: 10.1 %
Neutro Abs: 3959 cells/uL (ref 1500–7800)
Neutrophils Relative %: 70.7 %
Platelets: 289 10*3/uL (ref 140–400)
RBC: 4.53 10*6/uL (ref 3.80–5.10)
RDW: 11.9 % (ref 11.0–15.0)
Total Lymphocyte: 15.9 %
WBC: 5.6 10*3/uL (ref 3.8–10.8)

## 2019-11-12 LAB — COMPLETE METABOLIC PANEL WITH GFR
AG Ratio: 2.1 (calc) (ref 1.0–2.5)
ALT: 21 U/L (ref 6–29)
AST: 28 U/L (ref 10–35)
Albumin: 4.1 g/dL (ref 3.6–5.1)
Alkaline phosphatase (APISO): 75 U/L (ref 37–153)
BUN: 13 mg/dL (ref 7–25)
CO2: 23 mmol/L (ref 20–32)
Calcium: 9.4 mg/dL (ref 8.6–10.4)
Chloride: 105 mmol/L (ref 98–110)
Creat: 0.9 mg/dL (ref 0.60–0.93)
GFR, Est African American: 74 mL/min/{1.73_m2} (ref >=60)
GFR, Est Non African American: 63 mL/min/{1.73_m2} (ref >=60)
Globulin: 2 g/dL (calc) (ref 1.9–3.7)
Glucose, Bld: 93 mg/dL (ref 65–99)
Potassium: 4.5 mmol/L (ref 3.5–5.3)
Sodium: 138 mmol/L (ref 135–146)
Total Bilirubin: 0.4 mg/dL (ref 0.2–1.2)
Total Protein: 6.1 g/dL (ref 6.1–8.1)

## 2019-11-12 LAB — MAGNESIUM: Magnesium: 1.9 mg/dL (ref 1.5–2.5)

## 2019-11-12 LAB — HEMOGLOBIN A1C
Hgb A1c MFr Bld: 5.5 % of total Hgb (ref <5.7)
Mean Plasma Glucose: 111 (calc)
eAG (mmol/L): 6.2 (calc)

## 2019-11-12 LAB — INSULIN, RANDOM: Insulin: 3.9 u[IU]/mL

## 2019-11-12 LAB — MICROALBUMIN / CREATININE URINE RATIO
Creatinine, Urine: 17 mg/dL — ABNORMAL LOW (ref 20–275)
Microalb, Ur: <0.2 mg/dL

## 2019-11-12 LAB — VITAMIN D 25 HYDROXY (VIT D DEFICIENCY, FRACTURES): Vit D, 25-Hydroxy: 78 ng/mL (ref 30–100)

## 2019-11-12 LAB — TSH: TSH: 1.31 mIU/L (ref 0.40–4.50)

## 2019-11-16 ENCOUNTER — Encounter: Payer: Medicare Other | Admitting: Internal Medicine

## 2019-12-29 ENCOUNTER — Other Ambulatory Visit: Payer: Self-pay | Admitting: Internal Medicine

## 2019-12-29 DIAGNOSIS — E782 Mixed hyperlipidemia: Secondary | ICD-10-CM

## 2020-02-08 NOTE — Progress Notes (Signed)
FOLLOW UP 3 MONTH  Assessment / Plan    Jean Drake was seen today for follow-up.  Diagnoses and all orders for this visit:  Essential hypertension Continue current medications: Monitor blood pressure at home; call if consistently over 130/80 Continue DASH diet.   Reminder to go to the ER if any CP, SOB, nausea, dizziness, severe HA, changes vision/speech, left arm numbness and tingling and jaw pain. -     CBC with Differential/Platelet -     COMPLETE METABOLIC PANEL WITH GFR -     Magnesium  Hyperlipidemia, mixed Continue medications: Crestor 10mg  daily, Fish Oil 2,000mg  BID Discussed dietary and exercise modifications Low fat diet -     Lipid panel -     rosuvastatin (CRESTOR) 10 MG tablet; Take 1 tablet Daily for Cholesterol  Migraine without status migrainosus, not intractable, unspecified migraine type Well controlled  SVT (supraventricular tachycardia) (HCC) Well controlled Taking atenolol, daily for this  Gastroesophageal reflux disease with esophagitis without hemorrhage Doing well at this time Diet discussed Monitor for triggers Avoid food with high acid content Avoid excessive cafeine Increase water intake  Stage 3 chronic kidney disease, unspecified whether stage 3a or 3b CKD Increase fluids  Avoid NSAIDS Blood pressure control Monitor sugars  Will continue to monitor  Osteopenia of spine -     DG Bone Density; Future  Primary insomnia Managing, intermittent difficulties Taking OTC Melatonin nightly Has trazodone discussed utilizing this, even half Discussed sleep hygiene  Facial neuralgia Doing well at this time Continue follow up as needed  Overweight (BMI 25.0-29.9) Discussed dietary and exercise modifications  Medication management Continued  Abnormal glucose Discussed dietary and exercise modifications  Vitamin D deficiency -     VITAMIN D 25 Hydroxy (Vit-D Deficiency, Fractures)  Family history of CAD Female family members with MI and  CAD requiring stents Had normal myoview 09/2018 by Dr. Johnsie Cancel Control blood pressure, cholesterol, glucose, increase exercise.    Over 30 minutes of face to face interview, exam, counseling, chart review, and critical decision making was performed  Future Appointments  Date Time Provider [place]  05/23/2020 11:30 AM Unk Pinto, MD [facility] None  07/12/2020 11:30 AM Garnet Sierras, NP [facility] None  12/01/2020 10:00 AM Unk Pinto, MD [facility] None     HPI:   Jean Drake is a 74 y.o. female who presents for 3 month follow up on HTN, HLD, prediabetes/abnormal glucose, , vitamin D def.   Husband passed away in a tractor accident this month; she does report will cry spontaneously but feels she is doing overall, kids are coming to visit every night and she talks on phone with friends regularly.  She has insomnia and was taking alprazolam at one point.   She has stopped this and has a prescription for trazodone.  She reports she has not been using this  She has tried melatonin and has had positive results although reports some night still not able to get to sleep.  BMI is Body mass index is 26.91 kg/m., she has been working on diet and exercise, though limited since covid 19 and not being able to go to the gym, does walk 3 days a week 15-20 min.  Wt Readings from Last 3 Encounters:  02/09/20 142 lb 6.4 oz (64.6 kg)  11/11/19 142 lb 3.2 oz (64.5 kg)  07/08/19 145 lb (65.8 kg)   She has hx of episodes of SVT for which she presented to ED x2; improved with addition of atenolol and  well controlled at this time.  She has family hx of CAD, underwent myoview 09/2018 by Dr. Eden Emms which was normal.  Her blood pressure has been controlled at home, today their BP is BP: 126/70 She does workout. She denies chest pain, shortness of breath, dizziness.  She notes that she still has the occasional palpitation but she takes an extra atenolol and aspirin.    She is on  cholesterol medication (rosuvastatin 10 mg daily since the last visit). Her cholesterol is not at goal. The cholesterol last visit was:   Lab Results  Component Value Date   CHOL 126 11/11/2019   HDL 70 11/11/2019   LDLCALC 42 11/11/2019   TRIG 61 11/11/2019   CHOLHDL 1.8 11/11/2019    Last A1C in the office was:  Lab Results  Component Value Date   HGBA1C 5.5 11/11/2019   Last GFR Lab Results  Component Value Date   GFRNONAA 63 11/11/2019   Patient is on Vitamin D supplement. Lab Results  Component Value Date   VD25OH 78 11/11/2019      Medication Review Current Outpatient Medications on File Prior to Visit  Medication Sig Dispense Refill  . Ascorbic Acid (VITAMIN C PO) Take 1,000 mg by mouth daily.     Marland Kitchen aspirin 81 MG tablet Take 81 mg by mouth daily.    Marland Kitchen atenolol (TENORMIN) 50 MG tablet TAKE ONE TABLET BY MOUTH DAILY 90 tablet 3  . Cholecalciferol (VITAMIN D) 2000 UNITS tablet Take 6,000 Units by mouth daily.     . fexofenadine (ALLEGRA) 180 MG tablet Take 180 mg by mouth daily.    Marland Kitchen MAGNESIUM OXIDE PO Take 250 mg by mouth daily.    . Melatonin 5 MG TABS Take by mouth. Patient takes 6 tablets to equal 30 mg at bedtime.    . Multiple Vitamin (MULTIVITAMIN) tablet Take 1 tablet by mouth daily.    . multivitamin-lutein (OCUVITE-LUTEIN) CAPS capsule Take 1 capsule by mouth daily.    . Omega-3 Fatty Acids (FISH OIL) 1000 MG CAPS Take 1 capsule by mouth daily.    Marland Kitchen OVER THE COUNTER MEDICATION Takes Vitamin B Complex 1 daily    . TURMERIC PO Take 2,000 mg by mouth.    . Zinc 50 MG TABS Take 100 mg by mouth daily.    Marland Kitchen ALPRAZolam (XANAX) 0.5 MG tablet Take 1 tablet (0.5 mg total) by mouth 2 (two) times daily as needed. for anxiety (Patient not taking: Reported on 02/09/2020) 60 tablet 0   No current facility-administered medications on file prior to visit.    Current Problems (verified) Patient Active Problem List   Diagnosis Date Noted  . CKD (chronic kidney disease)  stage 3, GFR 30-59 ml/min 09/21/2018  . Osteopenia 09/05/2018  . FHx: heart disease 06/18/2018  . Insomnia 12/06/2017  . SVT (supraventricular tachycardia) (HCC) 11/15/2015  . Overweight (BMI 25.0-29.9) 11/18/2014  . Medication management 04/20/2014  . Essential hypertension 10/20/2013  . Hyperlipidemia, mixed 10/20/2013  . Abnormal glucose 10/20/2013  . Vitamin D deficiency 10/20/2013  . GERD 10/20/2013  . Migraine headache 10/20/2013  . 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 DTaP 09/12/2013  . Influenza Split 09/12/2013  . Influenza, High Dose Seasonal PF 10/04/2014, 08/30/2017  . Pneumococcal Conjugate-13 10/04/2014  . Pneumococcal Polysaccharide-23 03/29/2009  . Td 12/20/2003  . Tdap 09/12/2013  . Zoster 06/07/2010    Preventative care: Last colonoscopy: 2002, 2008 norm, due has been  10 years, she requests cologuard Last mammogram: 09/2019, due for 2021 PAP/pelvic: 2019 at [facility] DEXA: 08/2018 spine T -1.5, Due for 2021 order placed today Echo 12/2015 Myocardial perfusion: 09/2018  Prior vaccinations: TD or Tdap: 2014  Influenza: 2018 - declines  Pneumococcal: 2010 Prevnar13: 2015 Shingles/Zostavax: 2011  Names of Other Physician/Practitioners you currently use: 1. [facility]- here for primary care 2. Dr. Dione Booze , eye doctor, last visit 2019, has scheduled 10/22/2019 3. Dr. Irving Copas, dentist, last visit 2020, goes q39m  Patient Care Team: Lucky Cowboy, MD as PCP - General (Internal Medicine) Wendall Stade, MD as PCP - Cardiology (Cardiology) Louis Meckel, MD (Inactive) as Consulting Physician (Gastroenterology) Tracey Harries, MD as Consulting Physician (Obstetrics and Gynecology)  Allergies Allergies  Allergen Reactions  . Penicillins Anaphylaxis  . Codeine Nausea And Vomiting  . Sudafed [Pseudoephedrine Hcl]     Makes heart race, per pt      SURGICAL HISTORY She  has a past surgical history that includes No past surgeries. FAMILY HISTORY Her family history includes Anuerysm in her sister; Breast cancer in her mother; CAD in her mother; Heart attack (age of onset: 14) in her sister; Heart disease in her mother; Kidney failure in her paternal uncle; Lung cancer in her father; Polycystic kidney disease in her paternal uncle; Stroke in her maternal grandmother. SOCIAL HISTORY She  reports that she has never smoked. She has never used smokeless tobacco. She reports that she does not drink alcohol.   Objective:   Today's Vitals   02/09/20 1056  BP: 126/70  Pulse: (!) 58  Temp: (!) 97.5 F (36.4 C)  SpO2: 97%  Weight: 142 lb 6.4 oz (64.6 kg)   Body mass index is 26.91 kg/m.  General appearance: alert, no distress, WD/WN,  female HEENT: normocephalic, sclerae anicteric, TMs pearly, nares patent, no discharge or erythema, pharynx normal Oral cavity: MMM, no lesions Neck: supple, no lymphadenopathy, no thyromegaly, no masses Heart: RRR, normal S1, S2, no murmurs Lungs: CTA bilaterally, no wheezes, rhonchi, or rales Abdomen: +bs, soft, non tender, + distended/full lower AB, no masses, no hepatomegaly, no splenomegaly Musculoskeletal: nontender, no swelling, no obvious deformity Extremities: no edema, no cyanosis, no clubbing Pulses: 2+ symmetric, upper and lower extremities, normal cap refill Neurological: alert, oriented x 3, CN2-12 intact, strength normal upper extremities and lower extremities, sensation normal throughout, DTRs 2+ throughout, no cerebellar signs, gait normal Psychiatric: normal affect, behavior normal, pleasant  Breast: defer Gyn: defer Rectal: defer     Elder Negus, NP   02/09/2020

## 2020-02-09 ENCOUNTER — Encounter: Payer: Self-pay | Admitting: Adult Health Nurse Practitioner

## 2020-02-09 ENCOUNTER — Other Ambulatory Visit: Payer: Self-pay

## 2020-02-09 ENCOUNTER — Ambulatory Visit (INDEPENDENT_AMBULATORY_CARE_PROVIDER_SITE_OTHER): Payer: Medicare Other | Admitting: Adult Health Nurse Practitioner

## 2020-02-09 ENCOUNTER — Ambulatory Visit: Payer: Medicare Other | Admitting: Adult Health

## 2020-02-09 VITALS — BP 126/70 | HR 58 | Temp 97.5°F | Wt 142.4 lb

## 2020-02-09 DIAGNOSIS — E559 Vitamin D deficiency, unspecified: Secondary | ICD-10-CM | POA: Diagnosis not present

## 2020-02-09 DIAGNOSIS — E782 Mixed hyperlipidemia: Secondary | ICD-10-CM

## 2020-02-09 DIAGNOSIS — Z8249 Family history of ischemic heart disease and other diseases of the circulatory system: Secondary | ICD-10-CM

## 2020-02-09 DIAGNOSIS — I10 Essential (primary) hypertension: Secondary | ICD-10-CM | POA: Diagnosis not present

## 2020-02-09 DIAGNOSIS — I471 Supraventricular tachycardia, unspecified: Secondary | ICD-10-CM

## 2020-02-09 DIAGNOSIS — E663 Overweight: Secondary | ICD-10-CM

## 2020-02-09 DIAGNOSIS — R7309 Other abnormal glucose: Secondary | ICD-10-CM

## 2020-02-09 DIAGNOSIS — G43909 Migraine, unspecified, not intractable, without status migrainosus: Secondary | ICD-10-CM

## 2020-02-09 DIAGNOSIS — G518 Other disorders of facial nerve: Secondary | ICD-10-CM

## 2020-02-09 DIAGNOSIS — K2100 Gastro-esophageal reflux disease with esophagitis, without bleeding: Secondary | ICD-10-CM

## 2020-02-09 DIAGNOSIS — F5101 Primary insomnia: Secondary | ICD-10-CM

## 2020-02-09 DIAGNOSIS — N183 Chronic kidney disease, stage 3 unspecified: Secondary | ICD-10-CM

## 2020-02-09 DIAGNOSIS — M8588 Other specified disorders of bone density and structure, other site: Secondary | ICD-10-CM

## 2020-02-09 DIAGNOSIS — Z79899 Other long term (current) drug therapy: Secondary | ICD-10-CM

## 2020-02-09 MED ORDER — ROSUVASTATIN CALCIUM 10 MG PO TABS: ORAL_TABLET | ORAL | 3 refills | Status: DC

## 2020-02-10 LAB — COMPLETE METABOLIC PANEL WITH GFR
AG Ratio: 1.7 (calc) (ref 1.0–2.5)
ALT: 26 U/L (ref 6–29)
AST: 32 U/L (ref 10–35)
Albumin: 4.3 g/dL (ref 3.6–5.1)
Alkaline phosphatase (APISO): 76 U/L (ref 37–153)
BUN: 14 mg/dL (ref 7–25)
CO2: 29 mmol/L (ref 20–32)
Calcium: 9.9 mg/dL (ref 8.6–10.4)
Chloride: 105 mmol/L (ref 98–110)
Creat: 0.76 mg/dL (ref 0.60–0.93)
GFR, Est African American: 90 mL/min/{1.73_m2} (ref >=60)
GFR, Est Non African American: 78 mL/min/{1.73_m2} (ref >=60)
Globulin: 2.5 g/dL (calc) (ref 1.9–3.7)
Glucose, Bld: 90 mg/dL (ref 65–99)
Potassium: 5 mmol/L (ref 3.5–5.3)
Sodium: 141 mmol/L (ref 135–146)
Total Bilirubin: 0.4 mg/dL (ref 0.2–1.2)
Total Protein: 6.8 g/dL (ref 6.1–8.1)

## 2020-02-10 LAB — CBC WITH DIFFERENTIAL/PLATELET
Absolute Monocytes: 616 cells/uL (ref 200–950)
Basophils Absolute: 60 cells/uL (ref 0–200)
Basophils Relative: 0.9 %
Eosinophils Absolute: 87 cells/uL (ref 15–500)
Eosinophils Relative: 1.3 %
HCT: 44 % (ref 35.0–45.0)
Hemoglobin: 14.5 g/dL (ref 11.7–15.5)
Lymphs Abs: 884 cells/uL (ref 850–3900)
MCH: 29.7 pg (ref 27.0–33.0)
MCHC: 33 g/dL (ref 32.0–36.0)
MCV: 90.2 fL (ref 80.0–100.0)
MPV: 9.4 fL (ref 7.5–12.5)
Monocytes Relative: 9.2 %
Neutro Abs: 5052 cells/uL (ref 1500–7800)
Neutrophils Relative %: 75.4 %
Platelets: 290 10*3/uL (ref 140–400)
RBC: 4.88 10*6/uL (ref 3.80–5.10)
RDW: 12.3 % (ref 11.0–15.0)
Total Lymphocyte: 13.2 %
WBC: 6.7 10*3/uL (ref 3.8–10.8)

## 2020-02-10 LAB — LIPID PANEL
Cholesterol: 154 mg/dL (ref <200)
HDL: 77 mg/dL (ref >=50)
LDL Cholesterol (Calc): 62 mg/dL (calc)
Non-HDL Cholesterol (Calc): 77 mg/dL (calc) (ref <130)
Total CHOL/HDL Ratio: 2 (calc) (ref <5.0)
Triglycerides: 66 mg/dL (ref <150)

## 2020-02-10 LAB — MAGNESIUM: Magnesium: 2.1 mg/dL (ref 1.5–2.5)

## 2020-02-10 LAB — VITAMIN D 25 HYDROXY (VIT D DEFICIENCY, FRACTURES): Vit D, 25-Hydroxy: 72 ng/mL (ref 30–100)

## 2020-02-15 ENCOUNTER — Encounter: Payer: Self-pay | Admitting: Adult Health Nurse Practitioner

## 2020-02-15 NOTE — Addendum Note (Signed)
Addended byElder Negus A on: 02/15/2020 11:40 AM   Modules accepted: Orders

## 2020-03-14 ENCOUNTER — Other Ambulatory Visit: Payer: Self-pay | Admitting: *Deleted

## 2020-03-14 ENCOUNTER — Encounter: Payer: Self-pay | Admitting: Physician Assistant

## 2020-03-14 ENCOUNTER — Other Ambulatory Visit: Payer: Self-pay

## 2020-03-14 ENCOUNTER — Ambulatory Visit (INDEPENDENT_AMBULATORY_CARE_PROVIDER_SITE_OTHER): Payer: Medicare Other | Admitting: Physician Assistant

## 2020-03-14 VITALS — BP 136/82 | HR 70 | Temp 97.3°F | Wt 143.0 lb

## 2020-03-14 DIAGNOSIS — R42 Dizziness and giddiness: Secondary | ICD-10-CM

## 2020-03-14 DIAGNOSIS — R519 Headache, unspecified: Secondary | ICD-10-CM

## 2020-03-14 MED ORDER — PROMETHAZINE HCL 25 MG PO TABS: 25.0000 mg | ORAL_TABLET | Freq: Four times a day (QID) | ORAL | 0 refills | Status: DC | PRN

## 2020-03-14 MED ORDER — PREDNISONE 20 MG PO TABS: ORAL_TABLET | ORAL | 1 refills | Status: DC

## 2020-03-14 NOTE — Progress Notes (Signed)
Subjective:    Patient ID: Jean Drake, female    DOB: 21-Apr-1946, 74 y.o.   MRN: [id number]  HPI 74 y.o. non smoking WF with history of HTN, chol, preDM, migraine HA, facial neuralgia, SVT, CKD stage 3 presents with vertigo. She had had vertigo in the past, feels similar, last one was Jan 2020. She took prednisone this AM, last one from last visit.   She states yesterday during the day she felt funny in her head, and then last night she had HA, then she laid down to sleep.  She felt pressure like a band around her head, feels she has a block around her head, nausea.  With standing or turning her head to quickly or quick movements she will have imbalance and the sensation of movement. She has had some left ear fullness. She is sneezing, takes allegra daily, no fever or chills.  She has been mowing more and being out side more. States pollen can trigger the vertigo.  No sensitivity to light or sound.   Has been taking travel sickness medication.   She denies any associated neurological complications or symptoms, such as one-sided weakness, numbness, tingling, slurring of speech, droopy face, swallowing difficulties, diplopia, vision loss, hearing loss or tinnitus. No palpitations, SOB, CP.   She is on bASA. She had normal cardiac calcium score in 2019, normal stress test 2019.  No CT or MRI of brain on file.   Blood pressure 136/82, pulse 70, temperature (!) 97.3 F (36.3 C), weight 143 lb (64.9 kg), SpO2 97 %.  Medications  Current Outpatient Medications (Endocrine & Metabolic):  .  predniSONE (DELTASONE) 20 MG tablet, 2 tablets daily for 3 days, 1 tablet daily for 4 days.  Current Outpatient Medications (Cardiovascular):  .  atenolol (TENORMIN) 50 MG tablet, TAKE ONE TABLET BY MOUTH DAILY .  rosuvastatin (CRESTOR) 10 MG tablet, Take 1 tablet Daily for Cholesterol  Current Outpatient Medications (Respiratory):  .  fexofenadine (ALLEGRA) 180 MG tablet, Take 180 mg by mouth daily. .   promethazine (PHENERGAN) 25 MG tablet, Take 1 tablet (25 mg total) by mouth every 6 (six) hours as needed for nausea or vomiting (can cause fatigue). Max: 4 tablets per day  Current Outpatient Medications (Analgesics):  .  aspirin 81 MG tablet, Take 81 mg by mouth daily.   Current Outpatient Medications (Other):  Marland Kitchen  Ascorbic Acid (VITAMIN C PO), Take 1,000 mg by mouth daily.  .  Cholecalciferol (VITAMIN D) 2000 UNITS tablet, Take 6,000 Units by mouth daily.  Marland Kitchen  MAGNESIUM OXIDE PO*, Take 250 mg by mouth daily. .  Melatonin 5 MG TABS, Take by mouth. Patient takes 6 tablets to equal 30 mg at bedtime. .  Multiple Vitamin (MULTIVITAMIN) tablet, Take 1 tablet by mouth daily. .  multivitamin-lutein (OCUVITE-LUTEIN) CAPS capsule, Take 1 capsule by mouth daily. .  Omega-3 Fatty Acids (FISH OIL) 1000 MG CAPS, Take 1 capsule by mouth daily. Marland Kitchen  OVER THE COUNTER MEDICATION, Takes Vitamin B Complex 1 daily .  traZODone (DESYREL) 50 MG tablet, Take 50 mg by mouth at bedtime. .  TURMERIC PO, Take 2,000 mg by mouth. .  Zinc 50 MG TABS, Take 100 mg by mouth daily. * These medications belong to multiple therapeutic classes and are listed under each applicable group.  Problem list She has Essential hypertension; Hyperlipidemia, mixed; Abnormal glucose; Vitamin D deficiency; GERD; Migraine headache; Facial neuralgia; Medication management; Overweight (BMI 25.0-29.9); SVT (supraventricular tachycardia) (HCC); Insomnia; FHx: heart disease;  Osteopenia; and CKD (chronic kidney disease) stage 3, GFR 30-59 ml/min on their problem list.  Review of Systems  Constitutional: Negative.  Negative for chills, fatigue and fever.  HENT: Positive for congestion and tinnitus. Negative for dental problem, drooling, ear discharge, ear pain, facial swelling, hearing loss, mouth sores, nosebleeds, postnasal drip, rhinorrhea, sinus pressure, sneezing, sore throat, trouble swallowing and voice change.   Eyes: Negative.  Negative for  visual disturbance.  Respiratory: Negative.   Cardiovascular: Negative.   Gastrointestinal: Negative.   Musculoskeletal: Negative.  Negative for neck pain and neck stiffness.  Neurological: Positive for dizziness. Negative for tremors, seizures, syncope, facial asymmetry, speech difficulty, weakness, light-headedness, numbness and headaches.  Psychiatric/Behavioral: Negative.        Objective:   Physical Exam Constitutional:      Appearance: She is well-developed.  HENT:     Head: Normocephalic and atraumatic.     Right Ear: Hearing and external ear normal. A middle ear effusion is present. No mastoid tenderness. Tympanic membrane is not injected, erythematous, retracted or bulging.     Left Ear: Hearing and external ear normal. A middle ear effusion is present. No mastoid tenderness. Tympanic membrane is not injected, erythematous, retracted or bulging.  Eyes:     Conjunctiva/sclera: Conjunctivae normal.     Pupils: Pupils are equal, round, and reactive to light.  Cardiovascular:     Rate and Rhythm: Normal rate.  Pulmonary:     Effort: Pulmonary effort is normal.     Breath sounds: Normal breath sounds.  Musculoskeletal:        General: Normal range of motion.     Cervical back: Normal range of motion and neck supple.  Skin:    General: Skin is warm and dry.  Neurological:     Mental Status: She is alert and oriented to person, place, and time.        Assessment & Plan:  Jean Drake was seen today for dizziness.  Diagnoses and all orders for this visit:  Acute nonintractable headache, unspecified headache type Vertigo ? Sinus component -nonsmoker, normal neuro, does have history of vertigo in the past, OTC phenergan PRN, exercises given, continue bASA, if worsening HA, changes vision/speech, imbalance, weakness go to the ER  -     predniSONE (DELTASONE) 20 MG tablet; 2 tablets daily for 3 days, 1 tablet daily for 4 days. -     promethazine (PHENERGAN) 25 MG tablet; Take 1  tablet (25 mg total) by mouth every 6 (six) hours as needed for nausea or vomiting (can cause fatigue). Max: 4 tablets per day  Future Appointments  Date Time Provider [place]  04/04/2020 11:45 AM Burtis Junes, NP [facility]  05/23/2020 11:30 AM Unk Pinto, MD [facility] None  07/12/2020 11:30 AM Garnet Sierras, NP [facility] None  12/01/2020 10:00 AM Unk Pinto, MD [facility] None

## 2020-03-14 NOTE — Patient Instructions (Signed)
Benign Paroxysmal Positional Vertigo (BPPV)  General Information In Benign Paroxysmal Positional Vertigo (BPPV) dizziness is generally thought to be due to debris which has collected within a part of the inner ear. This debris can be thought of as "ear rocks", although the formal name is "otoconia". Ear rocks are small crystals of calcium carbonate derived from a structure in the ear called the "utricle" (figure to the right ). The symptoms of BPPV include dizziness or vertigo, lightheadedness, imbalance, and nausea. Activities which bring on symptoms will vary among persons, but symptoms are usually followed by a change of position of the head like getting out of bed or rolling over in bed are common "problem" motions .  However if you have worsening HA, changes vision/speech, weakness go to the ER     What can be done? 1) Use two or more pillows at night. Avoid sleeping on the "bad" side. In the morning, get up slowly and sit on the edge of the bed for a minute. 2) Medication prescribed by your doctor.  3) The exercises below, you can do at home to help you prevent the sensation later in the day.  4) We can refer you to physical therapy at [facility], # [phone]  Home treatments: The Brandt-Daroff Exercises are a home method of treating BPPV,and are effective 95% of the time.  These exercises are performed in three sets per day for two weeks. In each set, one performs the maneuver as shown five times. Start sitting upright (position 1). Then move into the side-lying position (position 2), with the head angled upward about halfway. An easy way to remember this is to imagine someone standing about 6 feet in front of you, and just keep looking at their head at all times. Stay in the side-lying position for 30 seconds, or until the dizziness subsides if this is longer, then go back to the sitting position (position 3). Stay there for 30 seconds, and then go to the opposite side  (position 4) and follow the same routine.  At home Epley Maneuver This procedure seems to be even more effective than the in-office procedure, perhaps because it is repeated every night for a week.  The method (for the left side) is performed as shown on the figure below.  1) One stays in each of the supine (lying down) positions for 30 seconds, and in the sitting upright position (top) for 1 minute.  2) Thus, once cycle takes 2 1/2 minutes.  3) Typically 3 cycles are performed just prior to going to sleep.  4) It is best to do them at night rather than in the morning or midday, as if one becomes dizzy following the exercises, then it can resolve while one is sleeping.  The mirror image of this procedure is used for the right ear.  

## 2020-03-22 DIAGNOSIS — Z0120 Encounter for dental examination and cleaning without abnormal findings: Secondary | ICD-10-CM | POA: Diagnosis not present

## 2020-03-30 NOTE — Progress Notes (Signed)
CARDIOLOGY OFFICE NOTE  Date:  04/04/2020    Jean Drake Date of Birth: 05-11-1946 Medical Record #[id number]  PCP:  Lucky Cowboy, MD  Cardiologist:  Eden Emms   Chief Complaint  Patient presents with  . Follow-up    History of Present Illness: Jean Drake is a 74 y.o. female who presents today for a work in visit. Seen for Dr. Eden Emms.   She has a history of HTN, HLD, borderline DM and SVT in the past that converted with adenosine. She has seen EP - Dr. Elberta Fortis - not interested in ablation and opted for continued beta blocker therapy. FH + for CAD - sister with "widow maker" and mother has had stents placed.   Last seen in November of 2019. Myoview was obtained - low risk.   The patient does not have symptoms concerning for COVID-19 infection (fever, chills, cough, or new shortness of breath).   Comes in today. Here alone. She has done fine since her last visit here until last week. She may have some occasional flutters since she was last here, but nothing that bothered her. She notes that her husband died in a farm accident last year - he had dementia but died in an accident with the bush hog.  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 She started drinking glass after glass of water. She ate - she ended up taking her medicines (typically takes at dinner) - then felt better. She has done fine since.   Past Medical History:  Diagnosis Date  . Essential hypertension 10/20/2013  .  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 GERD 10/20/2013  . Hyperlipidemia 10/20/2013  . Migraine, unspecified, without mention of intractable migraine without mention of status migrainosus 10/20/2013  . Morbid obesity (HCC) 11/18/2014  . Prediabetes 10/20/2013  . SVT (supraventricular tachycardia) (HCC) 11/15/2015  . Vitamin D deficiency 10/20/2013    Past Surgical History:  Procedure Laterality Date  . NO PAST SURGERIES       Medications: Current Meds  Medication Sig  . Ascorbic Acid (VITAMIN C PO) Take 1,000 mg by mouth daily.   Marland Kitchen aspirin 81 MG tablet Take 81 mg by mouth daily.  Marland Kitchen atenolol (TENORMIN) 50 MG tablet TAKE ONE TABLET BY MOUTH DAILY  . Cholecalciferol (VITAMIN D) 2000 UNITS tablet Take 6,000 Units by mouth daily.   . fexofenadine (ALLEGRA) 180 MG tablet Take 180 mg by mouth daily.  Marland Kitchen MAGNESIUM OXIDE PO Take 250 mg by mouth daily.  . Melatonin 5 MG TABS Take by mouth. Patient takes 6 tablets to equal 30 mg at bedtime.  . Multiple Vitamin (MULTIVITAMIN) tablet Take 1 tablet by mouth daily.  . multivitamin-lutein (OCUVITE-LUTEIN) CAPS capsule Take 1 capsule by mouth daily.  . Omega-3 Fatty Acids (FISH OIL) 1000 MG CAPS Take 1 capsule by mouth daily.  Marland Kitchen OVER THE COUNTER MEDICATION Takes  Vitamin B Complex 1 daily  . rosuvastatin (CRESTOR) 10 MG tablet Take 1 tablet Daily for Cholesterol  . traZODone (DESYREL) 50 MG tablet Take 50 mg by mouth at bedtime as needed.   . TURMERIC PO Take 2,000 mg by mouth.  . Zinc 50 MG TABS Take 100 mg by mouth daily.     Allergies: Allergies  Allergen Reactions  . Penicillins Anaphylaxis  . Codeine Nausea And Vomiting  . Sudafed [Pseudoephedrine Hcl]     Makes heart race, per pt     Social History: The patient  reports that she has never smoked. She has never used smokeless tobacco. She reports that she does not drink alcohol.   Family History: The patient's family history includes Anuerysm in her sister; Breast cancer in her mother; CAD in her  mother; Heart attack (age of onset: 40) in her sister; Heart disease in her mother; Kidney failure in her paternal uncle; Lung cancer in her father; Polycystic kidney disease in her paternal uncle; Stroke in her maternal grandmother.   Review of Systems: Please see the history of present illness.   All other systems are reviewed and negative.   Physical Exam: VS:  BP 138/86   Pulse 77   Ht 5\' 1"  (1.549 m)   Wt 141 lb 12.8 oz (64.3 kg)   SpO2 97%   BMI 26.79 kg/m  .  BMI Body mass index is 26.79 kg/m.  Wt Readings from Last 3 Encounters:  04/04/20 141 lb 12.8 oz (64.3 kg)  03/14/20 143 lb (64.9 kg)  02/09/20 142 lb 6.4 oz (64.6 kg)    General: Very pleasant, alert and in no acute distress.   Cardiac: Regular rate and rhythm. No murmurs, rubs, or gallops. No edema.  Respiratory:  Lungs are clear to auscultation bilaterally with normal work of breathing.  GI: Soft and nontender.  MS: No deformity or atrophy. Gait and ROM intact.  Skin: Warm and dry. Color is normal.  Neuro:  Strength and sensation are intact and no gross focal deficits noted.  Psych: Alert, appropriate and with normal affect.   LABORATORY DATA:  EKG:  EKG is ordered today. This is personally reviewed by me. This demonstrates NSR with no acute changes.   Lab Results  Component Value Date   WBC 6.7 02/09/2020   HGB 14.5 02/09/2020   HCT 44.0 02/09/2020   PLT 290 02/09/2020   GLUCOSE 90 02/09/2020   CHOL 154 02/09/2020   TRIG 66 02/09/2020   HDL 77 02/09/2020   LDLCALC 62 02/09/2020   ALT 26 02/09/2020   AST 32 02/09/2020   NA 141 02/09/2020   K 5.0 02/09/2020   CL 105 02/09/2020   CREATININE 0.76 02/09/2020   BUN 14 02/09/2020   CO2 29 02/09/2020   TSH 1.31 11/11/2019   HGBA1C 5.5 11/11/2019   MICROALBUR <0.2 11/11/2019     BNP (last 3 results) No results for input(s): BNP in the last 8760 hours.  ProBNP (last 3 results) No results for input(s): PROBNP in the last 8760 hours.   Other  Studies Reviewed Today:  Myoview Study Highlights 09/2018    Nuclear stress EF: 74%.  Blood pressure demonstrated a normal response to exercise.  There was no ST segment deviation noted during stress.  The study is normal.  This is a low risk study.  The left ventricular ejection fraction is hyperdynamic (>65%).   Normal stress nuclear study with no ischemia or infarction; EF 74 with normal wall  motion.     Assessment/Plan:  1. Episode of palpitations - she did not feel like this was her SVT - I suspect this was multifactorial - had not eaten, no water, it was hot, etc. We will check some baseline lab today - if recurs, can place monitor. She is happy with this plan. She understands the need for hydration/use salt, and pace her self.   2. Prior SVT - no structural heart issues by her echo - she has not wished to have ablation and still does not wish to pursue.   3. FH + for CAD - on statin therapy and aspirin. Prior low risk Myoview.   4. HLD - on statin - lab from March noted.   5. COVID-19 Education: The signs and symptoms of COVID-19 were discussed with the patient and how to seek care for testing (follow up with PCP or arrange E-visit).  The importance of social distancing, staying at home, hand hygiene and wearing a mask when out in public were discussed today.  Current medicines are reviewed with the patient today.  The patient does not have concerns regarding medicines other than what has been noted above.  The following changes have been made:  See above.  Labs/ tests ordered today include:    Orders Placed This Encounter  Procedures  . Basic metabolic panel  . CBC  . TSH  . EKG 12-Lead     Disposition:   FU with [place] as needed - if she has recurrence we will place a monitor.    Patient is agreeable to this plan and will call if any problems develop in the interim.   SignedTruitt Merle, NP  04/04/2020 12:52 PM  [facility] [address] [place], [place]  [address] Phone: [phone] Fax: [phone]

## 2020-04-04 ENCOUNTER — Encounter: Payer: Self-pay | Admitting: Nurse Practitioner

## 2020-04-04 ENCOUNTER — Ambulatory Visit: Payer: Medicare Other | Admitting: Nurse Practitioner

## 2020-04-04 ENCOUNTER — Other Ambulatory Visit: Payer: Self-pay

## 2020-04-04 VITALS — BP 138/86 | HR 77 | Ht 61.0 in | Wt 141.8 lb

## 2020-04-04 DIAGNOSIS — I471 Supraventricular tachycardia: Secondary | ICD-10-CM

## 2020-04-04 NOTE — Patient Instructions (Addendum)
After Visit Summary:  We will be checking the following labs today - BMET, CBC and TSH   Medication Instructions:    Continue with your current medicines.    If you need a refill on your cardiac medications before your next appointment, please call your pharmacy.     Testing/Procedures To Be Arranged:  N/A  Follow-Up:   See [place] back as needed.     At [facility], you and your health needs are our priority.  As part of our continuing mission to provide you with exceptional heart care, we have created designated Provider Care Teams.  These Care Teams include your primary Cardiologist (physician) and Advanced Practice Providers (APPs -  Physician Assistants and Nurse Practitioners) who all work together to provide you with the care you need, when you need it.  Special Instructions:  . Stay safe, stay home, wash your hands for at least 20 seconds and wear a mask when out in public.  . It was good to talk with you today.  . Think about what we talked about today.  . We will place a monitor if you have another spell   Call the [facility] office at [phone] if you have any questions, problems or concerns.

## 2020-04-05 LAB — BASIC METABOLIC PANEL
BUN/Creatinine Ratio: 16 (ref 12–28)
BUN: 14 mg/dL (ref 8–27)
CO2: 22 mmol/L (ref 20–29)
Calcium: 9.8 mg/dL (ref 8.7–10.3)
Chloride: 104 mmol/L (ref 96–106)
Creatinine, Ser: 0.85 mg/dL (ref 0.57–1.00)
GFR calc Af Amer: 79 mL/min/{1.73_m2} (ref >59)
GFR calc non Af Amer: 68 mL/min/{1.73_m2} (ref >59)
Glucose: 98 mg/dL (ref 65–99)
Potassium: 4.5 mmol/L (ref 3.5–5.2)
Sodium: 139 mmol/L (ref 134–144)

## 2020-04-05 LAB — TSH: TSH: 1.11 u[IU]/mL (ref 0.450–4.500)

## 2020-04-05 LAB — CBC
Hematocrit: 41.8 % (ref 34.0–46.6)
Hemoglobin: 14.4 g/dL (ref 11.1–15.9)
MCH: 30.8 pg (ref 26.6–33.0)
MCHC: 34.4 g/dL (ref 31.5–35.7)
MCV: 90 fL (ref 79–97)
Platelets: 295 10*3/uL (ref 150–450)
RBC: 4.67 x10E6/uL (ref 3.77–5.28)
RDW: 12.2 % (ref 11.7–15.4)
WBC: 5.1 10*3/uL (ref 3.4–10.8)

## 2020-04-19 DIAGNOSIS — H25813 Combined forms of age-related cataract, bilateral: Secondary | ICD-10-CM | POA: Diagnosis not present

## 2020-04-19 DIAGNOSIS — H04123 Dry eye syndrome of bilateral lacrimal glands: Secondary | ICD-10-CM | POA: Diagnosis not present

## 2020-05-11 ENCOUNTER — Ambulatory Visit: Payer: Medicare Other | Admitting: Internal Medicine

## 2020-05-22 ENCOUNTER — Encounter: Payer: Self-pay | Admitting: Internal Medicine

## 2020-05-22 NOTE — Progress Notes (Addendum)
History of Present Illness:       This very nice 74 y.o.  [name] presents for 6 month follow up with HTN, pSVT,  HLD, Pre-Diabetes and Vitamin D Deficiency.       Patient reports occasional positional vertigo usu weel controlled with prn meclizine and also usually takes 2-3 prednisone with each flare-up.       Patient is treated for HTN (2012) & BP has been controlled at home. Today's BP is at goal -  122/60.   Patient has hx/o pSVT predating from 2012  req Adsenosine in ER for CV in 2012 & 2017.  2D-echo was WNL.  Myoview in 2019 was low risk with EF 74%.  Patient has had no complaints of any cardiac type chest pain, palpitations, dyspnea / orthopnea / PND, dizziness, claudication, or dependent edema.      Hyperlipidemia is controlled with diet & meds. Patient denies myalgias or other med SE's. Last Lipids were at goal:  Lab Results  Component Value Date   CHOL 154 02/09/2020   HDL 77 02/09/2020   LDLCALC 62 02/09/2020   TRIG 66 02/09/2020   CHOLHDL 2.0 02/09/2020    Also, the patient is mildly overweight  (BMI 27+) and is monitored for glucose intolerance and has had no symptoms of reactive hypoglycemia, diabetic polys, paresthesias or visual blurring.  Last A1c was Normal & at goal:  Lab Results  Component Value Date   HGBA1C 5.5 11/11/2019           Further, the patient also has history of Vitamin D Deficiency("9"/2009) and supplements vitamin D without any suspected side-effects. Last vitamin D was at goal:  Lab Results  Component Value Date   VD25OH 72 02/09/2020    Current Outpatient Medications on File Prior to Visit  Medication Sig  . Ascorbic Acid (VITAMIN C PO) Take 1,000 mg by mouth daily.   Marland Kitchen aspirin 81 MG tablet Take 81 mg by mouth daily.  Marland Kitchen atenolol (TENORMIN) 50 MG tablet TAKE ONE TABLET BY MOUTH DAILY  . Cholecalciferol (VITAMIN D) 2000 UNITS tablet Take 6,000 Units by mouth daily.   . fexofenadine (ALLEGRA) 180 MG tablet Take 180 mg by mouth daily.    Marland Kitchen MAGNESIUM OXIDE PO Take 250 mg by mouth daily.  . Melatonin 5 MG TABS Take by mouth. Patient takes 6 tablets to equal 30 mg at bedtime.  . Multiple Vitamin (MULTIVITAMIN) tablet Take 1 tablet by mouth daily.  . multivitamin-lutein (OCUVITE-LUTEIN) CAPS capsule Take 1 capsule by mouth daily.  . Omega-3 Fatty Acids (FISH OIL) 1000 MG CAPS Take 1 capsule by mouth daily.  Marland Kitchen OVER THE COUNTER MEDICATION Takes Vitamin B Complex 1 daily  . rosuvastatin (CRESTOR) 10 MG tablet Take 1 tablet Daily for Cholesterol  . traZODone (DESYREL) 50 MG tablet Take 50 mg by mouth at bedtime as needed.   . TURMERIC PO Take 2,000 mg by mouth.  . Zinc 50 MG TABS Take 100 mg by mouth daily.   No current facility-administered medications on file prior to visit.    Allergies  Allergen Reactions  . Penicillins Anaphylaxis  . Codeine Nausea And Vomiting  . Sudafed [Pseudoephedrine Hcl]     Makes heart race, per pt     PMHx:   Past Medical History:  Diagnosis Date  . Essential hypertension 10/20/2013  . 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 GERD 10/20/2013  . Hyperlipidemia 10/20/2013  . Migraine, unspecified, without mention of intractable migraine  without mention of status migrainosus 10/20/2013  . Morbid obesity (HCC) 11/18/2014  . Prediabetes 10/20/2013  . SVT (supraventricular tachycardia) (HCC) 11/15/2015  . Vitamin D deficiency 10/20/2013    Immunization History  Administered Date(s) Administered  . DTaP 09/12/2013  . Influenza Split 09/12/2013  . Influenza, High Dose Seasonal PF 10/04/2014, 08/30/2017  . Pneumococcal Conjugate-13 10/04/2014  . Pneumococcal Polysaccharide-23 03/29/2009  . Td 12/20/2003  . Tdap 09/12/2013  . Zoster 06/07/2010    . NO PAST SURGERIES      FHx:    Reviewed / unchanged  SHx:    Reviewed / unchanged   Systems Review:  Constitutional: Denies fever, chills, wt changes, headaches, insomnia, fatigue, night sweats, change in appetite. Eyes: Denies redness, blurred vision,  diplopia, discharge, itchy, watery eyes.  ENT: Denies discharge, congestion, post nasal drip, epistaxis, sore throat, earache, hearing loss, dental pain, tinnitus, vertigo, sinus pain, snoring.  CV: Denies chest pain, palpitations, irregular heartbeat, syncope, dyspnea, diaphoresis, orthopnea, PND, claudication or edema. Respiratory: denies cough, dyspnea, DOE, pleurisy, hoarseness, laryngitis, wheezing.  Gastrointestinal: Denies dysphagia, odynophagia, heartburn, reflux, water brash, abdominal pain or cramps, nausea, vomiting, bloating, diarrhea, constipation, hematemesis, melena, hematochezia  or hemorrhoids. Genitourinary: Denies dysuria, frequency, urgency, nocturia, hesitancy, discharge, hematuria or flank pain. Musculoskeletal: Denies arthralgias, myalgias, stiffness, jt. swelling, pain, limping or strain/sprain.  Skin: Denies pruritus, rash, hives, warts, acne, eczema or change in skin lesion(s). Neuro: No weakness, tremor, incoordination, spasms, paresthesia or pain. Psychiatric: Denies confusion, memory loss or sensory loss. Endo: Denies change in weight, skin or hair change.  Heme/Lymph: No excessive bleeding, bruising or enlarged lymph nodes.  Physical Exam  BP 122/60   Pulse 68   Temp (!) 97.2 F (36.2 C)   Resp 16   Ht 5\' 1"  (1.549 m)   Wt 141 lb 6.4 oz (64.1 kg)   BMI 26.72 kg/m   Appears  well nourished, well groomed  and in no distress.  Eyes: PERRLA, EOMs, conjunctiva no swelling or erythema. Sinuses: No frontal/maxillary tenderness ENT/Mouth: EAC's clear, TM's nl w/o erythema, bulging. Nares clear w/o erythema, swelling, exudates. Oropharynx clear without erythema or exudates. Oral hygiene is good. Tongue normal, non obstructing. Hearing intact.  Neck: Supple. Thyroid not palpable. Car 2+/2+ without bruits, nodes or JVD. Chest: Respirations nl with BS clear & equal w/o rales, rhonchi, wheezing or stridor.  Cor: Heart sounds normal w/ regular rate and rhythm without  sig. murmurs, gallops, clicks or rubs. Peripheral pulses normal and equal  without edema.  Abdomen: Soft & bowel sounds normal. Non-tender w/o guarding, rebound, hernias, masses or organomegaly.  Lymphatics: Unremarkable.  Musculoskeletal: Full ROM all peripheral extremities, joint stability, 5/5 strength and normal gait.  Skin: Warm, dry without exposed rashes, lesions or ecchymosis apparent.  Neuro: Cranial nerves intact, reflexes equal bilaterally. Sensory-motor testing grossly intact. Tendon reflexes grossly intact.  Pysch: Alert & oriented x 3.  Insight and judgement nl & appropriate. No ideations.  Assessment and Plan:  1. Essential hypertension  - Continue medication, monitor blood pressure at home.  - Continue DASH diet.  Reminder to go to the ER if any CP,  SOB, nausea, dizziness, severe HA, changes vision/speech.  - COMPLETE METABOLIC PANEL WITH GFR - Magnesium  2. Hyperlipidemia, mixed  - Continue diet/meds, exercise,& lifestyle modifications.  - Continue monitor periodic cholesterol/liver & renal functions   - Lipid panel  3. Abnormal glucose  - Continue diet, exercise  - Lifestyle modifications.  - Monitor appropriate labs.  - Hemoglobin  A1c - Insulin, random  4. Vitamin D deficiency   - Continue supplementation.  - VITAMIN D 25 Hydroxy  5. Medication management  - COMPLETE METABOLIC PANEL WITH GFR - Magnesium - Lipid panel - Hemoglobin A1c - Insulin, random - VITAMIN D 25 Hydroxy        Discussed  regular exercise, BP monitoring, weight control to achieve/maintain BMI less than 25 and discussed med and SE's. Recommended labs to assess and monitor clinical status with further disposition pending results of labs.  I discussed the assessment and treatment plan with the patient. The patient was provided an opportunity to ask questions and all were answered. The patient agreed with the plan and demonstrated an understanding of the instructions.  I provided  over 30 minutes of exam, counseling, chart review and  complex critical decision making.        The patient was advised to call back or seek an in-person evaluation if the symptoms worsen or if the condition fails to improve as anticipated.   Marinus Maw, MD

## 2020-05-22 NOTE — Patient Instructions (Signed)

## 2020-05-23 ENCOUNTER — Ambulatory Visit (INDEPENDENT_AMBULATORY_CARE_PROVIDER_SITE_OTHER): Payer: Medicare Other | Admitting: Internal Medicine

## 2020-05-23 ENCOUNTER — Other Ambulatory Visit: Payer: Self-pay

## 2020-05-23 VITALS — BP 122/60 | HR 68 | Temp 97.2°F | Resp 16 | Ht 61.0 in | Wt 141.4 lb

## 2020-05-23 DIAGNOSIS — Z79899 Other long term (current) drug therapy: Secondary | ICD-10-CM

## 2020-05-23 DIAGNOSIS — E559 Vitamin D deficiency, unspecified: Secondary | ICD-10-CM | POA: Diagnosis not present

## 2020-05-23 DIAGNOSIS — E782 Mixed hyperlipidemia: Secondary | ICD-10-CM

## 2020-05-23 DIAGNOSIS — I10 Essential (primary) hypertension: Secondary | ICD-10-CM

## 2020-05-23 DIAGNOSIS — R42 Dizziness and giddiness: Secondary | ICD-10-CM

## 2020-05-23 DIAGNOSIS — R7309 Other abnormal glucose: Secondary | ICD-10-CM

## 2020-05-23 MED ORDER — DEXAMETHASONE 4 MG PO TABS: ORAL_TABLET | ORAL | 1 refills | Status: DC

## 2020-05-23 MED ORDER — MECLIZINE HCL 25 MG PO TABS: ORAL_TABLET | ORAL | 11 refills | Status: DC

## 2020-05-24 NOTE — Progress Notes (Signed)
=========================================================  -    Total Chol = 136 and LDL Chol = 48 - Both  "super"  Excellent   - Very low risk for Heart Attack  / Stroke =========================================================  - A1c - Normal - Great - No Diabetes ==========================================================  -  Vitamin D = 70 - Excellent  ==========================================================  -  All Else - CBC - Kidneys - Electrolytes - Liver - Magnesium & Thyroid    - all  Normal / OK ==========================================================              -

## 2020-05-25 LAB — VITAMIN D 25 HYDROXY (VIT D DEFICIENCY, FRACTURES): Vit D, 25-Hydroxy: 70 ng/mL (ref 30–100)

## 2020-05-25 LAB — COMPLETE METABOLIC PANEL WITH GFR
AG Ratio: 2 (calc) (ref 1.0–2.5)
ALT: 26 U/L (ref 6–29)
AST: 23 U/L (ref 10–35)
Albumin: 4.1 g/dL (ref 3.6–5.1)
Alkaline phosphatase (APISO): 75 U/L (ref 37–153)
BUN: 12 mg/dL (ref 7–25)
CO2: 27 mmol/L (ref 20–32)
Calcium: 9.7 mg/dL (ref 8.6–10.4)
Chloride: 107 mmol/L (ref 98–110)
Creat: 0.88 mg/dL (ref 0.60–0.93)
GFR, Est African American: 75 mL/min/{1.73_m2} (ref >=60)
GFR, Est Non African American: 65 mL/min/{1.73_m2} (ref >=60)
Globulin: 2.1 g/dL (calc) (ref 1.9–3.7)
Glucose, Bld: 98 mg/dL (ref 65–99)
Potassium: 4.7 mmol/L (ref 3.5–5.3)
Sodium: 138 mmol/L (ref 135–146)
Total Bilirubin: 0.5 mg/dL (ref 0.2–1.2)
Total Protein: 6.2 g/dL (ref 6.1–8.1)

## 2020-05-25 LAB — LIPID PANEL
Cholesterol: 136 mg/dL (ref <200)
HDL: 72 mg/dL (ref >=50)
LDL Cholesterol (Calc): 48 mg/dL (calc)
Non-HDL Cholesterol (Calc): 64 mg/dL (calc) (ref <130)
Total CHOL/HDL Ratio: 1.9 (calc) (ref <5.0)
Triglycerides: 80 mg/dL (ref <150)

## 2020-05-25 LAB — MAGNESIUM: Magnesium: 2 mg/dL (ref 1.5–2.5)

## 2020-05-25 LAB — HEMOGLOBIN A1C
Hgb A1c MFr Bld: 5.3 % of total Hgb (ref <5.7)
Mean Plasma Glucose: 105 (calc)
eAG (mmol/L): 5.8 (calc)

## 2020-05-25 LAB — INSULIN, RANDOM: Insulin: 6.4 u[IU]/mL

## 2020-06-14 ENCOUNTER — Other Ambulatory Visit: Payer: Self-pay | Admitting: Adult Health Nurse Practitioner

## 2020-06-14 DIAGNOSIS — Z1231 Encounter for screening mammogram for malignant neoplasm of breast: Secondary | ICD-10-CM

## 2020-06-25 ENCOUNTER — Other Ambulatory Visit: Payer: Self-pay | Admitting: Adult Health

## 2020-07-12 ENCOUNTER — Ambulatory Visit: Payer: Medicare Other | Admitting: Adult Health

## 2020-07-12 ENCOUNTER — Ambulatory Visit: Payer: Medicare Other | Admitting: Adult Health Nurse Practitioner

## 2020-08-31 NOTE — Progress Notes (Signed)
MEDICARE ANNUAL WELLNESS VISIT AND FOLLOW UP  Assessment:    Encounter for Medicare annual wellness exam  Essential hypertension - continue medications, DASH diet, exercise and monitor at home. Call if greater than 130/80.  -     CBC with Differential/Platelet -     CMP/GFR -     TSH  SVT (supraventricular tachycardia) (HCC) Had ED visits x 2, saw cardiology Controlled with atenolol  No symptoms at this time Continue cardio follow up as recommended  GERD Continue PPI/H2 blocker, diet discussed  Hyperlipidemia -continue medications, check lipids, decrease fatty foods, increase activity.  -     Lipid panel  Other abnormal glucose Recent A1Cs at goal Discussed diet/exercise, weight management  Defer A1C; check CMP  Vitamin D deficiency At goal at recent check; continue to recommend supplementation for goal of 70-100 Defer vitamin D level  Overweight Lifestyle discussed; increase exercise  Insomnia/anxiety Trazodone 75 mg and melatonin 5 mg working well; Stress management techniques discussed, increase water, good sleep hygiene discussed, increase exercise, and increase veggies.   Family history of CAD Female family members with MI and CAD requiring stents Had normal myoview 09/2018 by Dr. Eden Emms Control blood pressure, cholesterol, glucose, increase exercise.    Over 30 minutes of exam, counseling, chart review, and critical decision making was performed  Future Appointments  Date Time Provider Department Center  09/21/2020 11:30 AM [facility] MM 2 [facility]  09/21/2020 12:00 PM [facility] DX DEXA 1 [facility]  12/12/2020  3:00 PM Lucky Cowboy, MD [facility] None  09/04/2021 11:00 AM Judd Gaudier, NP [facility] None     Plan:   During the course of the visit the patient was educated and counseled about appropriate screening and preventive services including:    Pneumococcal vaccine   Influenza vaccine  Td vaccine  Prevnar  13  Screening electrocardiogram  Screening mammography  Bone densitometry screening  Colorectal cancer screening  Diabetes screening  Glaucoma screening  Nutrition counseling   Advanced directives: given info/requested copies   Subjective:   Jean Drake is a 74 y.o. female who presents for Medicare Annual Wellness Visit and 3 month follow up on hypertension, prediabetes, hyperlipidemia, vitamin D def.   Husband passed away last year, has occasional down moments (never full days) but does regular devotions and gratefulness/awareness and managing very well. She is prescribed trazodone 75 mg for mood/insomnia, also uses 5 mg melatonin, tapered off of xanax.   Hx of migraines, improved after retiring, now with headaches that respond well to tylenol.   BMI is Body mass index is 26.79 kg/m., she has been working on diet and exercise, does walk daily 15-20 min, also very active in yard, mowing several acres.  Wt Readings from Last 3 Encounters:  09/01/20 141 lb 12.8 oz (64.3 kg)  05/23/20 141 lb 6.4 oz (64.1 kg)  04/04/20 141 lb 12.8 oz (64.3 kg)   She has hx of episodes of SVT for which she presented to ED x2; improved with addition of atenolol.  She has family hx of CAD, underwent myoview 09/2018 by Dr. Eden Emms which was normal.   Her blood pressure has been controlled at home, today their BP is BP: 126/66 She does workout. She denies chest pain, shortness of breath, dizziness.   She is on cholesterol medication (rosuvastatin 10 mg daily). Her cholesterol is not at goal. The cholesterol last visit was:   Lab Results  Component Value Date   CHOL 136 05/23/2020   HDL  72 05/23/2020   LDLCALC 48 05/23/2020   TRIG 80 05/23/2020   CHOLHDL 1.9 05/23/2020    Last A1C in the office was:  Lab Results  Component Value Date   HGBA1C 5.3 05/23/2020   Last GFR Lab Results  Component Value Date   GFRNONAA 65 05/23/2020   Patient is on Vitamin D supplement. Lab Results   Component Value Date   VD25OH 70 05/23/2020      Medication Review Current Outpatient Medications on File Prior to Visit  Medication Sig Dispense Refill  . Ascorbic Acid (VITAMIN C PO) Take 1,000 mg by mouth daily.     Marland Kitchen aspirin 81 MG tablet Take 81 mg by mouth daily.    Marland Kitchen atenolol (TENORMIN) 50 MG tablet TAKE ONE TABLET BY MOUTH DAILY 90 tablet 3  . Cholecalciferol (VITAMIN D) 2000 UNITS tablet Take 6,000 Units by mouth daily.     . fexofenadine (ALLEGRA) 180 MG tablet Take 180 mg by mouth daily.    Marland Kitchen MAGNESIUM OXIDE PO Take 250 mg by mouth daily.    . meclizine (ANTIVERT) 25 MG tablet Take 1/2 to 1 tablet 2 to 3 x /day as needed for Dizziness / Vertigo 90 tablet 11  . Melatonin 5 MG TABS Take by mouth. Patient takes 6 tablets to equal 30 mg at bedtime.    . Multiple Vitamin (MULTIVITAMIN) tablet Take 1 tablet by mouth daily.    . multivitamin-lutein (OCUVITE-LUTEIN) CAPS capsule Take 1 capsule by mouth daily.    . Omega-3 Fatty Acids (FISH OIL) 1000 MG CAPS Take 1 capsule by mouth daily.    Marland Kitchen OVER THE COUNTER MEDICATION Takes Vitamin B Complex 1 daily    . rosuvastatin (CRESTOR) 10 MG tablet Take 1 tablet Daily for Cholesterol 90 tablet 3  . traZODone (DESYREL) 50 MG tablet take 1/2-2 tablets at bedtime as needed for sleep 90 tablet 1  . TURMERIC PO Take 2,000 mg by mouth.    . Zinc 50 MG TABS Take 100 mg by mouth daily.    Marland Kitchen dexamethasone (DECADRON) 4 MG tablet Take 1 tab 3 x day - 3 days, then 2 x day - 3 days, then 1 tab daily 20 tablet 1   No current facility-administered medications on file prior to visit.    Current Problems (verified) Patient Active Problem List   Diagnosis Date Noted  . CKD (chronic kidney disease) stage 3, GFR 30-59 ml/min (HCC) 09/21/2018  . Osteopenia 09/05/2018  . FHx: heart disease 06/18/2018  . Insomnia 12/06/2017  . SVT (supraventricular tachycardia) (HCC) 11/15/2015  . Overweight (BMI 25.0-29.9) 11/18/2014  . Medication management 04/20/2014   . Essential hypertension 10/20/2013  . Hyperlipidemia, mixed 10/20/2013  . Abnormal glucose 10/20/2013  . Vitamin D deficiency 10/20/2013  . GERD 10/20/2013    Screening Tests Immunization History  Administered Date(s) Administered  . DTaP 09/12/2013  . Influenza Split 09/12/2013  . Influenza, High Dose Seasonal PF 10/04/2014, 08/30/2017  . Pneumococcal Conjugate-13 10/04/2014  . Pneumococcal Polysaccharide-23 03/29/2009  . Td 12/20/2003  . Tdap 09/12/2013  . Zoster 06/07/2010    Preventative care: Last colonoscopy: 2002, 2008 norm Cologuard: 08/2019 Last mammogram: 09/2019, has scheduled 09/2020 PAP/pelvic: 2019 at GYN, Dr. Ambrose Mantle following DEXA: 08/2018 spine T -1.5, has scheduled  Echo 12/2015 Myocardial perfusion: 09/2018  Prior vaccinations: TD or Tdap: 2014  Influenza: 2018 - declines  Pneumococcal: 2010 Prevnar13: 2015 Shingles/Zostavax: 2011 Covid 19: declines   Names of Other Physician/Practitioners you currently use: 1. [facility]- here for primary care 2. Dr. Dione BoozeGroat , eye doctor, last visit 2021 3. Dr. Irving CopasFinn, dentist, last visit 2021 goes [id number]  Patient Care Team: Lucky CowboyMcKeown, William, MD as PCP - General (Internal Medicine) Wendall StadeNishan, Peter C, MD as PCP - Cardiology (Cardiology) Louis MeckelKaplan, Robert D, MD (Inactive) as Consulting Physician (Gastroenterology) Tracey HarriesHenley, Thomas, MD as Consulting Physician (Obstetrics and Gynecology)  Allergies Allergies  Allergen Reactions  . Penicillins Anaphylaxis  . Codeine Nausea And Vomiting  . Sudafed [Pseudoephedrine Hcl]     Makes heart race, per pt     SURGICAL HISTORY She  has a past surgical history that includes No past surgeries. FAMILY HISTORY Her family history includes Anuerysm in her sister; Breast cancer in her mother; CAD in her mother; Heart attack (age of onset: 3866) in her sister; Heart disease in her mother; Kidney failure in her paternal uncle; Lung cancer in her  father; Polycystic kidney disease in her paternal uncle; Stroke in her maternal grandmother. SOCIAL HISTORY She  reports that she has never smoked. She has never used smokeless tobacco. She reports that she does not drink alcohol.  MEDICARE WELLNESS OBJECTIVES: Physical activity:   Cardiac risk factors:   Depression/mood screen:   [facility]/22/2020  Decreased Interest 0  Down, Depressed, Hopeless 0  PHQ - 2 Score 0    ADLs:  In your present state of health, do you have any difficulty performing the following activities: 05/22/2020  Hearing? N  Vision? N  Difficulty concentrating or making decisions? N  Walking or climbing stairs? N  Dressing or bathing? N  Doing errands, shopping? N  Some recent data might be hidden    Cognitive Testing  Alert? Yes  Normal Appearance?Yes  Oriented to person? Yes  Place? Yes   Time? Yes  Recall of three objects?  Yes  Can perform simple calculations? Yes  Displays appropriate judgment?Yes  Can read the correct time from a watch face?Yes  EOL planning: Does Patient Have a Medical Advance Directive?: Yes Type of Advance Directive: Healthcare Power of Attorney, Living will Does patient want to make changes to medical advance directive?: No - Patient declined Copy of Healthcare Power of Attorney in Chart?: No - copy requested   Objective:   Today's Vitals   09/01/20 1122  BP: 126/66  Pulse: 67  Temp: (!) 96.6 F (35.9 C)  SpO2: 97%  Weight: 141 lb 12.8 oz (64.3 kg)   Body mass index is 26.79 kg/m.  General appearance: alert, no distress, WD/WN,  female HEENT: normocephalic, sclerae anicteric, TMs pearly, nares patent, no discharge or erythema, pharynx normal Oral cavity: MMM, no lesions Neck: supple, no lymphadenopathy, no thyromegaly, no masses Heart: RRR, normal S1, S2, no murmurs Lungs: CTA bilaterally, no wheezes, rhonchi, or rales Abdomen: +bs, soft, non tender, + distended/full lower AB, no masses, no  hepatomegaly, no splenomegaly Musculoskeletal: nontender, no swelling, no obvious deformity Extremities: no edema, no cyanosis, no clubbing Pulses: 2+ symmetric, upper and lower extremities, normal cap refill Neurological: alert, oriented x 3, CN2-12 intact, strength normal upper extremities and lower extremities, sensation normal throughout, DTRs 2+ throughout, no cerebellar signs, gait normal Psychiatric: normal affect, behavior normal, pleasant  Breast: defer Gyn: defer Rectal: defer   Medicare Attestation I have personally reviewed: The patient's medical and social history Their use of alcohol, tobacco or illicit drugs Their current medications and supplements The patient's functional ability including ADLs,fall risks, home safety risks, cognitive, and hearing and  visual impairment Diet and physical activities Evidence for depression or mood disorders  The patient's weight, height, BMI, and visual acuity have been recorded in the chart.  I have made referrals, counseling, and provided education to the patient based on review of the above and I have provided the patient with a written personalized care plan for preventive services.     Dan Maker, NP   09/01/2020

## 2020-09-01 ENCOUNTER — Other Ambulatory Visit: Payer: Self-pay

## 2020-09-01 ENCOUNTER — Encounter: Payer: Self-pay | Admitting: Adult Health

## 2020-09-01 ENCOUNTER — Ambulatory Visit (INDEPENDENT_AMBULATORY_CARE_PROVIDER_SITE_OTHER): Payer: Medicare Other | Admitting: Adult Health

## 2020-09-01 VITALS — BP 126/66 | HR 67 | Temp 96.6°F | Wt 141.8 lb

## 2020-09-01 DIAGNOSIS — E663 Overweight: Secondary | ICD-10-CM

## 2020-09-01 DIAGNOSIS — N183 Chronic kidney disease, stage 3 unspecified: Secondary | ICD-10-CM | POA: Diagnosis not present

## 2020-09-01 DIAGNOSIS — E559 Vitamin D deficiency, unspecified: Secondary | ICD-10-CM

## 2020-09-01 DIAGNOSIS — G43909 Migraine, unspecified, not intractable, without status migrainosus: Secondary | ICD-10-CM

## 2020-09-01 DIAGNOSIS — R6889 Other general symptoms and signs: Secondary | ICD-10-CM | POA: Diagnosis not present

## 2020-09-01 DIAGNOSIS — K2100 Gastro-esophageal reflux disease with esophagitis, without bleeding: Secondary | ICD-10-CM

## 2020-09-01 DIAGNOSIS — Z0001 Encounter for general adult medical examination with abnormal findings: Secondary | ICD-10-CM

## 2020-09-01 DIAGNOSIS — Z79899 Other long term (current) drug therapy: Secondary | ICD-10-CM | POA: Diagnosis not present

## 2020-09-01 DIAGNOSIS — M8588 Other specified disorders of bone density and structure, other site: Secondary | ICD-10-CM

## 2020-09-01 DIAGNOSIS — I10 Essential (primary) hypertension: Secondary | ICD-10-CM

## 2020-09-01 DIAGNOSIS — G518 Other disorders of facial nerve: Secondary | ICD-10-CM

## 2020-09-01 DIAGNOSIS — R7309 Other abnormal glucose: Secondary | ICD-10-CM

## 2020-09-01 DIAGNOSIS — I471 Supraventricular tachycardia: Secondary | ICD-10-CM

## 2020-09-01 DIAGNOSIS — Z0000 Encounter for general adult medical examination without abnormal findings: Secondary | ICD-10-CM

## 2020-09-01 DIAGNOSIS — F5101 Primary insomnia: Secondary | ICD-10-CM

## 2020-09-01 DIAGNOSIS — E782 Mixed hyperlipidemia: Secondary | ICD-10-CM

## 2020-09-01 NOTE — Patient Instructions (Addendum)
Jean Drake , Thank you for taking time to come for your Medicare Wellness Visit. I appreciate your ongoing commitment to your health goals. Please review the following plan we discussed and let me know if I can assist you in the future.   These are the goals we discussed: Goals    . Exercise 150 min/wk Moderate Activity       This is a list of the screening recommended for you and due dates:  Health Maintenance  Topic Date Due  . COVID-19 Vaccine (1) 09/17/2020*  . Flu Shot  02/09/2021*  . Mammogram  09/20/2021  . Cologuard (Stool DNA test)  08/16/2022  . Tetanus Vaccine  09/13/2023  . DEXA scan (bone density measurement)  Completed  .  Hepatitis C: One time screening is recommended by Center for Disease Control  (CDC) for  adults born from [birth year] through 1965.   Completed  . Pneumonia vaccines  Discontinued  *Topic was postponed. The date shown is not the original due date.       Exercising to Stay Healthy To become healthy and stay healthy, it is recommended that you do moderate-intensity and vigorous-intensity exercise. You can tell that you are exercising at a moderate intensity if your heart starts beating faster and you start breathing faster but can still hold a conversation. You can tell that you are exercising at a vigorous intensity if you are breathing much harder and faster and cannot hold a conversation while exercising. Exercising regularly is important. It has many health benefits, such as:  Improving overall fitness, flexibility, and endurance.  Increasing bone density.  Helping with weight control.  Decreasing body fat.  Increasing muscle strength.  Reducing stress and tension.  Improving overall health. How often should I exercise? Choose an activity that you enjoy, and set realistic goals. Your health care provider can help you make an activity plan that works for you. Exercise regularly as told by your health care provider. This may include:  Doing  strength training two times a week, such as: ? Lifting weights. ? Using resistance bands. ? Push-ups. ? Sit-ups. ? Yoga.  Doing a certain intensity of exercise for a given amount of time. Choose from these options: ? A total of 150 minutes of moderate-intensity exercise every week. ? A total of 75 minutes of vigorous-intensity exercise every week. ? A mix of moderate-intensity and vigorous-intensity exercise every week. Children, pregnant women, people who have not exercised regularly, people who are overweight, and older adults may need to talk with a health care provider about what activities are safe to do. If you have a medical condition, be sure to talk with your health care provider before you start a new exercise program. What are some exercise ideas? Moderate-intensity exercise ideas include:  Walking 1 mile (1.6 km) in about 15 minutes.  Biking.  Hiking.  Golfing.  Dancing.  Water aerobics. Vigorous-intensity exercise ideas include:  Walking 4.5 miles (7.2 km) or more in about 1 hour.  Jogging or running 5 miles (8 km) in about 1 hour.  Biking 10 miles (16.1 km) or more in about 1 hour.  Lap swimming.  Roller-skating or in-line skating.  Cross-country skiing.  Vigorous competitive sports, such as football, basketball, and soccer.  Jumping rope.  Aerobic dancing. What are some everyday activities that can help me to get exercise?  Yard work, such as: ? Pushing a Surveyor, mining. ? Raking and bagging leaves.  Washing your car.  Pushing a stroller.  Shoveling snow.  Gardening.  Washing windows or floors. How can I be more active in my day-to-day activities?  Use stairs instead of an elevator.  Take a walk during your lunch break.  If you drive, park your car farther away from your work or school.  If you take public transportation, get off one stop early and walk the rest of the way.  Stand up or walk around during all of your indoor phone  calls.  Get up, stretch, and walk around every 30 minutes throughout the day.  Enjoy exercise with a friend. Support to continue exercising will help you keep a regular routine of activity. What guidelines can I follow while exercising?  Before you start a new exercise program, talk with your health care provider.  Do not exercise so much that you hurt yourself, feel dizzy, or get very short of breath.  Wear comfortable clothes and wear shoes with good support.  Drink plenty of water while you exercise to prevent dehydration or heat stroke.  Work out until your breathing and your heartbeat get faster. Where to find more information  [place] Department of Health and Human Services: ThisPath.fi  Centers for Disease Control and Prevention (CDC): FootballExhibition.com.br Summary  Exercising regularly is important. It will improve your overall fitness, flexibility, and endurance.  Regular exercise also will improve your overall health. It can help you control your weight, reduce stress, and improve your bone density.  Do not exercise so much that you hurt yourself, feel dizzy, or get very short of breath.  Before you start a new exercise program, talk with your health care provider. This information is not intended to replace advice given to you by your health care provider. Make sure you discuss any questions you have with your health care provider. Document Revised: 10/11/2017 Document Reviewed: 09/19/2017 Elsevier Patient Education  2020 [employer].

## 2020-09-02 LAB — LIPID PANEL
Cholesterol: 144 mg/dL (ref <200)
HDL: 77 mg/dL (ref >=50)
LDL Cholesterol (Calc): 51 mg/dL (calc)
Non-HDL Cholesterol (Calc): 67 mg/dL (calc) (ref <130)
Total CHOL/HDL Ratio: 1.9 (calc) (ref <5.0)
Triglycerides: 75 mg/dL (ref <150)

## 2020-09-02 LAB — CBC WITH DIFFERENTIAL/PLATELET
Absolute Monocytes: 552 cells/uL (ref 200–950)
Basophils Absolute: 50 cells/uL (ref 0–200)
Basophils Relative: 0.8 %
Eosinophils Absolute: 161 cells/uL (ref 15–500)
Eosinophils Relative: 2.6 %
HCT: 43.5 % (ref 35.0–45.0)
Hemoglobin: 14.5 g/dL (ref 11.7–15.5)
Lymphs Abs: 725 cells/uL — ABNORMAL LOW (ref 850–3900)
MCH: 30.2 pg (ref 27.0–33.0)
MCHC: 33.3 g/dL (ref 32.0–36.0)
MCV: 90.6 fL (ref 80.0–100.0)
MPV: 9 fL (ref 7.5–12.5)
Monocytes Relative: 8.9 %
Neutro Abs: 4712 cells/uL (ref 1500–7800)
Neutrophils Relative %: 76 %
Platelets: 329 10*3/uL (ref 140–400)
RBC: 4.8 10*6/uL (ref 3.80–5.10)
RDW: 12 % (ref 11.0–15.0)
Total Lymphocyte: 11.7 %
WBC: 6.2 10*3/uL (ref 3.8–10.8)

## 2020-09-02 LAB — COMPLETE METABOLIC PANEL WITH GFR
AG Ratio: 1.9 (calc) (ref 1.0–2.5)
ALT: 25 U/L (ref 6–29)
AST: 32 U/L (ref 10–35)
Albumin: 4.5 g/dL (ref 3.6–5.1)
Alkaline phosphatase (APISO): 77 U/L (ref 37–153)
BUN: 13 mg/dL (ref 7–25)
CO2: 30 mmol/L (ref 20–32)
Calcium: 10 mg/dL (ref 8.6–10.4)
Chloride: 102 mmol/L (ref 98–110)
Creat: 0.8 mg/dL (ref 0.60–0.93)
GFR, Est African American: 84 mL/min/{1.73_m2} (ref >=60)
GFR, Est Non African American: 73 mL/min/{1.73_m2} (ref >=60)
Globulin: 2.4 g/dL (calc) (ref 1.9–3.7)
Glucose, Bld: 102 mg/dL — ABNORMAL HIGH (ref 65–99)
Potassium: 4.5 mmol/L (ref 3.5–5.3)
Sodium: 139 mmol/L (ref 135–146)
Total Bilirubin: 0.4 mg/dL (ref 0.2–1.2)
Total Protein: 6.9 g/dL (ref 6.1–8.1)

## 2020-09-02 LAB — TSH: TSH: 0.98 mIU/L (ref 0.40–4.50)

## 2020-09-02 LAB — MAGNESIUM: Magnesium: 2.2 mg/dL (ref 1.5–2.5)

## 2020-09-21 ENCOUNTER — Other Ambulatory Visit: Payer: Self-pay

## 2020-09-21 ENCOUNTER — Ambulatory Visit
Admission: RE | Admit: 2020-09-21 | Discharge: 2020-09-21 | Disposition: A | Discharge status: Home / Self Care | Payer: Medicare Other | Source: Ambulatory Visit | Attending: Adult Health Nurse Practitioner | Admitting: Adult Health Nurse Practitioner

## 2020-09-21 DIAGNOSIS — M8588 Other specified disorders of bone density and structure, other site: Secondary | ICD-10-CM

## 2020-09-21 DIAGNOSIS — Z1231 Encounter for screening mammogram for malignant neoplasm of breast: Secondary | ICD-10-CM

## 2020-09-21 DIAGNOSIS — Z780 Asymptomatic menopausal state: Secondary | ICD-10-CM | POA: Diagnosis not present

## 2020-09-27 DIAGNOSIS — Z0120 Encounter for dental examination and cleaning without abnormal findings: Secondary | ICD-10-CM | POA: Diagnosis not present

## 2020-10-01 ENCOUNTER — Other Ambulatory Visit: Payer: Self-pay | Admitting: Adult Health

## 2020-11-10 ENCOUNTER — Other Ambulatory Visit: Payer: Self-pay | Admitting: Adult Health

## 2020-11-24 ENCOUNTER — Encounter: Payer: Medicare Other | Admitting: Internal Medicine

## 2020-12-01 ENCOUNTER — Encounter: Payer: Medicare Other | Admitting: Internal Medicine

## 2020-12-11 ENCOUNTER — Encounter: Payer: Self-pay | Admitting: Internal Medicine

## 2020-12-11 DIAGNOSIS — N182 Chronic kidney disease, stage 2 (mild): Secondary | ICD-10-CM | POA: Insufficient documentation

## 2020-12-11 NOTE — Patient Instructions (Signed)

## 2020-12-11 NOTE — Progress Notes (Signed)
Annual Screening/Preventative Visit & Comprehensive Evaluation &  Examination      This very nice 75 y.o. [name]  presents for a Screening /Preventative Visit & comprehensive evaluation and management of multiple medical co-morbidities.  Patient has been followed for HTN, HLD, Prediabetes  and Vitamin D Deficiency.       HTN predates circa 2012. Patient's BP has been controlled at home.  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  In 2019, she had a negative Myoview w/EF74%. 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  Patient denies any cardiac symptoms as chest pain, palpitations, shortness of breath, dizziness or ankle swelling. Today's BP was initially slightly elevated & rechecked at goal - 134/68.      Patient's hyperlipidemia is controlled with diet/Rosuvastatin. Patient denies myalgias or other medication SE's. Last lipids were at goal:  Lab Results  Component Value Date   CHOL 144 09/01/2020   HDL 77 09/01/2020   LDLCALC 51 09/01/2020   TRIG 75 09/01/2020   CHOLHDL 1.9 09/01/2020       Patient is overweight (BMI 26.79) & is monitored for glucose intolerance.   Patient denies reactive hypoglycemic symptoms, visual blurring, diabetic polys or paresthesias.  Last A1c was  Normal & at goal:  Lab Results  Component Value Date   HGBA1C 5.3 05/23/2020       Finally, patient has history of Vitamin D Deficiency ("9"/2009) and last Vitamin D was at goal:  Lab Results  Component Value Date   VD25OH 28 05/23/2020    Current Outpatient Medications on File Prior to Visit  Medication Sig  . VITAMIN C 1,000 mg  Take  daily.   Marland Kitchen aspirin 81 MG tablet Take  daily.  Marland Kitchen atenolol 50 MG tablet Take      1 tablet       Daily       for BP  . VITAMIN D 2000 u Take 6,000 Units daily.   . fexofenadine (ALLEGRA) 180 MG tablet Take 1 daily.  Marland Kitchen MAGNESIUM OXIDE 400 mg Take   daily.  . meclizine = 25 MG tablet Take 1/2 to 1 tablet 2 to 3 x /day as needed   .  Melatonin 5 MG TABS Takes 3 tablets = 15 mg at bedtime.  . Multiple Vitamin  Take 1 tablet  daily.  . OCUVITE-LUTEIN Take 1 capsule  daily.  . Omega-3 FISH OIL  1000 MG CAPS Take 1 capsule  daily.  . Vitamin B Complex  Takes 1 daily  . rosuvastatin  10 MG tablet Take 1 tablet Daily   . traZODone  50 MG tablet take 1/2-2 tablets at bedtime as needed for sleep  . TURMERIC  1,500 mg  Takes daily  . Zinc 50 MG TABS Take daily.    Allergies  Allergen Reactions  . Penicillins Anaphylaxis  . Codeine Nausea And Vomiting  . Sudafed [Pseudoephedrine Hcl] Makes heart race, per pt    Past Medical History:  Diagnosis Date  . Essential hypertension 10/20/2013  . 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 GERD 10/20/2013  . Hyperlipidemia 10/20/2013  . Migraine, unspecified, without mention of intractable migraine without mention of status migrainosus 10/20/2013  . Morbid obesity (HCC) 11/18/2014  . Prediabetes 10/20/2013  . SVT (supraventricular tachycardia) (HCC) 11/15/2015  . Vitamin D deficiency 10/20/2013   Health Maintenance  Topic Date Due  . COVID-19 Vaccine (1) Never done  . INFLUENZA VACCINE  02/09/2021 (Originally 06/12/2020)  .  Fecal DNA (Cologuard)  08/16/2022  . MAMMOGRAM  09/21/2022  . TETANUS/TDAP  09/13/2023  . DEXA SCAN  Completed  . Hepatitis C Screening  Completed  . PNA vac Low Risk Adult  Discontinued   Immunization History  Administered Date(s) Administered  . DTaP 09/12/2013  . Influenza Split 09/12/2013  . Influenza, High Dose Seasonal PF 10/04/2014, 08/30/2017  . Pneumococcal Conjugate-13 10/04/2014  . Pneumococcal Polysaccharide-23 03/29/2009  . Td 12/20/2003  . Tdap 09/12/2013  . Zoster 06/07/2010    Last Colon - 06/13/2007 - Dr Arlyce Dice   Cologard - 08/17/2019 - Negative - Recc 3 yr f/u due Oct 2023  Last MGM - 11/24/2019   Past Surgical History:  Procedure Laterality Date  . NO PAST SURGERIES     Family History  Problem Relation Age of Onset  . Heart disease Mother    . Breast cancer Mother   . CAD Mother        stents emergently in her 2s  . Lung cancer Father        smoker, mets to brain  . Stroke Maternal Grandmother   . Polycystic kidney disease Paternal Uncle   . Kidney failure Paternal Uncle   . Heart attack Sister 51       LAD  . Anuerysm Sister    Social History - Widowed 06/13/2019   Tobacco Use  . Smoking status: Never Smoker  . Smokeless tobacco: Never Used  Substance Use Topics  . Alcohol use: No    ROS Constitutional: Denies fever, chills, weight loss/gain, headaches, insomnia,  night sweats, and change in appetite. Does c/o fatigue. Eyes: Denies redness, blurred vision, diplopia, discharge, itchy, watery eyes.  ENT: Denies discharge, congestion, post nasal drip, epistaxis, sore throat, earache, hearing loss, dental pain, Tinnitus, Vertigo, Sinus pain, snoring.  Cardio: Denies chest pain, palpitations, irregular heartbeat, syncope, dyspnea, diaphoresis, orthopnea, PND, claudication, edema Respiratory: denies cough, dyspnea, DOE, pleurisy, hoarseness, laryngitis, wheezing.  Gastrointestinal: Denies dysphagia, heartburn, reflux, water brash, pain, cramps, nausea, vomiting, bloating, diarrhea, constipation, hematemesis, melena, hematochezia, jaundice, hemorrhoids Genitourinary: Denies dysuria, frequency, urgency, nocturia, hesitancy, discharge, hematuria, flank pain Breast: Breast lumps, nipple discharge, bleeding.  Musculoskeletal: Denies arthralgia, myalgia, stiffness, Jt. Swelling, pain, limp, and strain/sprain. Denies falls. Skin: Denies puritis, rash, hives, warts, acne, eczema, changing in skin lesion Neuro: No weakness, tremor, incoordination, spasms, paresthesia, pain Psychiatric: Denies confusion, memory loss, sensory loss. Denies Depression. Endocrine: Denies change in weight, skin, hair change, nocturia, and paresthesia, diabetic polys, visual blurring, hyper / hypo glycemic episodes.  Heme/Lymph: No excessive bleeding,  bruising, enlarged lymph nodes.  Physical Exam  BP 134/68   Pulse 68   Temp (!) 97 F (36.1 C)   Resp 16   Ht 5' 0.5" (1.537 m)   Wt 142 lb 3.2 oz (64.5 kg)   SpO2 95%   BMI 27.31 kg/m   General Appearance: Well nourished, well groomed and in no apparent distress.  Eyes: PERRLA, EOMs, conjunctiva no swelling or erythema, normal fundi and vessels. Sinuses: No frontal/maxillary tenderness ENT/Mouth: EACs patent / TMs  nl. Nares clear without erythema, swelling, mucoid exudates. Oral hygiene is good. No erythema, swelling, or exudate. Tongue normal, non-obstructing. Tonsils not swollen or erythematous. Hearing normal.  Neck: Supple, thyroid not palpable. No bruits, nodes or JVD. Respiratory: Respiratory effort normal.  BS equal and clear bilateral without rales, rhonci, wheezing or stridor. Cardio: Heart sounds are normal with regular rate and rhythm and no murmurs, rubs or gallops. Peripheral pulses are  normal and equal bilaterally without edema. No aortic or femoral bruits. Chest: symmetric with normal excursions and percussion. Breasts: Symmetric, without lumps, nipple discharge, retractions, or fibrocystic changes.  Abdomen: Flat, soft with bowel sounds active. Nontender, no guarding, rebound, hernias, masses, or organomegaly.  Lymphatics: Non tender without lymphadenopathy.  Genitourinary:  Musculoskeletal: Full ROM all peripheral extremities, joint stability, 5/5 strength, and normal gait. Skin: Warm and dry without rashes, lesions, cyanosis, clubbing or  ecchymosis.  Neuro: Cranial nerves intact, reflexes equal bilaterally. Normal muscle tone, no cerebellar symptoms. Sensation intact.  Pysch: Alert and oriented X 3, normal affect, Insight and Judgment appropriate.   Assessment and Plan  1. Annual Preventative Screening Examination   2. Essential hypertension  - EKG 12-Lead - Urinalysis, Routine w reflex microscopic - Microalbumin / creatinine urine ratio - CBC with  Differential/Platelet - COMPLETE METABOLIC PANEL WITH GFR - Magnesium - TSH  3. Hyperlipidemia, mixed  - EKG 12-Lead - Lipid panel - TSH  4. Abnormal glucose  - EKG 12-Lead - Hemoglobin A1c - Insulin, random  5. Vitamin D deficiency  - VITAMIN D 25 Hydroxy   6. CKD (chronic kidney disease) stage 2, GFR 60-89 ml/min  - COMPLETE METABOLIC PANEL WITH GFR  7. Screening for colorectal cancer  - POC Hemoccult Bld/Stl   8. Screening for ischemic heart disease  - EKG 12-Lead  9. FHx: heart disease  - EKG 12-Lead  10. Medication management  - Urinalysis, Routine w reflex microscopic - Microalbumin / creatinine urine ratio - CBC with Differential/Platelet - COMPLETE METABOLIC PANEL WITH GFR - Magnesium - Lipid panel - TSH - Hemoglobin A1c - Insulin, random - VITAMIN D 25 Hydroxy           Patient was counseled in prudent diet to achieve/maintain BMI less than 25 for weight control, BP monitoring, regular exercise and medications. Discussed med's effects and SE's. Screening labs and tests as requested with regular follow-up as recommended. Over 40 minutes of exam, counseling, chart review and high complex critical decision making was performed.   Marinus Maw, MD

## 2020-12-12 ENCOUNTER — Encounter: Payer: Self-pay | Admitting: Internal Medicine

## 2020-12-12 ENCOUNTER — Ambulatory Visit (INDEPENDENT_AMBULATORY_CARE_PROVIDER_SITE_OTHER): Payer: Medicare Other | Admitting: Internal Medicine

## 2020-12-12 ENCOUNTER — Other Ambulatory Visit: Payer: Self-pay

## 2020-12-12 VITALS — BP 134/68 | HR 68 | Temp 97.0°F | Resp 16 | Ht 60.5 in | Wt 142.2 lb

## 2020-12-12 DIAGNOSIS — Z8249 Family history of ischemic heart disease and other diseases of the circulatory system: Secondary | ICD-10-CM | POA: Diagnosis not present

## 2020-12-12 DIAGNOSIS — E782 Mixed hyperlipidemia: Secondary | ICD-10-CM

## 2020-12-12 DIAGNOSIS — N182 Chronic kidney disease, stage 2 (mild): Secondary | ICD-10-CM

## 2020-12-12 DIAGNOSIS — Z1212 Encounter for screening for malignant neoplasm of rectum: Secondary | ICD-10-CM

## 2020-12-12 DIAGNOSIS — E559 Vitamin D deficiency, unspecified: Secondary | ICD-10-CM

## 2020-12-12 DIAGNOSIS — Z79899 Other long term (current) drug therapy: Secondary | ICD-10-CM

## 2020-12-12 DIAGNOSIS — R7309 Other abnormal glucose: Secondary | ICD-10-CM | POA: Diagnosis not present

## 2020-12-12 DIAGNOSIS — I471 Supraventricular tachycardia: Secondary | ICD-10-CM

## 2020-12-12 DIAGNOSIS — Z136 Encounter for screening for cardiovascular disorders: Secondary | ICD-10-CM | POA: Diagnosis not present

## 2020-12-12 DIAGNOSIS — Z0000 Encounter for general adult medical examination without abnormal findings: Secondary | ICD-10-CM

## 2020-12-12 DIAGNOSIS — Z0001 Encounter for general adult medical examination with abnormal findings: Secondary | ICD-10-CM

## 2020-12-12 DIAGNOSIS — I10 Essential (primary) hypertension: Secondary | ICD-10-CM

## 2020-12-12 DIAGNOSIS — Z1211 Encounter for screening for malignant neoplasm of colon: Secondary | ICD-10-CM

## 2020-12-13 LAB — CBC WITH DIFFERENTIAL/PLATELET
Absolute Monocytes: 546 cells/uL (ref 200–950)
Basophils Absolute: 63 cells/uL (ref 0–200)
Basophils Relative: 0.9 %
Eosinophils Absolute: 119 cells/uL (ref 15–500)
Eosinophils Relative: 1.7 %
HCT: 42.8 % (ref 35.0–45.0)
Hemoglobin: 14.5 g/dL (ref 11.7–15.5)
Lymphs Abs: 917 cells/uL (ref 850–3900)
MCH: 29.8 pg (ref 27.0–33.0)
MCHC: 33.9 g/dL (ref 32.0–36.0)
MCV: 88.1 fL (ref 80.0–100.0)
MPV: 9.4 fL (ref 7.5–12.5)
Monocytes Relative: 7.8 %
Neutro Abs: 5355 cells/uL (ref 1500–7800)
Neutrophils Relative %: 76.5 %
Platelets: 297 10*3/uL (ref 140–400)
RBC: 4.86 10*6/uL (ref 3.80–5.10)
RDW: 11.8 % (ref 11.0–15.0)
Total Lymphocyte: 13.1 %
WBC: 7 10*3/uL (ref 3.8–10.8)

## 2020-12-13 LAB — URINALYSIS, ROUTINE W REFLEX MICROSCOPIC
Bilirubin Urine: NEGATIVE
Glucose, UA: NEGATIVE
Hgb urine dipstick: NEGATIVE
Ketones, ur: NEGATIVE
Leukocytes,Ua: NEGATIVE
Nitrite: NEGATIVE
Protein, ur: NEGATIVE
Specific Gravity, Urine: 1.003 (ref 1.001–1.03)
pH: 6.5 (ref 5.0–8.0)

## 2020-12-13 LAB — LIPID PANEL
Cholesterol: 154 mg/dL (ref <200)
HDL: 76 mg/dL (ref >=50)
LDL Cholesterol (Calc): 60 mg/dL (calc)
Non-HDL Cholesterol (Calc): 78 mg/dL (calc) (ref <130)
Total CHOL/HDL Ratio: 2 (calc) (ref <5.0)
Triglycerides: 98 mg/dL (ref <150)

## 2020-12-13 LAB — COMPLETE METABOLIC PANEL WITH GFR
AG Ratio: 1.8 (calc) (ref 1.0–2.5)
ALT: 20 U/L (ref 6–29)
AST: 27 U/L (ref 10–35)
Albumin: 4.4 g/dL (ref 3.6–5.1)
Alkaline phosphatase (APISO): 87 U/L (ref 37–153)
BUN: 14 mg/dL (ref 7–25)
CO2: 26 mmol/L (ref 20–32)
Calcium: 9.6 mg/dL (ref 8.6–10.4)
Chloride: 102 mmol/L (ref 98–110)
Creat: 0.85 mg/dL (ref 0.60–0.93)
GFR, Est African American: 78 mL/min/{1.73_m2} (ref >=60)
GFR, Est Non African American: 67 mL/min/{1.73_m2} (ref >=60)
Globulin: 2.4 g/dL (calc) (ref 1.9–3.7)
Glucose, Bld: 94 mg/dL (ref 65–99)
Potassium: 4.1 mmol/L (ref 3.5–5.3)
Sodium: 137 mmol/L (ref 135–146)
Total Bilirubin: 0.4 mg/dL (ref 0.2–1.2)
Total Protein: 6.8 g/dL (ref 6.1–8.1)

## 2020-12-13 LAB — MAGNESIUM: Magnesium: 1.9 mg/dL (ref 1.5–2.5)

## 2020-12-13 LAB — MICROALBUMIN / CREATININE URINE RATIO
Creatinine, Urine: 19 mg/dL — ABNORMAL LOW (ref 20–275)
Microalb, Ur: <0.2 mg/dL

## 2020-12-13 LAB — HEMOGLOBIN A1C
Hgb A1c MFr Bld: 5.5 % of total Hgb (ref <5.7)
Mean Plasma Glucose: 111 mg/dL
eAG (mmol/L): 6.2 mmol/L

## 2020-12-13 LAB — VITAMIN D 25 HYDROXY (VIT D DEFICIENCY, FRACTURES): Vit D, 25-Hydroxy: 76 ng/mL (ref 30–100)

## 2020-12-13 LAB — INSULIN, RANDOM: Insulin: 8.8 u[IU]/mL

## 2020-12-13 LAB — TSH: TSH: 0.96 mIU/L (ref 0.40–4.50)

## 2020-12-13 NOTE — Progress Notes (Signed)
========================================================== -   Test results slightly outside the reference range are not unusual. If there is anything important, I will review this with you,  otherwise it is considered normal test values.  If you have further questions,  please do not hesitate to contact me at the office or via My Chart.  ==========================================================  -  Chol = 154   and LDL Chol = 60  -  Both  Excellent   - Very low risk for Heart Attack  / Stroke ========================================================== ==========================================================  -  A1c -Normal - Great - No Diabetes ==========================================================  -  Vitamin D = 76 - Excellent  ==========================================================  -  All Else - CBC - Kidneys - Electrolytes - Liver - Magnesium & Thyroid    - all  Normal / OK ===========================================================   - Keep up the [place] Work  !

## 2020-12-27 ENCOUNTER — Other Ambulatory Visit: Payer: Self-pay

## 2020-12-27 DIAGNOSIS — Z1211 Encounter for screening for malignant neoplasm of colon: Secondary | ICD-10-CM

## 2020-12-27 DIAGNOSIS — Z1212 Encounter for screening for malignant neoplasm of rectum: Secondary | ICD-10-CM | POA: Diagnosis not present

## 2020-12-27 LAB — POC HEMOCCULT BLD/STL (HOME/3-CARD/SCREEN)
Card #2 Fecal Occult Blod, POC: NEGATIVE
Card #3 Fecal Occult Blood, POC: NEGATIVE
Fecal Occult Blood, POC: NEGATIVE

## 2021-01-07 ENCOUNTER — Other Ambulatory Visit: Payer: Self-pay | Admitting: Adult Health Nurse Practitioner

## 2021-01-07 ENCOUNTER — Other Ambulatory Visit: Payer: Self-pay | Admitting: Internal Medicine

## 2021-01-07 DIAGNOSIS — E782 Mixed hyperlipidemia: Secondary | ICD-10-CM

## 2021-01-31 ENCOUNTER — Other Ambulatory Visit: Payer: Self-pay | Admitting: Internal Medicine

## 2021-03-13 ENCOUNTER — Ambulatory Visit: Payer: Medicare Other | Admitting: Adult Health Nurse Practitioner

## 2021-03-17 NOTE — Progress Notes (Signed)
3 MONTH FOLLOW UP  Assessment:    Essential hypertension - continue medications, DASH diet, exercise and monitor at home. Call if greater than 130/80.  -     CBC with Differential/Platelet -     CMP/GFR -     TSH  SVT (supraventricular tachycardia) (HCC) Had ED visits x 2, saw cardiology Controlled with atenolol  No symptoms at this time Continue cardio follow up as recommended  GERD Continue PPI/H2 blocker PRN, diet discussed  Hyperlipidemia -continue medications, check lipids, decrease fatty foods, increase activity.  -     Lipid panel  Other abnormal glucose Recent A1Cs at goal Discussed diet/exercise, weight management  Defer A1C; check CMP  Vitamin D deficiency At goal at recent check; continue to recommend supplementation for goal of 70-100 Defer vitamin D level  Overweight Lifestyle discussed; increase exercise  Insomnia/anxiety Trazodone 75 mg and melatonin 5 mg working well; Stress management techniques discussed, increase water, good sleep hygiene discussed, increase exercise, and increase veggies.   Persistent cough Cough- ? multifactorial-  Rotate allergy pill, add nasal sprays, voice rest, suppress cough with OTC sugar free candy and RX med.   Get CXR If not improving add H2i/PPI at night x 2 weeks, raise HOB 2 inches Follow up 1 month.    Over 30 minutes of exam, counseling, chart review, and critical decision making was performed  Future Appointments  Date Time Provider Department Center  06/16/2021 10:30 AM Lucky Cowboy, MD [facility] None  09/04/2021 11:00 AM Judd Gaudier, NP [facility] None  12/20/2021  3:00 PM Lucky Cowboy, MD [facility] None     Subjective:   Jean Drake is a 75 y.o. female who presents for 3 month follow up on hypertension, prediabetes, hyperlipidemia, vitamin D def.   She reports intermittent cough over the last year, day and night, also allergies and notes increased post nasal drip. She takes allegra  year round, has taken for over 2 years, also using flonase intermittently, just restarted. Non-smoker but secondary exposure. Also hx of GERD, not currently on med, denies sx.   Husband passed away in 27-Feb-2019, has occasional down moments (never full days) but does regular devotions and gratefulness/awareness and managing very well. She is prescribed trazodone 75 mg for mood/insomnia, also uses 5 mg melatonin, tapered off of xanax.   Hx of migraines, improved after retiring, now with headaches that respond well to tylenol.   BMI is Body mass index is 27.28 kg/m., she has been working on diet and exercise, does walk daily 15-20 min, also very active in yard, mowing several acres.  Wt Readings from Last 3 Encounters:  03/20/21 142 lb (64.4 kg)  12/12/20 142 lb 3.2 oz (64.5 kg)  09/01/20 141 lb 12.8 oz (64.3 kg)   She has hx of episodes of SVT for which she presented to ED x2; improved with addition of atenolol. She has family hx of CAD, underwent myoview 09/2018 by Dr. Eden Emms which was normal.   Her blood pressure has been controlled at home, today their BP is BP: 138/72 She does workout. She denies chest pain, shortness of breath, dizziness.   She is on cholesterol medication (rosuvastatin 10 mg daily). Her cholesterol is not at goal. The cholesterol last visit was:   Lab Results  Component Value Date   CHOL 154 12/12/2020   HDL 76 12/12/2020   LDLCALC 60 12/12/2020   TRIG 98 12/12/2020   CHOLHDL 2.0 12/12/2020    Last A1C in the office was:  Lab Results  Component Value Date   HGBA1C 5.5 12/12/2020   Last GFR Lab Results  Component Value Date   GFRNONAA 67 12/12/2020   Patient is on Vitamin D supplement. Lab Results  Component Value Date   VD25OH 76 12/12/2020      Medication Review Current Outpatient Medications on File Prior to Visit  Medication Sig Dispense Refill  . Ascorbic Acid (VITAMIN C PO) Take 1,000 mg by mouth daily.     Marland Kitchen aspirin 81 MG tablet Take 81 mg by mouth  daily.    Marland Kitchen atenolol (TENORMIN) 50 MG tablet TAKE ONE TABLET BY MOUTH EVERY DAY FOR BLOOD PRESSURE 90 tablet 3  . Cholecalciferol (VITAMIN D) 2000 UNITS tablet Take 6,000 Units by mouth daily.     . fexofenadine (ALLEGRA) 180 MG tablet Take 180 mg by mouth daily.    Marland Kitchen MAGNESIUM OXIDE PO Take 400 mg by mouth daily.    . meclizine (ANTIVERT) 25 MG tablet Take 1/2 to 1 tablet 2 to 3 x /day as needed for Dizziness / Vertigo 90 tablet 11  . Melatonin 5 MG TABS Take by mouth. Patient takes 3 tablets to equal 15 mg at bedtime.    . Multiple Vitamin (MULTIVITAMIN) tablet Take 1 tablet by mouth daily.    . multivitamin-lutein (OCUVITE-LUTEIN) CAPS capsule Take 1 capsule by mouth daily.    . Omega-3 Fatty Acids (FISH OIL) 1000 MG CAPS Take 1 capsule by mouth daily.    Marland Kitchen OVER THE COUNTER MEDICATION Takes Vitamin B Complex 1 daily    . rosuvastatin (CRESTOR) 10 MG tablet TAKE ONE TABLET BY MOUTH DAILY FOR cholesterol 90 tablet 3  . traZODone (DESYREL) 50 MG tablet take 1/2-2 tablets at bedtime as needed for sleep 60 tablet 1  . TURMERIC PO Take 1,500 mg by mouth.    . Zinc 50 MG TABS Take 100 mg by mouth daily.     No current facility-administered medications on file prior to visit.    Current Problems (verified) Patient Active Problem List   Diagnosis Date Noted  . CKD (chronic kidney disease) stage 2, GFR 60-89 ml/min 12/11/2020  . Osteopenia 09/05/2018  . FHx: heart disease 06/18/2018  . Insomnia 12/06/2017  . SVT (supraventricular tachycardia) (HCC) 11/15/2015  . Overweight (BMI 25.0-29.9) 11/18/2014  . Medication management 04/20/2014  . Essential hypertension 10/20/2013  . Hyperlipidemia, mixed 10/20/2013  . Abnormal glucose 10/20/2013  . Vitamin D deficiency 10/20/2013  . GERD 10/20/2013   Allergies Allergies  Allergen Reactions  . Penicillins Anaphylaxis  . Codeine Nausea And Vomiting  . Sudafed [Pseudoephedrine Hcl]     Makes heart race, per pt     SURGICAL HISTORY She  has  a past surgical history that includes No past surgeries. FAMILY HISTORY Her family history includes Anuerysm in her sister; Breast cancer in her mother; CAD in her mother; Heart attack (age of onset: 35) in her sister; Heart disease in her mother; Kidney failure in her paternal uncle; Lung cancer in her father; Polycystic kidney disease in her paternal uncle; Stroke in her maternal grandmother. SOCIAL HISTORY She  reports that she has never smoked. She has never used smokeless tobacco. She reports that she does not drink alcohol.  Review of Systems  Constitutional: Negative for malaise/fatigue and weight loss.  HENT: Negative for congestion, hearing loss, sore throat and tinnitus.   Eyes: Negative for blurred vision and double vision.  Respiratory: Positive for cough (dry intermittent for over 1 year). Negative  for sputum production, shortness of breath and wheezing.   Cardiovascular: Negative for chest pain, palpitations, orthopnea, claudication and leg swelling.  Gastrointestinal: Negative for abdominal pain, blood in stool, constipation, diarrhea, heartburn, melena, nausea and vomiting.  Genitourinary: Negative.   Musculoskeletal: Negative for joint pain and myalgias.  Skin: Negative for rash.  Neurological: Negative for dizziness, tingling, sensory change, weakness and headaches.  Endo/Heme/Allergies: Positive for environmental allergies. Negative for polydipsia.  Psychiatric/Behavioral: Negative.   All other systems reviewed and are negative.    Objective:   Today's Vitals   03/20/21 1116  BP: 138/72  Pulse: 72  Temp: (!) 97 F (36.1 C)  SpO2: 98%  Weight: 142 lb (64.4 kg)   Body mass index is 27.28 kg/m.  General appearance: alert, no distress, WD/WN,  female HEENT: normocephalic, sclerae anicteric, TMs pearly, nares patent, no discharge or erythema, pharynx normal Oral cavity: MMM, no lesions Neck: supple, no lymphadenopathy, no thyromegaly, no masses Heart: RRR,  normal S1, S2, no murmurs Lungs: CTA bilaterally, no wheezes, rhonchi, or rales Abdomen: +bs, soft, non tender, + distended/full lower AB, no masses, no hepatomegaly, no splenomegaly Musculoskeletal: nontender, no swelling, no obvious deformity Extremities: no edema, no cyanosis, no clubbing Pulses: 2+ symmetric, upper and lower extremities, normal cap refill Neurological: alert, oriented x 3, CN2-12 intact, strength normal upper extremities and lower extremities, sensation normal throughout, DTRs 2+ throughout, no cerebellar signs, gait normal Psychiatric: normal affect, behavior normal, pleasant    Dan Maker, NP   03/20/2021

## 2021-03-20 ENCOUNTER — Other Ambulatory Visit: Payer: Self-pay

## 2021-03-20 ENCOUNTER — Ambulatory Visit
Admission: RE | Admit: 2021-03-20 | Discharge: 2021-03-20 | Disposition: A | Discharge status: Home / Self Care | Payer: Medicare Other | Source: Ambulatory Visit | Attending: Adult Health | Admitting: Adult Health

## 2021-03-20 ENCOUNTER — Encounter: Payer: Self-pay | Admitting: Adult Health

## 2021-03-20 ENCOUNTER — Ambulatory Visit (INDEPENDENT_AMBULATORY_CARE_PROVIDER_SITE_OTHER): Payer: Medicare Other | Admitting: Adult Health

## 2021-03-20 VITALS — BP 138/72 | HR 72 | Temp 97.0°F | Wt 142.0 lb

## 2021-03-20 DIAGNOSIS — R058 Other specified cough: Secondary | ICD-10-CM

## 2021-03-20 DIAGNOSIS — E782 Mixed hyperlipidemia: Secondary | ICD-10-CM

## 2021-03-20 DIAGNOSIS — R053 Chronic cough: Secondary | ICD-10-CM | POA: Insufficient documentation

## 2021-03-20 DIAGNOSIS — Z79899 Other long term (current) drug therapy: Secondary | ICD-10-CM | POA: Diagnosis not present

## 2021-03-20 DIAGNOSIS — I471 Supraventricular tachycardia: Secondary | ICD-10-CM | POA: Diagnosis not present

## 2021-03-20 DIAGNOSIS — I10 Essential (primary) hypertension: Secondary | ICD-10-CM

## 2021-03-20 DIAGNOSIS — R7309 Other abnormal glucose: Secondary | ICD-10-CM

## 2021-03-20 DIAGNOSIS — F5101 Primary insomnia: Secondary | ICD-10-CM

## 2021-03-20 DIAGNOSIS — E663 Overweight: Secondary | ICD-10-CM

## 2021-03-20 DIAGNOSIS — R059 Cough, unspecified: Secondary | ICD-10-CM | POA: Diagnosis not present

## 2021-03-20 DIAGNOSIS — E559 Vitamin D deficiency, unspecified: Secondary | ICD-10-CM

## 2021-03-20 MED ORDER — BENZONATATE 100 MG PO CAPS: 100.0000 mg | ORAL_CAPSULE | Freq: Three times a day (TID) | ORAL | 0 refills | Status: DC | PRN

## 2021-03-20 NOTE — Patient Instructions (Addendum)
Get chest xray at [address] [facility].     Generally a cough is either coming from above or from below- so we will treat thisOR it can be from irritation/viral cough  To treat the nasal drip:  Try switching allegra to zyrtec  Do flonase nasal spray daily at night   Could also try azelastine/astalin nasal spray -    Could also try chlorphenirmine every 6 hours- This medication can make you sleepy but helps with nasal drip- get from over the counter.   Can do a steroid nasal spary 1-2 sparys at night each nostril. Remember to spray each nostril twice towards the outer part of your eye.  Do not sniff but instead pinch your nose and tilt your head back to help the medicine get into your sinuses.  The best time to do this is at bedtime. Stop if you get blurred vision or nose bleeds.   To treat the reflux Can pick up nexium or pepcid - 1 tab 1 hour before bedtime, for 2 weeks  To stop irritation: Need to STOP the cough Do sugar free candy Do the tessalon drops VOICE REST is VERY important  If not better in 4 weeks will refer to ENT    Common causes of cough OR hoarseness OR sore throat:   Allergies, Viral Infections, Acid Reflux and Bacterial Infections.    Allergies and viral infections cause a cough OR sore throat by post nasal drip and are often worse at night, can also have sneezing, lower grade fevers, clear/yellow mucus. This is best treated with allergy medications or nasal sprays.  Please get on allegra for 1-2 weeks The strongest is allegra or fexafinadine  Cheapest at [employer], [employer], [employer]   Bacterial infections are more severe than allergies or viral infections with fever, teeth pain, fatigue. This can be treated with prednisone and the same over the counter medication and after 7 days can be treated with an antibiotic.   Silent reflux/GERD can cause a cough OR sore throat OR hoarseness WITHOUT heart burn because the esophagus that goes to the  stomach and trachea that goes to the lungs are very close and when you lay down the acid can irritate your throat and lungs. This can cause hoarseness, cough, and wheezing. Please stop any alcohol or anti-inflammatories like aleve/advil/ibuprofen and start an over the counter Prilosec or omeprazole 1-2 times daily before food for 2 weeks, then switch to over the counter zantac/ratinidine or pepcid/famotadine once at night for 2 weeks.    sometimes irritation causes more irritation. Try voice rest, use sugar free cough drops to prevent coughing, and try to stop clearing your throat.   If you ever have a cough that does not go away after trying these things please make a follow up visit for further evaluation or we can refer you to a specialist. Or if you ever have shortness of breath or chest pain go to the ER.    Silent reflux: Not all heartburn burns...Marland KitchenMarland KitchenMarland Kitchen  What is LPR? Laryngopharyngeal reflux (LPR) or silent reflux is a condition in which acid that is made in the stomach travels up the esophagus (swallowing tube) and gets to the throat. Not everyone with reflux has a lot of heartburn or indigestion. In fact, many people with LPR never have heartburn. This is why LPR is called SILENT REFLUX, and the terms "Silent reflux" and "LPR" are often used interchangeably. Because LPR is silent, it is sometimes difficult to  diagnose.  How can you tell if you have LPR?  Marland Kitchen Chronic hoarseness- Some people have hoarseness that comes and goes . throat clearing  . Cough . It can cause shortness of breath and cause asthma like symptoms. Marland Kitchen a feeling of a lump in the throat  . difficulty swallowing . a problem with too much nose and throat drainage.  . Some people will feel their esophagus spasm which feels like their heart beating hard and fast, this will usually be after a meal, at rest, or lying down at night.    How do I treat this? Treatment for LPR should be individualized, and your doctor will  suggest the best treatment for you. Generally there are several treatments for LPR: . changing habits and diet to reduce reflux,  . medications to reduce stomach acid, and  . surgery to prevent reflux. Most people with LPR need to modify how and when they eat, as well as take some medication, to get well. Sometimes, nonprescription liquid antacids, such as Maalox, Gelucil and Mylanta are recommended. When used, these antacids should be taken four times each day - one tablespoon one hour after each meal and before bedtime. Dietary and lifestyle changes alone are not often enough to control LPR - medications that reduce stomach acid are also usually needed. These must be prescribed by our doctor.   TIPS FOR REDUCING REFLUX AND LPR Control your LIFE-STYLE and your DIET! Marland Kitchen If you use tobacco, QUIT.  Marland Kitchen Smoking makes you reflux. After every cigarette you have some LPR.  . Don't wear clothing that is too tight, especially around the waist (trousers, corsets, belts).  . Do not lie down just after eating...in fact, do not eat within three hours of bedtime.  . You should be on a low-fat diet.  . Limit your intake of red meat.  . Limit your intake of butter.  Marland Kitchen Avoid fried foods.  . Avoid chocolate  . Avoid cheese.  Marland Kitchen Avoid eggs. Marland Kitchen Specifically avoid caffeine (especially coffee and tea), soda pop (especially cola) and mints.  . Avoid alcoholic beverages, particularly in the evening.

## 2021-03-21 LAB — COMPLETE METABOLIC PANEL WITH GFR
AG Ratio: 2.1 (calc) (ref 1.0–2.5)
ALT: 24 U/L (ref 6–29)
AST: 29 U/L (ref 10–35)
Albumin: 4.5 g/dL (ref 3.6–5.1)
Alkaline phosphatase (APISO): 86 U/L (ref 37–153)
BUN: 13 mg/dL (ref 7–25)
CO2: 29 mmol/L (ref 20–32)
Calcium: 9.7 mg/dL (ref 8.6–10.4)
Chloride: 104 mmol/L (ref 98–110)
Creat: 0.76 mg/dL (ref 0.60–0.93)
GFR, Est African American: 90 mL/min/{1.73_m2} (ref >=60)
GFR, Est Non African American: 77 mL/min/{1.73_m2} (ref >=60)
Globulin: 2.1 g/dL (calc) (ref 1.9–3.7)
Glucose, Bld: 109 mg/dL — ABNORMAL HIGH (ref 65–99)
Potassium: 4.9 mmol/L (ref 3.5–5.3)
Sodium: 139 mmol/L (ref 135–146)
Total Bilirubin: 0.4 mg/dL (ref 0.2–1.2)
Total Protein: 6.6 g/dL (ref 6.1–8.1)

## 2021-03-21 LAB — LIPID PANEL
Cholesterol: 138 mg/dL (ref <200)
HDL: 69 mg/dL (ref >=50)
LDL Cholesterol (Calc): 53 mg/dL (calc)
Non-HDL Cholesterol (Calc): 69 mg/dL (calc) (ref <130)
Total CHOL/HDL Ratio: 2 (calc) (ref <5.0)
Triglycerides: 76 mg/dL (ref <150)

## 2021-03-21 LAB — CBC WITH DIFFERENTIAL/PLATELET
Absolute Monocytes: 578 cells/uL (ref 200–950)
Basophils Absolute: 48 cells/uL (ref 0–200)
Basophils Relative: 0.7 %
Eosinophils Absolute: 102 cells/uL (ref 15–500)
Eosinophils Relative: 1.5 %
HCT: 42.7 % (ref 35.0–45.0)
Hemoglobin: 14.1 g/dL (ref 11.7–15.5)
Lymphs Abs: 802 cells/uL — ABNORMAL LOW (ref 850–3900)
MCH: 29.8 pg (ref 27.0–33.0)
MCHC: 33 g/dL (ref 32.0–36.0)
MCV: 90.3 fL (ref 80.0–100.0)
MPV: 9 fL (ref 7.5–12.5)
Monocytes Relative: 8.5 %
Neutro Abs: 5270 cells/uL (ref 1500–7800)
Neutrophils Relative %: 77.5 %
Platelets: 245 10*3/uL (ref 140–400)
RBC: 4.73 10*6/uL (ref 3.80–5.10)
RDW: 11.8 % (ref 11.0–15.0)
Total Lymphocyte: 11.8 %
WBC: 6.8 10*3/uL (ref 3.8–10.8)

## 2021-03-21 LAB — MAGNESIUM: Magnesium: 2.1 mg/dL (ref 1.5–2.5)

## 2021-03-21 LAB — TSH: TSH: 1.02 mIU/L (ref 0.40–4.50)

## 2021-04-04 ENCOUNTER — Telehealth: Payer: Self-pay

## 2021-04-04 ENCOUNTER — Other Ambulatory Visit: Payer: Self-pay | Admitting: Adult Health

## 2021-04-04 MED ORDER — PREDNISONE 20 MG PO TABS: ORAL_TABLET | ORAL | 0 refills | Status: DC

## 2021-04-04 MED ORDER — AZITHROMYCIN 250 MG PO TABS: ORAL_TABLET | ORAL | 1 refills | Status: AC

## 2021-04-04 NOTE — Telephone Encounter (Signed)
Still not feeling better. Head is full of congestion and it feels like it's moving into her chest. Going out of town Friday.

## 2021-04-05 NOTE — Telephone Encounter (Signed)
Patient notified

## 2021-04-19 ENCOUNTER — Other Ambulatory Visit: Payer: Self-pay | Admitting: Adult Health Nurse Practitioner

## 2021-05-23 ENCOUNTER — Other Ambulatory Visit: Payer: Self-pay | Admitting: Internal Medicine

## 2021-05-23 DIAGNOSIS — R42 Dizziness and giddiness: Secondary | ICD-10-CM

## 2021-06-15 ENCOUNTER — Encounter: Payer: Self-pay | Admitting: Internal Medicine

## 2021-06-15 NOTE — Patient Instructions (Signed)

## 2021-06-15 NOTE — Progress Notes (Signed)
Future Appointments  Date Time Provider Department Center  06/16/2021  - 6 mo OV 10:30 AM Lucky Cowboy, MD [facility] None  09/05/2021  - Wellness 11:00 AM Judd Gaudier, NP [facility] None  12/20/2021  - CPE  3:00 PM Lucky Cowboy, MD [facility] None    History of Present Illness:       This very nice 75 y.o. [name] presents for 6 month follow up with HTN, HLD, Pre-Diabetes and Vitamin D Deficiency.        Patient is treated for HTN (2012) & BP has been controlled at home.  Today's BP was initially slightly elevated 7 rechecked at goal - 134/72. 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  Negative Myoview 2019. Patient has had no complaints of any cardiac type chest pain, palpitations, dyspnea / orthopnea / PND, dizziness, claudication, or dependent edema.       Hyperlipidemia is controlled with diet & meds. Patient denies myalgias or other med SE's. Last Lipids were at goal:  Lab Results  Component Value Date   CHOL 138 03/20/2021   HDL 69 03/20/2021   LDLCALC 53 03/20/2021   TRIG 76 03/20/2021   CHOLHDL 2.0 03/20/2021     Also, the patient has has been monitored for abnormal glucose and has had no symptoms of reactive hypoglycemia, diabetic polys, paresthesias or visual blurring.  Last A1c was normal / at goal:  Lab Results  Component Value Date   HGBA1C 5.5 12/12/2020                                                          Further, the patient also has history of Vitamin D Deficiency ("9" /2009) and supplements vitamin D without any suspected side-effects. Last vitamin D was at goal:   Lab Results  Component Value Date   VD25OH 76 12/12/2020     Current Outpatient Medications on File Prior to Visit  Medication Sig   VITAMIN C  1,000 mg Take  daily.    aspirin 81 MG tablet Take daily.   atenolol (TENORMIN) 50 MG tablet TAKE ONE TABLET  EVERY DAY    benzonatate  100 MG capsule Take 1-2 capsules 3 times daily as needed for cough    VITAMIN D  2000 UNITS tablet Take 6,000 Units  daily.    fexofenadine 180 MG tablet Take daily.   MAGNESIUM OXIDE  400 mg  Take daily.   meclizine  25 MG tablet TAKE 1/2-1 TABLET 2-3 TIMES DAILY AS NEEDE   Melatonin 5 MG TABS Patient takes 3 tablets to equal 15 mg at bedtime.   Multiple Vitamin Take 1 tablet daily.   OCUVITE-LUTEIN Take 1 capsule daily.   Omega-3  FISH OIL 1000 MG CAPS Take 1 capsule daily.   Vitamin B Complex  Takes 1 daily   rosuvastatin (CRESTOR) 10 MG tablet TAKE ONE TABLET DAILY    traZODone  50 MG tablet take 1/2-2 tablets at bedtime as needed   TURMERIC Take    Zinc 50 MG TABS Take 100 mg  daily.     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   GERD 10/20/2013   Hyperlipidemia 10/20/2013   Migraine, unspecified, without mention of intractable migraine without mention of status migrainosus 10/20/2013   Morbid obesity (HCC) 11/18/2014   Prediabetes 10/20/2013   SVT (supraventricular tachycardia) (HCC) 11/15/2015   Vitamin D deficiency 10/20/2013     Immunization History  Administered Date(s) Administered   DTaP 09/12/2013   Influenza Split 09/12/2013   Influenza, High Dose  10/04/2014, 08/30/2017   Pneumococcal  -13 10/04/2014   Pneumococcal -23 03/29/2009   Td 12/20/2003   Tdap 09/12/2013   Zoster, Live 06/07/2010     Past Surgical History:  Procedure Laterality Date   NO PAST SURGERIES      FHx:    Reviewed / unchanged  SHx:    Reviewed / unchanged   Systems Review:  Constitutional: Denies fever, chills, wt changes, headaches, insomnia, fatigue, night sweats, change in appetite. Eyes: Denies redness, blurred vision, diplopia, discharge, itchy, watery eyes.  ENT: Denies discharge, congestion, post nasal drip, epistaxis, sore throat,  earache, hearing loss, dental pain, tinnitus, vertigo, sinus pain, snoring.  CV: Denies chest pain, palpitations, irregular heartbeat, syncope, dyspnea, diaphoresis, orthopnea, PND, claudication or edema. Respiratory: denies cough, dyspnea, DOE, pleurisy, hoarseness, laryngitis, wheezing.  Gastrointestinal: Denies dysphagia, odynophagia, heartburn, reflux, water brash, abdominal pain or cramps, nausea, vomiting, bloating, diarrhea, constipation, hematemesis, melena, hematochezia  or hemorrhoids. Genitourinary: Denies dysuria, frequency, urgency, nocturia, hesitancy, discharge, hematuria or flank pain. Musculoskeletal: Denies arthralgias, myalgias, stiffness, jt. swelling, pain, limping or strain/sprain.  Skin: Denies pruritus, rash, hives, warts, acne, eczema or change in skin lesion(s). Neuro: No weakness, tremor, incoordination, spasms, paresthesia or pain. Psychiatric: Denies confusion, memory loss or sensory loss. Endo: Denies change in weight, skin or hair change.  Heme/Lymph: No excessive bleeding, bruising or enlarged lymph nodes.  Physical Exam  BP 134/72   Pulse 75   Temp (!) 97.5 F (36.4 C)   Resp 16   Ht 5' 0.05" (1.525 m)   Wt 139 lb 9.6 oz (63.3 kg)   SpO2 97%   BMI 27.22 kg/m   Appears  well nourished, well groomed  and in no distress.  Eyes: PERRLA, EOMs, conjunctiva no swelling or erythema. Sinuses: No frontal/maxillary tenderness ENT/Mouth: EAC's clear, TM's nl w/o erythema, bulging. Nares clear w/o erythema, swelling, exudates. Oropharynx clear without erythema or exudates. Oral hygiene is good. Tongue normal, non obstructing. Hearing intact.  Neck: Supple. Thyroid not palpable. Car 2+/2+ without bruits, nodes or JVD. Chest: Respirations nl with BS clear & equal w/o rales, rhonchi, wheezing or stridor.  Cor: Heart sounds normal w/ regular rate and rhythm without sig. murmurs, gallops, clicks or rubs. Peripheral pulses normal and equal  without edema.  Abdomen: Soft  & bowel sounds normal. Non-tender w/o guarding, rebound, hernias, masses or organomegaly.  Lymphatics: Unremarkable.  Musculoskeletal: Full ROM all peripheral extremities, joint stability, 5/5 strength and normal gait.  Skin: Warm, dry without exposed rashes, lesions or ecchymosis apparent.  Neuro: Cranial nerves intact, reflexes equal bilaterally. Sensory-motor testing grossly intact. Tendon reflexes grossly intact.  Pysch: Alert & oriented x 3.  Insight and judgement nl & appropriate. No ideations.  Assessment and Plan:  1. Essential hypertension  - Continue medication, monitor blood pressure at home.  - Continue DASH diet.  Reminder to go to the ER if any CP,  SOB, nausea, dizziness, severe HA, changes vision/speech.    - CBC with Differential/Platelet -  COMPLETE METABOLIC PANEL WITH GFR - Magnesium - TSH  2. Hyperlipidemia, mixed  - Continue diet/meds, exercise,& lifestyle modifications.  - Continue monitor periodic cholesterol/liver & renal functions     - Lipid panel - TSH  3. Abnormal glucose  - Continue diet, exercise  - Lifestyle modifications.  - Monitor appropriate labs   - Hemoglobin A1c - Insulin, random  4. Vitamin D deficiency  - Continue supplementation.    - VITAMIN D 25 Hydroxy  5. Medication management  - CBC with Differential/Platelet - COMPLETE METABOLIC PANEL WITH GFR - Magnesium - Lipid panel - TSH - Hemoglobin A1c - Insulin, random - VITAMIN D 25 Hydroxy         Discussed  regular exercise, BP monitoring, weight control to achieve/maintain BMI less than 25 and discussed med and SE's. Recommended labs to assess and monitor clinical status with further disposition pending results of labs.  I discussed the assessment and treatment plan with the patient. The patient was provided an opportunity to ask questions and all were answered. The patient agreed with the plan and demonstrated an understanding of the instructions.  I provided over 30  minutes of exam, counseling, chart review and  complex critical decision making.        The patient was advised to call back or seek an in-person evaluation if the symptoms worsen or if the condition fails to improve as anticipated.   Marinus Maw, MD

## 2021-06-16 ENCOUNTER — Ambulatory Visit (INDEPENDENT_AMBULATORY_CARE_PROVIDER_SITE_OTHER): Payer: Medicare Other | Admitting: Internal Medicine

## 2021-06-16 ENCOUNTER — Encounter: Payer: Self-pay | Admitting: Internal Medicine

## 2021-06-16 ENCOUNTER — Other Ambulatory Visit: Payer: Self-pay

## 2021-06-16 VITALS — BP 134/72 | HR 75 | Temp 97.5°F | Resp 16 | Ht 60.05 in | Wt 139.6 lb

## 2021-06-16 DIAGNOSIS — I10 Essential (primary) hypertension: Secondary | ICD-10-CM

## 2021-06-16 DIAGNOSIS — R7309 Other abnormal glucose: Secondary | ICD-10-CM

## 2021-06-16 DIAGNOSIS — E559 Vitamin D deficiency, unspecified: Secondary | ICD-10-CM

## 2021-06-16 DIAGNOSIS — Z79899 Other long term (current) drug therapy: Secondary | ICD-10-CM

## 2021-06-16 DIAGNOSIS — K219 Gastro-esophageal reflux disease without esophagitis: Secondary | ICD-10-CM

## 2021-06-16 DIAGNOSIS — E782 Mixed hyperlipidemia: Secondary | ICD-10-CM

## 2021-06-16 MED ORDER — BENZONATATE 200 MG PO CAPS: ORAL_CAPSULE | ORAL | 3 refills | Status: DC

## 2021-06-16 MED ORDER — ESOMEPRAZOLE MAGNESIUM 40 MG PO CPDR: DELAYED_RELEASE_CAPSULE | ORAL | 3 refills | Status: DC

## 2021-06-17 ENCOUNTER — Encounter: Payer: Self-pay | Admitting: Internal Medicine

## 2021-06-17 NOTE — Progress Notes (Signed)
============================================================ -   Test results slightly outside the reference range are not unusual. If there is anything important, I will review this with you,  otherwise it is considered normal test values.  If you have further questions,  please do not hesitate to contact me at the office or via My Chart.  ============================================================ ============================================================  -  Total Chol = 157      &      LDL Chol = 58         -       Both  Excellent   - Very low risk for Heart Attack  / Stroke ============================================================ ============================================================  -  A1c - Normal - No Diabetes - Great   ! ============================================================ ============================================================  - Vitamin D = 88  - Excellent  ============================================================ ============================================================  - All Else - CBC - Kidneys - Electrolytes - Liver - Magnesium & Thyroid    - all  Normal / OK ============================================================ ============================================================

## 2021-06-19 LAB — LIPID PANEL
Cholesterol: 157 mg/dL (ref <200)
HDL: 85 mg/dL (ref >=50)
LDL Cholesterol (Calc): 58 mg/dL (calc)
Non-HDL Cholesterol (Calc): 72 mg/dL (calc) (ref <130)
Total CHOL/HDL Ratio: 1.8 (calc) (ref <5.0)
Triglycerides: 61 mg/dL (ref <150)

## 2021-06-19 LAB — COMPLETE METABOLIC PANEL WITH GFR
AG Ratio: 2 (calc) (ref 1.0–2.5)
ALT: 19 U/L (ref 6–29)
AST: 28 U/L (ref 10–35)
Albumin: 4.6 g/dL (ref 3.6–5.1)
Alkaline phosphatase (APISO): 79 U/L (ref 37–153)
BUN: 16 mg/dL (ref 7–25)
CO2: 26 mmol/L (ref 20–32)
Calcium: 9.7 mg/dL (ref 8.6–10.4)
Chloride: 102 mmol/L (ref 98–110)
Creat: 0.86 mg/dL (ref 0.60–1.00)
Globulin: 2.3 g/dL (calc) (ref 1.9–3.7)
Glucose, Bld: 100 mg/dL — ABNORMAL HIGH (ref 65–99)
Potassium: 4.4 mmol/L (ref 3.5–5.3)
Sodium: 137 mmol/L (ref 135–146)
Total Bilirubin: 0.4 mg/dL (ref 0.2–1.2)
Total Protein: 6.9 g/dL (ref 6.1–8.1)
eGFR: 70 mL/min/{1.73_m2} (ref >=60)

## 2021-06-19 LAB — CBC WITH DIFFERENTIAL/PLATELET
Absolute Monocytes: 482 cells/uL (ref 200–950)
Basophils Absolute: 52 cells/uL (ref 0–200)
Basophils Relative: 1.2 %
Eosinophils Absolute: 90 cells/uL (ref 15–500)
Eosinophils Relative: 2.1 %
HCT: 41.9 % (ref 35.0–45.0)
Hemoglobin: 13.9 g/dL (ref 11.7–15.5)
Lymphs Abs: 654 cells/uL — ABNORMAL LOW (ref 850–3900)
MCH: 29.6 pg (ref 27.0–33.0)
MCHC: 33.2 g/dL (ref 32.0–36.0)
MCV: 89.3 fL (ref 80.0–100.0)
MPV: 9 fL (ref 7.5–12.5)
Monocytes Relative: 11.2 %
Neutro Abs: 3023 cells/uL (ref 1500–7800)
Neutrophils Relative %: 70.3 %
Platelets: 281 10*3/uL (ref 140–400)
RBC: 4.69 10*6/uL (ref 3.80–5.10)
RDW: 12.4 % (ref 11.0–15.0)
Total Lymphocyte: 15.2 %
WBC: 4.3 10*3/uL (ref 3.8–10.8)

## 2021-06-19 LAB — TSH: TSH: 1.93 mIU/L (ref 0.40–4.50)

## 2021-06-19 LAB — VITAMIN D 25 HYDROXY (VIT D DEFICIENCY, FRACTURES): Vit D, 25-Hydroxy: 88 ng/mL (ref 30–100)

## 2021-06-19 LAB — MAGNESIUM: Magnesium: 2 mg/dL (ref 1.5–2.5)

## 2021-06-19 LAB — HEMOGLOBIN A1C
Hgb A1c MFr Bld: 5.3 % of total Hgb (ref <5.7)
Mean Plasma Glucose: 105 mg/dL
eAG (mmol/L): 5.8 mmol/L

## 2021-06-19 LAB — INSULIN, RANDOM: Insulin: 6.6 u[IU]/mL

## 2021-06-28 ENCOUNTER — Other Ambulatory Visit: Payer: Self-pay | Admitting: Internal Medicine

## 2021-06-28 MED ORDER — PROMETHAZINE-DM 6.25-15 MG/5ML PO SYRP: ORAL_SOLUTION | ORAL | 1 refills | Status: DC

## 2021-06-28 MED ORDER — DEXAMETHASONE 4 MG PO TABS: ORAL_TABLET | ORAL | 0 refills | Status: DC

## 2021-08-25 ENCOUNTER — Other Ambulatory Visit: Payer: Self-pay | Admitting: Internal Medicine

## 2021-08-25 DIAGNOSIS — Z1231 Encounter for screening mammogram for malignant neoplasm of breast: Secondary | ICD-10-CM

## 2021-09-01 NOTE — Progress Notes (Signed)
MEDICARE ANNUAL WELLNESS VISIT AND FOLLOW UP  Assessment:    Annual Medicare Wellness Visit Due annually  Health maintenance reviewed  Essential hypertension - continue medications, DASH diet, exercise and monitor at home. Call if greater than 130/80.  -     CBC with Differential/Platelet -     CMP/GFR -     TSH  SVT (supraventricular tachycardia) (HCC) Had ED visits x 2, saw cardiology Controlled with atenolol  No symptoms at this time Continue cardio follow up as recommended  GERD Continue PPI, persistent cough improved, take PPI at night Next OV plan attempt to taper dose Diet/lifestyle discussed; raise HOB on blocks, avoid large/fatty meals, avoid reclining 4 hours after meals  Hyperlipidemia -continue medications, check lipids q62m, decrease fatty foods, increase activity.   Other abnormal glucose Recent A1Cs at goal Discussed diet/exercise, weight management  Defer A1C; check CMP  Vitamin D deficiency At goal at recent check; continue to recommend supplementation for goal of 70-100 Defer vitamin D level  Overweight Lifestyle discussed; increase exercise  Insomnia/anxiety Trazodone 75 mg and melatonin 5 mg working well; Stress management techniques discussed, increase water, good sleep hygiene discussed, increase exercise, and increase veggies.   Family history of CAD Female family members with MI and CAD requiring stents Had normal myoview 09/2018 by Dr. Eden Emms Control blood pressure, cholesterol, glucose, increase exercise.   Osteopenia - get dexa q2y, continue Vit D and Ca, weight bearing exercises  Vertigo Dexamethasone/meclizine PRN     Over 30 minutes of exam, counseling, chart review, and critical decision making was performed  Future Appointments  Date Time Provider Department Center  09/26/2021  1:40 PM [facility] 2 [facility]  12/20/2021  3:00 PM Lucky Cowboy, MD [facility] None  09/05/2022 11:00 AM Judd Gaudier, NP  [facility] None     Plan:   During the course of the visit the patient was educated and counseled about appropriate screening and preventive services including:   Pneumococcal vaccine  Influenza vaccine Td vaccine Prevnar 13 Screening electrocardiogram Screening mammography Bone densitometry screening Colorectal cancer screening Diabetes screening Glaucoma screening Nutrition counseling  Advanced directives: given info/requested copies   Subjective:   Jean Drake is a 75 y.o. female who presents for Medicare Annual Wellness Visit and 3 month follow up on hypertension, prediabetes, hyperlipidemia, vitamin D def.   Husband passed away in 03/01/19, has occasional down moments (never full days) but does regular devotions and gratefulness/awareness and managing very well. She is prescribed trazodone 75 mg for mood/insomnia, also uses 5 mg melatonin, tapered off of xanax. Back to church has been helpful.   Hx of migraines, improved after retiring, now with headaches that respond well to tylenol.   She has flares of vertigo, inner hear L >R for many years with intermittent flares, has meclizine and PRN dexamethasone that helps.   She had persistent cough, now on nexium with perceived benefit, was having breakthrough with omzeprazole OTC. She alternates allegra/zyrtec for allergies.   BMI is Body mass index is 27.88 kg/m., she has been working on diet and exercise, does walk daily 15-20 min, also very active in yard, mowing several acres.  Wt Readings from Last 3 Encounters:  09/05/21 143 lb (64.9 kg)  06/16/21 139 lb 9.6 oz (63.3 kg)  03/20/21 142 lb (64.4 kg)   She has hx of episodes of SVT for which she presented to ED x2; improved with addition of atenolol.  She has family hx of CAD, underwent myoview 09/2018  by Dr. Eden Emms which was normal.   Her blood pressure has been controlled at home, today their BP is BP: 138/72 She does workout. She denies chest pain, shortness of  breath, dizziness.   She is on cholesterol medication (rosuvastatin 10 mg daily). Her cholesterol is not at goal. The cholesterol last visit was:   Lab Results  Component Value Date   CHOL 157 06/16/2021   HDL 85 06/16/2021   LDLCALC 58 06/16/2021   TRIG 61 06/16/2021   CHOLHDL 1.8 06/16/2021    Last A1C in the office was:  Lab Results  Component Value Date   HGBA1C 5.3 06/16/2021   CKD II monitored here. Last GFR Lab Results  Component Value Date   GFRNONAA 77 03/20/2021   Patient is on Vitamin D supplement. Lab Results  Component Value Date   VD25OH 88 06/16/2021      Medication Review Current Outpatient Medications on File Prior to Visit  Medication Sig Dispense Refill   Ascorbic Acid (VITAMIN C PO) Take 1,000 mg by mouth daily.      aspirin 81 MG tablet Take 81 mg by mouth daily.     atenolol (TENORMIN) 50 MG tablet TAKE ONE TABLET BY MOUTH EVERY DAY FOR BLOOD PRESSURE 90 tablet 3   benzonatate (TESSALON) 200 MG capsule Take  1 perle  3 x /day for  Cough 90 capsule 3   cetirizine (ZYRTEC) 10 MG tablet Take 10 mg by mouth daily.     Cholecalciferol (VITAMIN D) 2000 UNITS tablet Take 6,000 Units by mouth daily.      dexamethasone (DECADRON) 4 MG tablet Take 1 tab 3 x day - 3 days, then 2 x day - 3 days, then 1 tab daily 20 tablet 0   esomeprazole (NEXIUM) 40 MG capsule Take  1 capsule  Daily  to Prevent Acid Reflux & Hoarseness 90 capsule 3   MAGNESIUM OXIDE PO Take 400 mg by mouth daily.     meclizine (ANTIVERT) 25 MG tablet TAKE 1/2-1 TABLET BY MOUTH 2-3 TIMES DAILY AS NEEDED FOR dizziness/vertigo 90 tablet 11   Melatonin 5 MG TABS Take by mouth. Patient takes 3 tablets to equal 15 mg at bedtime.     Multiple Vitamin (MULTIVITAMIN) tablet Take 1 tablet by mouth daily.     multivitamin-lutein (OCUVITE-LUTEIN) CAPS capsule Take 1 capsule by mouth daily.     Omega-3 Fatty Acids (FISH OIL) 1000 MG CAPS Take 1 capsule by mouth daily.     OVER THE COUNTER MEDICATION Takes  Vitamin B Complex 1 daily     promethazine-dextromethorphan (PROMETHAZINE-DM) 6.25-15 MG/5ML syrup Take 1 tsp every 4 hours if needed for cough 240 mL 1   rosuvastatin (CRESTOR) 10 MG tablet TAKE ONE TABLET BY MOUTH DAILY FOR cholesterol 90 tablet 3   traZODone (DESYREL) 50 MG tablet take 1/2-2 tablets at bedtime as needed for sleep 180 tablet 3   TURMERIC PO Take 1,500 mg by mouth.     Zinc 50 MG TABS Take 50 mg by mouth daily.     No current facility-administered medications on file prior to visit.    Current Problems (verified) Patient Active Problem List   Diagnosis Date Noted   Vertigo 09/05/2021   Cough present for greater than 3 weeks 03/20/2021   CKD (chronic kidney disease) stage 2, GFR 60-89 ml/min 12/11/2020   Osteopenia 09/05/2018   FHx: heart disease 06/18/2018   Insomnia 12/06/2017   SVT (supraventricular tachycardia) (HCC) 11/15/2015   Overweight (BMI  25.0-29.9) 11/18/2014   Medication management 04/20/2014   Essential hypertension 10/20/2013   Hyperlipidemia, mixed 10/20/2013   Abnormal glucose 10/20/2013   Vitamin D deficiency 10/20/2013   GERD 10/20/2013    Screening Tests Immunization History  Administered Date(s) Administered   DTaP 09/12/2013   Influenza Split 09/12/2013   Influenza, High Dose Seasonal PF 10/04/2014, 08/30/2017   Pneumococcal Conjugate-13 10/04/2014   Pneumococcal Polysaccharide-23 03/29/2009   Td 12/20/2003   Tdap 09/12/2013   Zoster, Live 06/07/2010    Preventative care: Last colonoscopy: 2002, 2008 norm Cologuard: 08/2019 Last mammogram: 09/2020, has scheduled 09/26/2021 PAP/pelvic: 2019 at GYN, Dr. Ambrose Mantle following DEXA: 09/2020 spine T -2.2, calcium   Echo 12/2015 Myocardial perfusion: 09/2018  Prior vaccinations: TD or Tdap: 2014  Influenza: 2018 - declines  Pneumococcal: 2010 Prevnar13: 2015 Shingles/Zostavax: 2011 Covid 19: declines   Names of Other Physician/Practitioners you currently use: 1. [facility]- here for primary care 2. Dr. Dione Booze , eye doctor, last visit 2022 3. Dr. Irving Copas, dentist, last visit 2022 goes q51m  Patient Care Team: Lucky Cowboy, MD as PCP - General (Internal Medicine) Wendall Stade, MD as PCP - Cardiology (Cardiology) Louis Meckel, MD (Inactive) as Consulting Physician (Gastroenterology) Tracey Harries, MD as Consulting Physician (Obstetrics and Gynecology)  Allergies Allergies  Allergen Reactions   Penicillins Anaphylaxis   Codeine Nausea And Vomiting   Sudafed [Pseudoephedrine Hcl]     Makes heart race, per pt     SURGICAL HISTORY She  has a past surgical history that includes No past surgeries. FAMILY HISTORY Her family history includes Anuerysm in her sister; Breast cancer in her mother; CAD in her mother; Heart attack (age of onset: 12) in her sister; Heart disease in her mother; Kidney failure in her paternal uncle; Lung cancer in her father; Polycystic kidney disease in her paternal uncle; Stroke in her maternal grandmother. SOCIAL HISTORY She  reports that she has never smoked. She has never used smokeless tobacco. She reports that she does not drink alcohol.  MEDICARE WELLNESS OBJECTIVES: Physical activity: Current Exercise Habits: Home exercise routine, Type of exercise: walking, Time (Minutes): 15, Frequency (Times/Week): 2, Weekly Exercise (Minutes/Week): 30, Intensity: Mild, Exercise limited by: None identified Cardiac risk factors: Cardiac Risk Factors include: advanced age (>24men, >18 women);dyslipidemia;hypertension Depression/mood screen:   Depression screen [facility] 2/9 09/05/2021  Decreased Interest 0  Down, Depressed, Hopeless 0  PHQ - 2 Score 0    ADLs:  In your present state of health, do you have any difficulty performing the following activities: 09/05/2021 06/17/2021  Hearing? N N  Vision? N N  Difficulty concentrating or making decisions? N N  Walking or climbing stairs? N N  Dressing or  bathing? N N  Doing errands, shopping? N N  Some recent data might be hidden    Cognitive Testing  Alert? Yes  Normal Appearance?Yes  Oriented to person? Yes  Place? Yes   Time? Yes  Recall of three objects?  Yes  Can perform simple calculations? Yes  Displays appropriate judgment?Yes  Can read the correct time from a watch face?Yes  EOL planning: Does Patient Have a Medical Advance Directive?: Yes Type of Advance Directive: Healthcare Power of Attorney, Living will Does patient want to make changes to medical advance directive?: No - Patient declined Copy of Healthcare Power of Attorney in Chart?: No - copy requested   Objective:   Today's Vitals   09/05/21 1100 09/05/21 1145  BP: 140/72 138/72  Pulse: (!) 57   Temp: 97.7 F (36.5 C)   SpO2: 98%   Weight: 143 lb (64.9 kg)     Body mass index is 27.88 kg/m.  General appearance: alert, no distress, WD/WN,  female HEENT: normocephalic, sclerae anicteric, TMs pearly, nares patent, no discharge or erythema, pharynx normal Oral cavity: MMM, no lesions Neck: supple, no lymphadenopathy, no thyromegaly, no masses Heart: RRR, normal S1, S2, no murmurs Lungs: CTA bilaterally, no wheezes, rhonchi, or rales Abdomen: +bs, soft, non tender, + distended/full lower AB, no masses, no hepatomegaly, no splenomegaly Musculoskeletal: nontender, no swelling, no obvious deformity Extremities: no edema, no cyanosis, no clubbing Pulses: 2+ symmetric, upper and lower extremities, normal cap refill Neurological: alert, oriented x 3, CN2-12 intact, strength normal upper extremities and lower extremities, sensation normal throughout, DTRs 2+ throughout, no cerebellar signs, gait normal Psychiatric: normal affect, behavior normal, pleasant   Medicare Attestation I have personally reviewed: The patient's medical and social history Their use of alcohol, tobacco or illicit drugs Their current medications and supplements The patient's functional  ability including ADLs,fall risks, home safety risks, cognitive, and hearing and visual impairment Diet and physical activities Evidence for depression or mood disorders  The patient's weight, height, BMI, and visual acuity have been recorded in the chart.  I have made referrals, counseling, and provided education to the patient based on review of the above and I have provided the patient with a written personalized care plan for preventive services.     Dan Maker, NP   09/05/2021

## 2021-09-04 ENCOUNTER — Ambulatory Visit: Payer: Medicare Other | Admitting: Adult Health

## 2021-09-05 ENCOUNTER — Other Ambulatory Visit: Payer: Self-pay

## 2021-09-05 ENCOUNTER — Encounter: Payer: Self-pay | Admitting: Adult Health

## 2021-09-05 ENCOUNTER — Ambulatory Visit (INDEPENDENT_AMBULATORY_CARE_PROVIDER_SITE_OTHER): Payer: Medicare Other | Admitting: Adult Health

## 2021-09-05 VITALS — BP 138/72 | HR 57 | Temp 97.7°F | Wt 143.0 lb

## 2021-09-05 DIAGNOSIS — E782 Mixed hyperlipidemia: Secondary | ICD-10-CM | POA: Diagnosis not present

## 2021-09-05 DIAGNOSIS — E559 Vitamin D deficiency, unspecified: Secondary | ICD-10-CM

## 2021-09-05 DIAGNOSIS — E663 Overweight: Secondary | ICD-10-CM

## 2021-09-05 DIAGNOSIS — R7309 Other abnormal glucose: Secondary | ICD-10-CM

## 2021-09-05 DIAGNOSIS — K2100 Gastro-esophageal reflux disease with esophagitis, without bleeding: Secondary | ICD-10-CM

## 2021-09-05 DIAGNOSIS — I471 Supraventricular tachycardia: Secondary | ICD-10-CM | POA: Diagnosis not present

## 2021-09-05 DIAGNOSIS — R42 Dizziness and giddiness: Secondary | ICD-10-CM

## 2021-09-05 DIAGNOSIS — I10 Essential (primary) hypertension: Secondary | ICD-10-CM

## 2021-09-05 DIAGNOSIS — N182 Chronic kidney disease, stage 2 (mild): Secondary | ICD-10-CM

## 2021-09-05 DIAGNOSIS — R6889 Other general symptoms and signs: Secondary | ICD-10-CM

## 2021-09-05 DIAGNOSIS — M8588 Other specified disorders of bone density and structure, other site: Secondary | ICD-10-CM | POA: Diagnosis not present

## 2021-09-05 DIAGNOSIS — Z0000 Encounter for general adult medical examination without abnormal findings: Secondary | ICD-10-CM

## 2021-09-05 DIAGNOSIS — Z79899 Other long term (current) drug therapy: Secondary | ICD-10-CM

## 2021-09-05 DIAGNOSIS — F5101 Primary insomnia: Secondary | ICD-10-CM

## 2021-09-05 MED ORDER — DEXAMETHASONE 4 MG PO TABS: ORAL_TABLET | ORAL | 0 refills | Status: DC

## 2021-09-05 NOTE — Patient Instructions (Signed)
Jean Drake , Thank you for taking time to come for your Medicare Wellness Visit. I appreciate your ongoing commitment to your health goals. Please review the following plan we discussed and let me know if I can assist you in the future.   These are the goals we discussed:  Goals      Exercise 150 min/wk Moderate Activity        This is a list of the screening recommended for you and due dates:  Health Maintenance  Topic Date Due   COVID-19 Vaccine (1) Never done   Zoster (Shingles) Vaccine (1 of 2) Never done   Pneumonia Vaccine (3 - PPSV23 if available, else PCV20) 10/05/2015   Flu Shot  06/12/2021   Cologuard (Stool DNA test)  08/16/2022   Tetanus Vaccine  09/13/2023   DEXA scan (bone density measurement)  Completed   Hepatitis C Screening: USPSTF Recommendation to screen - Ages 16-79 yo.  Completed   HPV Vaccine  Aged Out    Please check calcium supplement dose  Resistance exercise every other day for bones   Preventing Osteoporosis, Adult Osteoporosis is a condition that causes the bones to lose density. This means that the bones become thinner, and the normal spaces in bone tissue become larger. Low bone density can make the bones weak and cause them to break more easily. Osteoporosis cannot always be prevented, but you can take steps to lower your risk of developing this condition. How can this condition affect me? If you develop osteoporosis, you will be more likely to break bones in your wrist, spine, or hip. Even a minor accident or injury can be enough to break weak bones. The bones will also be slower to heal. Osteoporosis can cause other problems as well, such as a stooped posture or trouble with movement. Osteoporosis can occur with aging. As you get older, you may lose bone tissue more quickly, or it may be replaced more slowly. Osteoporosis is more likely to develop if you have poor nutrition or do not get enough calcium or vitamin D. Other lifestyle factors can  also play a role. By eating a well-balanced diet and making lifestyle changes, you can help keep your bones strong and healthy, lowering your chances of developing osteoporosis. What can increase my risk? The following factors may make you more likely to develop osteoporosis: Having a family history of the condition. Having poor nutrition or not getting enough calcium or vitamin D. Using certain medicines, such as steroid medicines or anti-seizure medicines. Being any of the following: 65 years of age or older. Female. A woman who has gone through menopause (is postmenopausal). A person who is of European or Asian descent. Using products that contain nicotine or tobacco, such as cigarettes, e-cigarettes, and chewing tobacco. Not being physically active (being sedentary). Having a small body frame. What actions can I take to prevent this? Get enough calcium  Make sure you get enough calcium every day. Calcium is the most important mineral for bone health. Most people can get enough calcium from their diet, but supplements may be recommended for people who are at risk for osteoporosis. Follow these guidelines: If you are age 49 or younger, aim to get 1,000 milligrams (mg) of calcium every day. If you are older than age 50, aim to get 1,200 mg of calcium every day. Good sources of calcium include: Dairy products, such as low-fat or nonfat milk, cheese, and yogurt. Dark green leafy vegetables, such as bok choy and broccoli.  Foods that have had calcium added to them (calcium-fortified foods), such as orange juice, cereal, bread, soy beverages, and tofu products. Nuts, such as almonds. Check nutrition labels to see how much calcium is in a food or drink. Get enough vitamin D Try to get enough vitamin D every day. Vitamin D is the most essential vitamin for bone health. It helps the body absorb calcium. Follow these guidelines for how much vitamin D to get from food: If you are age 42 or  younger, aim to get at least 600 international units (IU) every day. Your health care provider may suggest more. If you are older than age 78, aim to get at least 800 international units every day. Your health care provider may suggest more. Good sources of vitamin D in your diet include: Egg yolks. Oily fish, such as salmon, sardines, and tuna. Milk and cereal fortified with vitamin D. Your body also makes vitamin D when you are out in the sun. Exposing the bare skin on your face, arms, legs, or back to the sun for no more than 30 minutes a day, 2 times a week is more than enough. Beyond that, make sure you use sunblock to protect your skin from sunburn, which increases your risk for skin cancer. Exercise  Stay active and get exercise every day. Ask your health care provider what types of exercise are best for you. Weight-bearing and strength-building activities are important for building and maintaining healthy bones. Some examples of these types of activities include: Walking and hiking. Jogging and running. Dancing. Gym exercises and lifting weights. Tennis and racquetball. Climbing stairs. Tai chi. Make other lifestyle changes Do not use any products that contain nicotine or tobacco, such as cigarettes, e-cigarettes, and chewing tobacco. If you need help quitting, ask your health care provider. Lose weight if you are overweight. If you drink alcohol: Limit how much you use to: 0-1 drink a day for women who are not pregnant. 0-2 drinks a day for men. Be aware of how much alcohol is in your drink. In the [place] one drink equals one 12 oz bottle of beer (355 mL), one 5 oz glass of wine (148 mL), or one 1 oz glass of hard liquor (44 mL). Where to find support If you need help making changes to prevent osteoporosis, talk with your health care provider. You can ask for a referral to a dietitian and a physical therapist. Where to find more information Learn more about osteoporosis  from: NIH Osteoporosis and Related [place]: www.bones.SouthExposed.es [place] Office on [employer]: [place] [place]: EquipmentWeekly.com.ee Summary Osteoporosis is a condition that causes weak bones that are more likely to break. Eat a healthy diet, making sure you get enough calcium and vitamin D, and stay active by getting regular exercise to help prevent osteoporosis. Other ways to reduce your risk of osteoporosis include maintaining a healthy weight and avoiding alcohol and products that contain nicotine or tobacco. This information is not intended to replace advice given to you by your health care provider. Make sure you discuss any questions you have with your health care provider. Document Revised: 04/14/2020 Document Reviewed: 04/14/2020 Elsevier Patient Education  [place].

## 2021-09-06 LAB — CBC WITH DIFFERENTIAL/PLATELET
Absolute Monocytes: 873 cells/uL (ref 200–950)
Basophils Absolute: 50 cells/uL (ref 0–200)
Basophils Relative: 0.7 %
Eosinophils Absolute: 99 cells/uL (ref 15–500)
Eosinophils Relative: 1.4 %
HCT: 42.5 % (ref 35.0–45.0)
Hemoglobin: 14.1 g/dL (ref 11.7–15.5)
Lymphs Abs: 1484 cells/uL (ref 850–3900)
MCH: 29.6 pg (ref 27.0–33.0)
MCHC: 33.2 g/dL (ref 32.0–36.0)
MCV: 89.3 fL (ref 80.0–100.0)
MPV: 9.2 fL (ref 7.5–12.5)
Monocytes Relative: 12.3 %
Neutro Abs: 4594 cells/uL (ref 1500–7800)
Neutrophils Relative %: 64.7 %
Platelets: 268 10*3/uL (ref 140–400)
RBC: 4.76 10*6/uL (ref 3.80–5.10)
RDW: 12.1 % (ref 11.0–15.0)
Total Lymphocyte: 20.9 %
WBC: 7.1 10*3/uL (ref 3.8–10.8)

## 2021-09-06 LAB — COMPLETE METABOLIC PANEL WITH GFR
AG Ratio: 1.8 (calc) (ref 1.0–2.5)
ALT: 23 U/L (ref 6–29)
AST: 25 U/L (ref 10–35)
Albumin: 4.2 g/dL (ref 3.6–5.1)
Alkaline phosphatase (APISO): 66 U/L (ref 37–153)
BUN: 16 mg/dL (ref 7–25)
CO2: 26 mmol/L (ref 20–32)
Calcium: 9.6 mg/dL (ref 8.6–10.4)
Chloride: 103 mmol/L (ref 98–110)
Creat: 0.79 mg/dL (ref 0.60–1.00)
Globulin: 2.4 g/dL (calc) (ref 1.9–3.7)
Glucose, Bld: 90 mg/dL (ref 65–99)
Potassium: 3.9 mmol/L (ref 3.5–5.3)
Sodium: 137 mmol/L (ref 135–146)
Total Bilirubin: 0.4 mg/dL (ref 0.2–1.2)
Total Protein: 6.6 g/dL (ref 6.1–8.1)
eGFR: 78 mL/min/{1.73_m2} (ref >=60)

## 2021-09-06 LAB — MAGNESIUM: Magnesium: 2 mg/dL (ref 1.5–2.5)

## 2021-09-06 LAB — TSH: TSH: 3.72 mIU/L (ref 0.40–4.50)

## 2021-09-12 ENCOUNTER — Other Ambulatory Visit: Payer: Self-pay | Admitting: Adult Health

## 2021-09-12 ENCOUNTER — Telehealth: Payer: Self-pay

## 2021-09-12 MED ORDER — AZITHROMYCIN 250 MG PO TABS: ORAL_TABLET | ORAL | 1 refills | Status: AC

## 2021-09-12 MED ORDER — PREDNISONE 20 MG PO TABS: ORAL_TABLET | ORAL | 0 refills | Status: DC

## 2021-09-12 NOTE — Telephone Encounter (Signed)
Was seen in our office for Vertigo but not feeling any better. Going to the beach Saturday and worried about it.

## 2021-09-12 NOTE — Telephone Encounter (Signed)
Patient is requesting a Z-pak and Prednisone. This has worked in the past.  Please send into [facility].

## 2021-09-14 ENCOUNTER — Telehealth: Payer: Medicare Other

## 2021-09-14 DIAGNOSIS — U071 COVID-19: Secondary | ICD-10-CM | POA: Diagnosis not present

## 2021-09-14 MED ORDER — PROMETHAZINE-DM 6.25-15 MG/5ML PO SYRP: ORAL_SOLUTION | ORAL | 1 refills | Status: DC

## 2021-09-14 NOTE — Telephone Encounter (Signed)
Tested positive for Covid. Currently taking a Z-pak and O2 in 94. Has a cough and wanting to know what she should be taking. States that she can be reached via MyChart.

## 2021-09-14 NOTE — Telephone Encounter (Signed)
THIS ENCOUNTER IS A VIRTUAL VISIT DUE TO COVID-19 - PATIENT WAS NOT SEEN IN THE OFFICE.  PATIENT HAS CONSENTED TO VIRTUAL VISIT / TELEMEDICINE VISIT   Virtual Visit via telephone Note  I connected with  Jean Drake on 09/14/2021 by telephone.  I verified that I am speaking with the correct person using two identifiers.    I discussed the limitations of evaluation and management by telemedicine and the availability of in person appointments. The patient expressed understanding and agreed to proceed.  History of Present Illness:  75 y.o. patient contacted office reporting URI sx. she tested positive by rapid home screen yesterday. OV was conducted by telephone to minimize exposure. This patient was not vaccinated for covid 19.   She reports had mild/vague progressive sx for several weeks, was having flare of vertigo treated with steroids (prednisone taper) and meclizine; unsure when sx fully started, finally checked for covid 19 and was + yesterday (09/13/2021), today reports stuffy head, dry cough without dyspnea/wheezing (mostly at night). She reports low grade temp ~100 easily controlled by tylenol and ibuprofen.   She reports had O2 monitor showing 88% but shuts off immediately, but she believes machine is old/broken, can ambulate briskly in home without any dyspnea. Can hold breath 20 sec.   She reports taking ibuprofen/tylenol, prednisone taper was started on Monday, also started a zpak  100 fluid ounces of water intake daily  Has been taking tessalon for cough with limited benefit  Exposures: family   Medications  Current Outpatient Medications (Endocrine & Metabolic):    predniSONE (DELTASONE) 20 MG tablet, 2 tablets daily for 3 days, 1 tablet daily for 4 days.  Current Outpatient Medications (Cardiovascular):    atenolol (TENORMIN) 50 MG tablet, TAKE ONE TABLET BY MOUTH EVERY DAY FOR BLOOD PRESSURE   rosuvastatin (CRESTOR) 10 MG tablet, TAKE ONE TABLET BY MOUTH DAILY FOR  cholesterol  Current Outpatient Medications (Respiratory):    benzonatate (TESSALON) 200 MG capsule, Take  1 perle  3 x /day for  Cough   cetirizine (ZYRTEC) 10 MG tablet, Take 10 mg by mouth daily.   promethazine-dextromethorphan (PROMETHAZINE-DM) 6.25-15 MG/5ML syrup, Take 1 tsp every 4 hours if needed for cough  Current Outpatient Medications (Analgesics):    aspirin 81 MG tablet, Take 81 mg by mouth daily.   Current Outpatient Medications (Other):    Ascorbic Acid (VITAMIN C PO), Take 1,000 mg by mouth daily.    azithromycin (ZITHROMAX) 250 MG tablet, Take 2 tablets (500 mg) on  Day 1,  followed by 1 tablet (250 mg) once daily on Days 2 through 5.   Cholecalciferol (VITAMIN D) 2000 UNITS tablet, Take 6,000 Units by mouth daily.    esomeprazole (NEXIUM) 40 MG capsule, Take  1 capsule  Daily  to Prevent Acid Reflux & Hoarseness   MAGNESIUM OXIDE PO*, Take 400 mg by mouth daily.   meclizine (ANTIVERT) 25 MG tablet, TAKE 1/2-1 TABLET BY MOUTH 2-3 TIMES DAILY AS NEEDED FOR dizziness/vertigo   Melatonin 5 MG TABS, Take by mouth. Patient takes 3 tablets to equal 15 mg at bedtime.   Multiple Vitamin (MULTIVITAMIN) tablet, Take 1 tablet by mouth daily.   multivitamin-lutein (OCUVITE-LUTEIN) CAPS capsule, Take 1 capsule by mouth daily.   Omega-3 Fatty Acids (FISH OIL) 1000 MG CAPS, Take 1 capsule by mouth daily.   OVER THE COUNTER MEDICATION, Takes Vitamin B Complex 1 daily   traZODone (DESYREL) 50 MG tablet, take 1/2-2 tablets at bedtime as needed for sleep  TURMERIC PO, Take 1,500 mg by mouth.   Zinc 50 MG TABS, Take 50 mg by mouth daily. * These medications belong to multiple therapeutic classes and are listed under each applicable group.  Allergies:  Allergies  Allergen Reactions   Penicillins Anaphylaxis   Codeine Nausea And Vomiting   Sudafed [Pseudoephedrine Hcl]     Makes heart race, per pt     Problem list She has Essential hypertension; Hyperlipidemia, mixed; Abnormal  glucose; Vitamin D deficiency; GERD; Medication management; Overweight (BMI 25.0-29.9); SVT (supraventricular tachycardia) (HCC); Insomnia; FHx: heart disease; Osteopenia; CKD (chronic kidney disease) stage 2, GFR 60-89 ml/min; Cough present for greater than 3 weeks; Vertigo; and COVID-19 (09/13/2021) on their problem list.   Social History:   reports that she has never smoked. She has never used smokeless tobacco. She reports that she does not drink alcohol. No history on file for drug use.  Observations/Objective:  General : Well sounding patient in no apparent distress HEENT: no hoarseness, no cough for duration of visit Lungs: speaks in complete sentences, no audible wheezing, no apparent distress Neurological: alert, oriented x 3 Psychiatric: pleasant, judgement appropriate   Assessment and Plan:  Covid 19 Covid 19 positive per rapid screening test in unvaccinated Risk factors include: age, htn Symptoms are: mild Due to co morbid conditions and risk factors, discussed antivirals - decilnes Immue support reviewed  Take tylenol PRN temp 101+ Push hydration Regular ambulation or calf exercises exercises for clot prevention and 81 mg ASA unless contraindicated Sx supportive therapy suggested Follow up via mychart or telephone if needed Advised patient obtain new O2 monitor; present to ED if persistently <90% or with severe dyspnea, CP, fever uncontrolled by tylenol, confusion, sudden decline Should remain in isolation until at least 5 days from onset of sx, 24-48 hours fever free without tylenol, sx such as cough are improved.  -     promethazine-dextromethorphan (PROMETHAZINE-DM) 6.25-15 MG/5ML syrup; Take 1 tsp every 4 hours if needed for cough    Follow Up Instructions:  I discussed the assessment and treatment plan with the patient. The patient was provided an opportunity to ask questions and all were answered. The patient agreed with the plan and demonstrated an understanding  of the instructions.   The patient was advised to call back or seek an in-person evaluation if the symptoms worsen or if the condition fails to improve as anticipated.  I provided 15 minutes of non-face-to-face time during this encounter.  Dan Maker, NP

## 2021-09-14 NOTE — Addendum Note (Signed)
Addended by: Dan Maker on: 09/14/2021 01:35 PM   Modules accepted: Orders, Level of Service

## 2021-09-26 ENCOUNTER — Other Ambulatory Visit: Payer: Self-pay

## 2021-09-26 ENCOUNTER — Ambulatory Visit
Admission: RE | Admit: 2021-09-26 | Discharge: 2021-09-26 | Disposition: A | Discharge status: Home / Self Care | Payer: Medicare Other | Source: Ambulatory Visit | Attending: Internal Medicine | Admitting: Internal Medicine

## 2021-09-26 DIAGNOSIS — Z1231 Encounter for screening mammogram for malignant neoplasm of breast: Secondary | ICD-10-CM | POA: Diagnosis not present

## 2021-10-11 ENCOUNTER — Other Ambulatory Visit: Payer: Self-pay | Admitting: Adult Health

## 2021-12-11 ENCOUNTER — Other Ambulatory Visit: Payer: Self-pay | Admitting: Adult Health

## 2021-12-20 ENCOUNTER — Other Ambulatory Visit: Payer: Self-pay

## 2021-12-20 ENCOUNTER — Ambulatory Visit (INDEPENDENT_AMBULATORY_CARE_PROVIDER_SITE_OTHER): Payer: Medicare Other | Admitting: Internal Medicine

## 2021-12-20 ENCOUNTER — Encounter: Payer: Self-pay | Admitting: Internal Medicine

## 2021-12-20 VITALS — BP 118/64 | HR 72 | Temp 97.7°F | Ht 60.5 in | Wt 146.8 lb

## 2021-12-20 DIAGNOSIS — N182 Chronic kidney disease, stage 2 (mild): Secondary | ICD-10-CM

## 2021-12-20 DIAGNOSIS — I10 Essential (primary) hypertension: Secondary | ICD-10-CM | POA: Diagnosis not present

## 2021-12-20 DIAGNOSIS — Z136 Encounter for screening for cardiovascular disorders: Secondary | ICD-10-CM | POA: Diagnosis not present

## 2021-12-20 DIAGNOSIS — Z0000 Encounter for general adult medical examination without abnormal findings: Secondary | ICD-10-CM | POA: Diagnosis not present

## 2021-12-20 DIAGNOSIS — Z1212 Encounter for screening for malignant neoplasm of rectum: Secondary | ICD-10-CM

## 2021-12-20 DIAGNOSIS — E559 Vitamin D deficiency, unspecified: Secondary | ICD-10-CM

## 2021-12-20 DIAGNOSIS — Z0001 Encounter for general adult medical examination with abnormal findings: Secondary | ICD-10-CM

## 2021-12-20 DIAGNOSIS — Z1211 Encounter for screening for malignant neoplasm of colon: Secondary | ICD-10-CM

## 2021-12-20 DIAGNOSIS — I471 Supraventricular tachycardia: Secondary | ICD-10-CM

## 2021-12-20 DIAGNOSIS — R42 Dizziness and giddiness: Secondary | ICD-10-CM

## 2021-12-20 DIAGNOSIS — E782 Mixed hyperlipidemia: Secondary | ICD-10-CM | POA: Diagnosis not present

## 2021-12-20 DIAGNOSIS — R7309 Other abnormal glucose: Secondary | ICD-10-CM

## 2021-12-20 DIAGNOSIS — Z8249 Family history of ischemic heart disease and other diseases of the circulatory system: Secondary | ICD-10-CM

## 2021-12-20 DIAGNOSIS — Z79899 Other long term (current) drug therapy: Secondary | ICD-10-CM

## 2021-12-20 DIAGNOSIS — K219 Gastro-esophageal reflux disease without esophagitis: Secondary | ICD-10-CM

## 2021-12-20 DIAGNOSIS — H8110 Benign paroxysmal vertigo, unspecified ear: Secondary | ICD-10-CM

## 2021-12-20 MED ORDER — MECLIZINE HCL 25 MG PO TABS: ORAL_TABLET | ORAL | 3 refills | Status: DC

## 2021-12-20 MED ORDER — DEXAMETHASONE 4 MG PO TABS: ORAL_TABLET | ORAL | 1 refills | Status: DC

## 2021-12-20 NOTE — Patient Instructions (Addendum)
Due to recent changes in healthcare laws, you may see the results of your imaging and laboratory studies on MyChart before your provider has had a chance to review them.  We understand that in some cases there may be results that are confusing or concerning to you. Not all laboratory results come back in the same time frame and the provider may be waiting for multiple results in order to interpret others.  Please give [place] 48 hours in order for your provider to thoroughly review all the results before contacting the office for clarification of your results.   +++++++++++++++++++++++++  Vit D  & Vit C 1,000 mg   are recommended to help protect  against the Covid-19 and other Corona viruses.    Also it's recommended  to take  Zinc 50 mg  to help  protect against the Covid-19   and best place to get  is also on [email or web address]  and don't pay more than 6-8 cents /pill !  ================================ Benign Positional Vertigo Vertigo is the feeling that you or your surroundings are moving when they are not. Benign positional vertigo is the most common form of vertigo. This is usually a harmless condition (benign). This condition is positional. This means that symptoms are triggered by certain movements and positions. This condition can be dangerous if it occurs while you are doing something that could cause harm to yourself or others. This includes activities such as driving or operating machinery. What are the causes? The inner ear has fluid-filled canals that help your brain sense movement and balance. When the fluid moves, the brain receives messages about your body's position. With benign positional vertigo, calcium crystals in the inner ear break free and disturb the inner ear area. This causes your brain to receive confusing messages about your body's position. What increases the risk? You are more likely to develop this condition if: You are a woman. You are 76 years of age or older. You  have recently had a head injury. You have an inner ear disease. What are the signs or symptoms? Symptoms of this condition usually happen when you move your head or your eyes in different directions. Symptoms may start suddenly and usually last for less than a minute. They include: Loss of balance and falling. Feeling like you are spinning or moving. Feeling like your surroundings are spinning or moving. Nausea and vomiting. Blurred vision. Dizziness. Involuntary eye movement (nystagmus). Symptoms can be mild and cause only minor problems, or they can be severe and interfere with daily life. Episodes of benign positional vertigo may return (recur) over time. Symptoms may also improve over time. How is this diagnosed? This condition may be diagnosed based on: Your medical history. A physical exam of the head, neck, and ears. Positional tests to check for or stimulate vertigo. You may be asked to turn your head and change positions, such as going from sitting to lying down. A health care provider will watch for symptoms of vertigo. You may be referred to a health care provider who specializes in ear, nose, and throat problems (ENT or otolaryngologist) or a provider who specializes in disorders of the nervous system (neurologist). How is this treated? This condition may be treated in a session in which your health care provider moves your head in specific positions to help the displaced crystals in your inner ear move. Treatment for this condition may take several sessions. Surgery may be needed in severe cases, but this is rare. In some cases,  benign positional vertigo may resolve on its own in 2-4 weeks. Follow these instructions at home: Safety Move slowly. Avoid sudden body or head movements or certain positions, as told by your health care provider. Avoid driving or operating machinery until your health care provider says it is safe. Avoid doing any tasks that would be dangerous to you or  others if vertigo occurs. If you have trouble walking or keeping your balance, try using a cane for stability. If you feel dizzy or unstable, sit down right away. Return to your normal activities as told by your health care provider. Ask your health care provider what activities are safe for you. General instructions Take over-the-counter and prescription medicines only as told by your health care provider. Drink enough fluid to keep your urine pale yellow. Keep all follow-up visits. This is important. Contact a health care provider if: You have a fever. Your condition gets worse or you develop new symptoms. Your family or friends notice any behavioral changes. You have nausea or vomiting that gets worse. You have numbness or a prickling and tingling sensation. Get help right away if you: Have difficulty speaking or moving. Are always dizzy or faint. Develop severe headaches. Have weakness in your legs or arms. Have changes in your hearing or vision. Develop a stiff neck. Develop sensitivity to light. These symptoms may represent a serious problem that is an emergency. Do not wait to see if the symptoms will go away. Get medical help right away. Call your local emergency services (911 in the [place] Do not drive yourself to the hospital. Summary Vertigo is the feeling that you or your surroundings are moving when they are not. Benign positional vertigo is the most common form of vertigo. This condition is caused by calcium crystals in the inner ear that become displaced. This causes a disturbance in an area of the inner ear that helps your brain sense movement and balance. Symptoms include loss of balance and falling, feeling that you or your surroundings are moving, nausea and vomiting, and blurred vision. This condition can be diagnosed based on symptoms, a physical exam, and positional tests. Follow safety instructions as told by your health care provider and keep all follow-up visits.  This is important. This information is not intended to replace advice given to you by your health care provider. Make sure you discuss any questions you have with your health care provider. Document Revised: 09/28/2020 Document Reviewed: 09/28/2020 Elsevier Patient Education  2022 [employer].  ================================ How to Perform the Epley Maneuver  The Epley maneuver is an exercise that relieves symptoms of vertigo. Vertigo is the feeling that you or your surroundings are moving when they are not. When you feel vertigo, you may feel like the room is spinning and may have trouble walking. The Epley maneuver is used for a type of vertigo caused by a calcium deposit in a part of the inner ear. The maneuver involves changing head positions to help the deposit move out of the area. You can do this maneuver at home whenever you have symptoms of vertigo. You can repeat it in 24 hours if your vertigo has not gone away. Even though the Epley maneuver may relieve your vertigo for a few weeks, it is possible that your symptoms will return. This maneuver relieves vertigo, but it does not relieve dizziness. What are the risks? If it is done correctly, the Epley maneuver is considered safe. Sometimes it can lead to dizziness or nausea that goes away  after a short time. If you develop other symptoms--such as changes in vision, weakness, or numbness--stop doing the maneuver and call your health care provider. Supplies needed: A bed or table. A pillow. How to do the Epley maneuver   Sit on the edge of a bed or table with your back straight and your legs extended or hanging over the edge of the bed or table. Turn your head halfway toward the affected ear or side as told by your health care provider. Lie backward quickly with your head turned until you are lying flat on your back. Your head should dangle (head-hanging position). You may want to position a pillow under your shoulders. Hold this  position for at least 30 seconds. If you feel dizzy or have symptoms of vertigo, continue to hold the position until the symptoms stop. Turn your head to the opposite direction until your unaffected ear is facing down. Your head should continue to dangle. Hold this position for at least 30 seconds. If you feel dizzy or have symptoms of vertigo, continue to hold the position until the symptoms stop. Turn your whole body to the same side as your head so that you are positioned on your side. Your head will now be nearly facedown and no longer needs to dangle. Hold for at least 30 seconds. If you feel dizzy or have symptoms of vertigo, continue to hold the position until the symptoms stop. Sit back up. You can repeat the maneuver in 24 hours if your vertigo does not go away. Follow these instructions at home: For 24 hours after doing the Epley maneuver: Keep your head in an upright position. When lying down to sleep or rest, keep your head raised (elevated) with two or more pillows. Avoid excessive neck movements. Activity Do not drive or use machinery if you feel dizzy. After doing the Epley maneuver, return to your normal activities as told by your health care provider. Ask your health care provider what activities are safe for you. General instructions Drink enough fluid to keep your urine pale yellow. Do not drink alcohol. Take over-the-counter and prescription medicines only as told by your health care provider. Keep all follow-up visits. This is important. Preventing vertigo symptoms Ask your health care provider if there is anything you should do at home to prevent vertigo. He or she may recommend that you: Keep your head elevated with two or more pillows while you sleep. Do not sleep on the side of your affected ear. Get up slowly from bed. Avoid sudden movements during the day. Avoid extreme head positions or movement, such as looking up or bending over. Contact a health care provider  if: Your vertigo gets worse. You have other symptoms, including: Nausea. Vomiting. Headache. Get help right away if you: Have vision changes. Have a headache or neck pain that is severe or getting worse. Cannot stop vomiting. Have new numbness or weakness in any part of your body. These symptoms may represent a serious problem that is an emergency. Do not wait to see if the symptoms will go away. Get medical help right away. Call your local emergency services (911 in the [place] Do not drive yourself to the hospital. Summary Vertigo is the feeling that you or your surroundings are moving when they are not. The Epley maneuver is an exercise that relieves symptoms of vertigo. If the Epley maneuver is done correctly, it is considered safe. This information is not intended to replace advice given to you by your health  care provider. Make sure you discuss any questions you have with your health care provider. Document Revised: 09/28/2020 Document Reviewed: 09/28/2020 Elsevier Patient Education  2022 [employer].  ================================ Coronavirus (COVID-19) Are you at risk?  Are you at risk for the Coronavirus (COVID-19)?  To be considered HIGH RISK for Coronavirus (COVID-19), you have to meet the following criteria:  Traveled to [place], [place], [place], [place] or [place]; or in the [place] to [place], [place], [place]  or [place]; and have fever, cough, and shortness of breath within the last 2 weeks of travel OR Been in close contact with a person diagnosed with COVID-19 within the last 2 weeks and have  fever, cough,and shortness of breath  IF YOU DO NOT MEET THESE CRITERIA, YOU ARE CONSIDERED LOW RISK FOR COVID-19.  What to do if you are HIGH RISK for COVID-19?  If you are having a medical emergency, call 911. Seek medical care right away. Before you go to a doctors office, urgent care or emergency department,  call ahead and tell them about your  recent travel, contact with someone diagnosed with COVID-19   and your symptoms.  You should receive instructions from your physicians office regarding next steps of care.  When you arrive at healthcare provider, tell the healthcare staff immediately you have returned from  visiting [place], [place], [place], [place] or [place]; or traveled in the [place] to [place], [place],  [place] or [place] in the last two weeks or you have been in close contact with a person diagnosed with  COVID-19 in the last 2 weeks.   Tell the health care staff about your symptoms: fever, cough and shortness of breath. After you have been seen by a medical provider, you will be either: Tested for (COVID-19) and discharged home on quarantine except to seek medical care if  symptoms worsen, and asked to  Stay home and avoid contact with others until you get your results (4-5 days)  Avoid travel on public transportation if possible (such as bus, train, or airplane) or Sent to the Emergency Department by EMS for evaluation, COVID-19 testing  and  possible admission depending on your condition and test results.  What to do if you are LOW RISK for COVID-19?  Reduce your risk of any infection by using the same precautions used for avoiding the common cold or flu:  Wash your hands often with soap and warm water for at least 20 seconds.  If soap and water are not readily available,  use an alcohol-based hand sanitizer with at least 60% alcohol.  If coughing or sneezing, cover your mouth and nose by coughing or sneezing into the elbow areas of your shirt or coat,  into a tissue or into your sleeve (not your hands). Avoid shaking hands with others and consider head nods or verbal greetings only. Avoid touching your eyes, nose, or mouth with unwashed hands.  Avoid close contact with people who are sick. Avoid places or events with large numbers of people in one location, like concerts or sporting  events. Carefully consider travel plans you have or are making. If you are planning any travel outside or inside the [place], visit the [facility] webpage for the latest health notices. If you have some symptoms but not all symptoms, continue to monitor at home and seek medical attention  if your symptoms worsen. If you are having a medical emergency, call 911.   >>>>>>>>>>>>>>>>>>>>>>>>>>>>>>>>>  We Do  NOT Approve of [employer] > > > > > > > > > > > > > > > > > > > > > > > > > > > > > > > > > > > > > > >  Preventive Care for Adults  A healthy lifestyle and preventive care can promote health and wellness. Preventive health guidelines for women include the following key practices. A routine yearly physical is a good way to check with your health care provider about your health and preventive screening. It is a chance to share any concerns and updates on your health and to receive a thorough exam. Visit your dentist for a routine exam and preventive care every 6 months. Brush your teeth twice a day and floss once a day. Good oral hygiene prevents tooth decay and gum disease. The frequency of eye exams is based on your age, health, family medical history, use of contact lenses, and other factors. Follow your health care provider's recommendations for frequency of eye exams. Eat a healthy diet. Foods like vegetables, fruits, whole grains, low-fat dairy products, and lean protein foods contain the nutrients you need without too many calories. Decrease your intake of foods high in solid fats, added sugars, and salt. Eat the right amount of calories for you. Get information about a proper diet from your health care provider, if necessary. Regular physical exercise is one of the most important things you can do for your health. Most adults should get at least 150 minutes of moderate-intensity exercise (any activity that increases your heart rate and causes you to sweat) each week. In  addition, most adults need muscle-strengthening exercises on 2 or more days a week. Maintain a healthy weight. The body mass index (BMI) is a screening tool to identify possible weight problems. It provides an estimate of body fat based on height and weight. Your health care provider can find your BMI and can help you achieve or maintain a healthy weight. For adults 20 years and older: A BMI below 18.5 is considered underweight. A BMI of 18.5 to 24.9 is normal. A BMI of 25 to 29.9 is considered overweight. A BMI of 30 and above is considered obese. Maintain normal blood lipids and cholesterol levels by exercising and minimizing your intake of saturated fat. Eat a balanced diet with plenty of fruit and vegetables. If your lipid or cholesterol levels are high, you are over 50, or you are at high risk for heart disease, you may need your cholesterol levels checked more frequently. Ongoing high lipid and cholesterol levels should be treated with medicines if diet and exercise are not working. If you smoke, find out from your health care provider how to quit. If you do not use tobacco, do not start. Lung cancer screening is recommended for adults aged 72-80 years who are at high risk for developing lung cancer because of a history of smoking. A yearly low-dose CT scan of the lungs is recommended for people who have at least a 30-pack-year history of smoking and are a current smoker or have quit within the past 15 years. A pack year of smoking is smoking an average of 1 pack of cigarettes a day for 1 year (for example: 1 pack a day for 30 years or 2 packs a day for 15 years). Yearly screening should continue until the smoker has stopped smoking for at least 15 years. Yearly screening should be stopped for people who develop a health problem that would prevent  them from having lung cancer treatment. Avoid use of street drugs. Do not share needles with anyone. Ask for help if you need support or instructions about  stopping the use of drugs. High blood pressure causes heart disease and increases the risk of stroke.  Ongoing high blood pressure should be treated with medicines if weight loss and exercise do not work. If you are 71-29 years old, ask your health care provider if you should take aspirin to prevent strokes. Diabetes screening involves taking a blood sample to check your fasting blood sugar level. This should be done once every 3 years, after age 82, if you are within normal weight and without risk factors for diabetes. Testing should be considered at a younger age or be carried out more frequently if you are overweight and have at least 1 risk factor for diabetes. Breast cancer screening is essential preventive care for women. You should practice "breast self-awareness." This means understanding the normal appearance and feel of your breasts and may include breast self-examination. Any changes detected, no matter how small, should be reported to a health care provider. Women in their 22s and 30s should have a clinical breast exam (CBE) by a health care provider as part of a regular health exam every 1 to 3 years. After age 74, women should have a CBE every year. Starting at age 69, women should consider having a mammogram (breast X-ray test) every year. Women who have a family history of breast cancer should talk to their health care provider about genetic screening. Women at a high risk of breast cancer should talk to their health care providers about having an MRI and a mammogram every year. Breast cancer gene (BRCA)-related cancer risk assessment is recommended for women who have family members with BRCA-related cancers. BRCA-related cancers include breast, ovarian, tubal, and peritoneal cancers. Having family members with these cancers may be associated with an increased risk for harmful changes (mutations) in the breast cancer genes BRCA1 and BRCA2. Results of the assessment will determine the need for  genetic counseling and BRCA1 and BRCA2 testing. Routine pelvic exams to screen for cancer are no longer recommended for nonpregnant women who are considered low risk for cancer of the pelvic organs (ovaries, uterus, and vagina) and who do not have symptoms. Ask your health care provider if a screening pelvic exam is right for you. If you have had past treatment for cervical cancer or a condition that could lead to cancer, you need Pap tests and screening for cancer for at least 20 years after your treatment. If Pap tests have been discontinued, your risk factors (such as having a new sexual partner) need to be reassessed to determine if screening should be resumed. Some women have medical problems that increase the chance of getting cervical cancer. In these cases, your health care provider may recommend more frequent screening and Pap tests.  Colorectal cancer can be detected and often prevented. Most routine colorectal cancer screening begins at the age of 36 years and continues through age 22 years. However, your health care provider may recommend screening at an earlier age if you have risk factors for colon cancer. On a yearly basis, your health care provider may provide home test kits to check for hidden blood in the stool. Use of a small camera at the end of a tube, to directly examine the colon (sigmoidoscopy or colonoscopy), can detect the earliest forms of colorectal cancer. Talk to your health care provider about this at age  50, when routine screening begins.  Direct exam of the colon should be repeated every 5-10 years through age 58 years, unless early forms of pre-cancerous polyps or small growths are found. Osteoporosis is a disease in which the bones lose minerals and strength with aging. This can result in serious bone fractures or breaks. The risk of osteoporosis can be identified using a bone density scan. Women ages 25 years and over and women at risk for fractures or osteoporosis should  discuss screening with their health care providers. Ask your health care provider whether you should take a calcium supplement or vitamin D to reduce the rate of osteoporosis. Menopause can be associated with physical symptoms and risks. Hormone replacement therapy is available to decrease symptoms and risks. You should talk to your health care provider about whether hormone replacement therapy is right for you. Use sunscreen. Apply sunscreen liberally and repeatedly throughout the day. You should seek shade when your shadow is shorter than you. Protect yourself by wearing long sleeves, pants, a wide-brimmed hat, and sunglasses year round, whenever you are outdoors. Once a month, do a whole body skin exam, using a mirror to look at the skin on your back. Tell your health care provider of new moles, moles that have irregular borders, moles that are larger than a pencil eraser, or moles that have changed in shape or color. Stay current with required vaccines (immunizations). Influenza vaccine. All adults should be immunized every year. Tetanus, diphtheria, and acellular pertussis (Td, Tdap) vaccine. Pregnant women should receive 1 dose of Tdap vaccine during each pregnancy. The dose should be obtained regardless of the length of time since the last dose. Immunization is preferred during the 27th-36th week of gestation. An adult who has not previously received Tdap or who does not know her vaccine status should receive 1 dose of Tdap. This initial dose should be followed by tetanus and diphtheria toxoids (Td) booster doses every 10 years. Adults with an unknown or incomplete history of completing a 3-dose immunization series with Td-containing vaccines should begin or complete a primary immunization series including a Tdap dose. Adults should receive a Td booster every 10 years.  Zoster vaccine. One dose is recommended for adults aged 67 years or older unless certain conditions are present.  Pneumococcal  13-valent conjugate (PCV13) vaccine. When indicated, a person who is uncertain of her immunization history and has no record of immunization should receive the PCV13 vaccine. An adult aged 15 years or older who has certain medical conditions and has not been previously immunized should receive 1 dose of PCV13 vaccine. This PCV13 should be followed with a dose of pneumococcal polysaccharide (PPSV23) vaccine. The PPSV23 vaccine dose should be obtained at least 1 or more year(s) after the dose of PCV13 vaccine. An adult aged 82 years or older who has certain medical conditions and previously received 1 or more doses of PPSV23 vaccine should receive 1 dose of PCV13. The PCV13 vaccine dose should be obtained 1 or more years after the last PPSV23 vaccine dose.  Pneumococcal polysaccharide (PPSV23) vaccine. When PCV13 is also indicated, PCV13 should be obtained first. All adults aged 32 years and older should be immunized. An adult younger than age 51 years who has certain medical conditions should be immunized. Any person who resides in a nursing home or long-term care facility should be immunized. An adult smoker should be immunized. People with an immunocompromised condition and certain other conditions should receive both PCV13 and PPSV23 vaccines. People with  human immunodeficiency virus (HIV) infection should be immunized as soon as possible after diagnosis. Immunization during chemotherapy or radiation therapy should be avoided. Routine use of PPSV23 vaccine is not recommended for American Indians, [place] Natives, or people younger than 65 years unless there are medical conditions that require PPSV23 vaccine. When indicated, people who have unknown immunization and have no record of immunization should receive PPSV23 vaccine. One-time revaccination 5 years after the first dose of PPSV23 is recommended for people aged 19-64 years who have chronic kidney failure, nephrotic syndrome, asplenia, or immunocompromised  conditions. People who received 1-2 doses of PPSV23 before age 61 years should receive another dose of PPSV23 vaccine at age 85 years or later if at least 5 years have passed since the previous dose. Doses of PPSV23 are not needed for people immunized with PPSV23 at or after age 60 years.  Preventive Services / Frequency  Ages 2 years and over Blood pressure check. Lipid and cholesterol check. Lung cancer screening. / Every year if you are aged 28-80 years and have a 30-pack-year history of smoking and currently smoke or have quit within the past 15 years. Yearly screening is stopped once you have quit smoking for at least 15 years or develop a health problem that would prevent you from having lung cancer treatment. Clinical breast exam.** / Every year after age 26 years.  BRCA-related cancer risk assessment.** / For women who have family members with a BRCA-related cancer (breast, ovarian, tubal, or peritoneal cancers). Mammogram.** / Every year beginning at age 71 years and continuing for as long as you are in good health. Consult with your health care provider. Pap test.** / Every 3 years starting at age 30 years through age 62 or 80 years with 3 consecutive normal Pap tests. Testing can be stopped between 65 and 70 years with 3 consecutive normal Pap tests and no abnormal Pap or HPV tests in the past 10 years. Fecal occult blood test (FOBT) of stool. / Every year beginning at age 19 years and continuing until age 39 years. You may not need to do this test if you get a colonoscopy every 10 years. Flexible sigmoidoscopy or colonoscopy.** / Every 5 years for a flexible sigmoidoscopy or every 10 years for a colonoscopy beginning at age 68 years and continuing until age 56 years. Hepatitis C blood test.** / For all people born from [birth year] through 1965 and any individual with known risks for hepatitis C. Osteoporosis screening.** / A one-time screening for women ages 62 years and over and women at risk  for fractures or osteoporosis. Skin self-exam. / Monthly. Influenza vaccine. / Every year. Tetanus, diphtheria, and acellular pertussis (Tdap/Td) vaccine.** / 1 dose of Td every 10 years. Zoster vaccine.** / 1 dose for adults aged 27 years or older. Pneumococcal 13-valent conjugate (PCV13) vaccine.** / Consult your health care provider. Pneumococcal polysaccharide (PPSV23) vaccine.** / 1 dose for all adults aged 29 years and older. Screening for abdominal aortic aneurysm (AAA)  by ultrasound is recommended for people who have history of high blood pressure or who are current or former smokers. ++++++++++++++++++++ Recommend Adult Low Dose Aspirin or  coated  Aspirin 81 mg daily  To reduce risk of Colon Cancer 40 %,  Skin Cancer 26 % ,  Melanoma 46%  and  Pancreatic cancer 60% ++++++++++++++++++++ Vitamin D goal  is between 70-100.  Please make sure that you are taking your Vitamin D as directed.  It is very important as a natural  anti-inflammatory  helping hair, skin, and nails, as well as reducing stroke and heart attack risk.  It helps your bones and helps with mood. It also decreases numerous cancer risks so please take it as directed.  Low Vit D is associated with a 200-300% higher risk for CANCER  and 200-300% higher risk for HEART   ATTACK  &  STROKE.   .....................................Marland Kitchen It is also associated with higher death rate at younger ages,  autoimmune diseases like Rheumatoid arthritis, Lupus, Multiple Sclerosis.    Also many other serious conditions, like depression, Alzheimer's Dementia, infertility, muscle aches, fatigue, fibromyalgia - just to name a few. ++++++++++++++++++ Recommend the book "The END of DIETING" by Dr Excell Seltzer  & the book "The END of DIABETES " by Dr Excell Seltzer At [email or web address] - get book & Audio CD's    Being diabetic has a  300% increased risk for heart attack, stroke, cancer, and alzheimer- type vascular dementia. It is very important  that you work harder with diet by avoiding all foods that are white. Avoid white rice (brown & wild rice is OK), white potatoes (sweetpotatoes in moderation is OK), White bread or wheat bread or anything made out of white flour like bagels, donuts, rolls, buns, biscuits, cakes, pastries, cookies, pizza crust, and pasta (made from white flour & egg whites) - vegetarian pasta or spinach or wheat pasta is OK. Multigrain breads like Arnold's or Pepper[employer], or multigrain sandwich thins or flatbreads.  Diet, exercise and weight loss can reverse and cure diabetes in the early stages.  Diet, exercise and weight loss is very important in the control and prevention of complications of diabetes which affects every system in your body, ie. Brain - dementia/stroke, eyes - glaucoma/blindness, heart - heart attack/heart failure, kidneys - dialysis, stomach - gastric paralysis, intestines - malabsorption, nerves - severe painful neuritis, circulation - gangrene & loss of a leg(s), and finally cancer and Alzheimers.    I recommend avoid fried & greasy foods,  sweets/candy, white rice (brown or wild rice or Quinoa is OK), white potatoes (sweet potatoes are OK) - anything made from white flour - bagels, doughnuts, rolls, buns, biscuits,white and wheat breads, pizza crust and traditional pasta made of white flour & egg white(vegetarian pasta or spinach or wheat pasta is OK).  Multi-grain bread is OK - like multi-grain flat bread or sandwich thins. Avoid alcohol in excess. Exercise is also important.    Eat all the vegetables you want - avoid meat, especially red meat and dairy - especially cheese.  Cheese is the most concentrated form of trans-fats which is the worst thing to clog up our arteries. Veggie cheese is OK which can be found in the fresh produce section at [employer] or [employer] or [employer]  +++++++++++++++++++ DASH Eating Plan  DASH stands for "Dietary Approaches to Stop Hypertension."   The DASH  eating plan is a healthy eating plan that has been shown to reduce high blood pressure (hypertension). Additional health benefits may include reducing the risk of type 2 diabetes mellitus, heart disease, and stroke. The DASH eating plan may also help with weight loss. WHAT DO I NEED TO KNOW ABOUT THE DASH EATING PLAN? For the DASH eating plan, you will follow these general guidelines: Choose foods with a percent daily value for sodium of less than 5% (as listed on the food label). Use salt-free seasonings or herbs instead of table salt or sea salt. Check with your health care provider or pharmacist  before using salt substitutes. Eat lower-sodium products, often labeled as "lower sodium" or "no salt added." Eat fresh foods. Eat more vegetables, fruits, and low-fat dairy products. Choose whole grains. Look for the word "whole" as the first word in the ingredient list. Choose fish  Limit sweets, desserts, sugars, and sugary drinks. Choose heart-healthy fats. Eat veggie cheese  Eat more home-cooked food and less restaurant, buffet, and fast food. Limit fried foods. Cook foods using methods other than frying. Limit canned vegetables. If you do use them, rinse them well to decrease the sodium. When eating at a restaurant, ask that your food be prepared with less salt, or no salt if possible.                      WHAT FOODS CAN I EAT? Read Dr Fara Olden Fuhrman's books on The End of Dieting & The End of Diabetes  Grains Whole grain or whole wheat bread. Brown rice. Whole grain or whole wheat pasta. Quinoa, bulgur, and whole grain cereals. Low-sodium cereals. Corn or whole wheat flour tortillas. Whole grain cornbread. Whole grain crackers. Low-sodium crackers.  Vegetables Fresh or frozen vegetables (raw, steamed, roasted, or grilled). Low-sodium or reduced-sodium tomato and vegetable juices. Low-sodium or reduced-sodium tomato sauce and paste. Low-sodium or reduced-sodium canned vegetables.    Fruits All fresh, canned (in natural juice), or frozen fruits.  Protein Products  All fish and seafood.  Dried beans, peas, or lentils. Unsalted nuts and seeds. Unsalted canned beans.  Dairy Low-fat dairy products, such as skim or 1% milk, 2% or reduced-fat cheeses, low-fat ricotta or cottage cheese, or plain low-fat yogurt. Low-sodium or reduced-sodium cheeses.  Fats and Oils Tub margarines without trans fats. Light or reduced-fat mayonnaise and salad dressings (reduced sodium). Avocado. Safflower, olive, or canola oils. Natural peanut or almond butter.  Other Unsalted popcorn and pretzels. The items listed above may not be a complete list of recommended foods or beverages. Contact your dietitian for more options.  +++++++++++++++  WHAT FOODS ARE NOT RECOMMENDED? Grains/ White flour or wheat flour White bread. White pasta. White rice. Refined cornbread. Bagels and croissants. Crackers that contain trans fat.  Vegetables  Creamed or fried vegetables. Vegetables in a . Regular canned vegetables. Regular canned tomato sauce and paste. Regular tomato and vegetable juices.  Fruits Dried fruits. Canned fruit in light or heavy syrup. Fruit juice.  Meat and Other Protein Products Meat in general - RED meat & White meat.  Fatty cuts of meat. Ribs, chicken wings, all processed meats as bacon, sausage, bologna, salami, fatback, hot dogs, bratwurst and packaged luncheon meats.  Dairy Whole or 2% milk, cream, half-and-half, and cream cheese. Whole-fat or sweetened yogurt. Full-fat cheeses or blue cheese. Non-dairy creamers and whipped toppings. Processed cheese, cheese spreads, or cheese curds.  Condiments Onion and garlic salt, seasoned salt, table salt, and sea salt. Canned and packaged gravies. Worcestershire sauce. Tartar sauce. Barbecue sauce. Teriyaki sauce. Soy sauce, including reduced sodium. Steak sauce. Fish sauce. Oyster sauce. Cocktail sauce. Horseradish. Ketchup and mustard.  Meat flavorings and tenderizers. Bouillon cubes. Hot sauce. Tabasco sauce. Marinades. Taco seasonings. Relishes.  Fats and Oils Butter, stick margarine, lard, shortening and bacon fat. Coconut, palm kernel, or palm oils. Regular salad dressings.  Pickles and olives. Salted popcorn and pretzels.  The items listed above may not be a complete list of foods and beverages to avoid.

## 2021-12-20 NOTE — Progress Notes (Signed)
Annual Screening/Preventative Visit & Comprehensive Evaluation &  Examination  Future Appointments  Date Time Provider Department  12/20/2021              CPE  3:00 PM Unk Pinto, MD [facility]  09/05/2022          Wellness 11:00 AM Liane Comber, NP [facility]  12/26/2022            CPE  3:00 PM Unk Pinto, MD [facility]        This very nice 75 y.o.  [facility] presents for a Screening /Preventative Visit & comprehensive evaluation and management of multiple medical co-morbidities.  Patient has been followed for HTN, HLD, Prediabetes  and Vitamin D Deficiency. Patient has hx/o LPRD & cough  controlled on Nexium.          HTN predates since  2012.  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 In 2019 , she had a negative Myoview with EF 74%. Patient's BP has been controlled at home and patient denies any cardiac symptoms as chest pain, palpitations, shortness of breath, dizziness or ankle swelling.  Patient has CKD2  (GFR 78).  Today's BP is at goal - 118/64.        Patient's hyperlipidemia is controlled with diet and Rosuvastatin.  Patient denies myalgias or other medication SE's. Last lipids were at goal :  Lab Results  Component Value Date   CHOL 157 06/16/2021   HDL 85 06/16/2021   LDLCALC 58 06/16/2021   TRIG 61 06/16/2021   CHOLHDL 1.8 06/16/2021         Patient has been followed /monitored expectantly for glucose intolerance and patient denies reactive hypoglycemic symptoms, visual blurring, diabetic polys or paresthesias. Last A1c was normal & at goal :  Lab Results  Component Value Date   HGBA1C 5.3 06/16/2021         Finally, patient has history of Vitamin D Deficiency ("9" /2009) and last Vitamin D was at goal :  Lab Results  Component Value Date   VD25OH 88 06/16/2021     Current Outpatient Medications on File Prior to Visit  Medication Sig   VITAMIN C 1,000 mg Take daily.    aspirin 81 MG tablet Take  daily.    atenolol 50 MG tablet TAKE ONE TABLET  EVERY DAY    cetirizine 10 MG tablet Take 1 daily.   VITAMIN D 2000 UNITS  Take 6,000 Units daily.    esomeprazole 40 MG capsule Take  1 capsule  Daily    MAGNESIUM OXIDE 400 mg  Take  daily.   meclizine  25 MG tablet TAKE 1/2-1 TAB  2-3 TIMES DAILY AS NEEDED    Melatonin 5 MG TABS Patient takes 3 tablets to equal 15 mg at bedtime.   Multiple Vitamin  Take 1 tablet  daily.   multivitamin-lutein  Take 1 capsule daily.   Omega-3 FISH OIL 1000 MG CAPS Take 1 capsule daily.   Vitamin B Complex Takes  1 daily   rosuvastatin 10 MG tablet TAKE ONE TABLET  DAILY   traZODone 50 MG tablet take 1/2-2 tablets at bedtime as needed for sleep   TURMERIC 1,500 mg Take  daily   Zinc 50 MG TABS Take daily.     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   GERD 10/20/2013   Hyperlipidemia 10/20/2013   Migraine 10/20/2013   Morbid obesity ([place]) 11/18/2014   Prediabetes 10/20/2013   SVT (supraventricular tachycardia) ([place]) 11/15/2015   Vitamin D deficiency 10/20/2013     Health Maintenance  Topic Date Due   COVID-19 Vaccine (1) Never done   Zoster Vaccines- Shingrix (1 of 2) Never done   Pneumonia Vaccine 53+ Years old (3) 10/05/2015   INFLUENZA VACCINE  06/12/2021   Fecal DNA (Cologuard)  08/16/2022   TETANUS/TDAP  09/13/2023   DEXA SCAN  Completed   Hepatitis C Screening  Completed   HPV VACCINES  Aged Out   COLONOSCOPY Discontinued    Immunization History  Administered Date(s) Administered   DTaP 09/12/2013   Influenza Split 09/12/2013   Influenza, High Dose  10/04/2014, 08/30/2017   Pneumococcal -13 10/04/2014   Pneumococcal -23 03/29/2009   Td 12/20/2003   Tdap 09/12/2013   Zoster, Live 06/07/2010    Last Colon - 06/13/2007 - Dr  Deatra Ina                      -  In 2019 patient was referred  to [facility] Patient Refused - 08/17/2019 - Cologard - Negative & advised 3 yr f/u.   Last MGM -  09/26/2021   Past Surgical History:  Procedure Laterality Date   NO PAST SURGERIES       Family History  Problem Relation Age of Onset   Heart disease Mother    Breast cancer Mother    CAD Mother        stents emergently in her 67s   Lung cancer Father        smoker, mets to brain   Stroke Maternal Grandmother    Polycystic kidney disease Paternal Uncle    Kidney failure Paternal Uncle    Heart attack Sister 86       LAD   Anuerysm Sister      Social History   Tobacco Use   Smoking status: Never   Smokeless tobacco: Never  Substance Use Topics   Alcohol use: No      ROS Constitutional: Denies fever, chills, weight loss/gain, headaches, insomnia,  night sweats, and change in appetite. Does c/o fatigue. Eyes: Denies redness, blurred vision, diplopia, discharge, itchy, watery eyes.  ENT: Denies discharge, congestion, post nasal drip, epistaxis, sore throat, earache, hearing loss, dental pain, Tinnitus, Vertigo, Sinus pain, snoring.  Cardio: Denies chest pain, palpitations, irregular heartbeat, syncope, dyspnea, diaphoresis, orthopnea, PND, claudication, edema Respiratory: denies cough, dyspnea, DOE, pleurisy, hoarseness, laryngitis, wheezing.  Gastrointestinal: Denies dysphagia, heartburn, reflux, water brash, pain, cramps, nausea, vomiting, bloating, diarrhea, constipation, hematemesis, melena, hematochezia, jaundice, hemorrhoids Genitourinary: Denies dysuria, frequency, urgency, nocturia, hesitancy, discharge, hematuria, flank pain Breast: Breast lumps, nipple discharge, bleeding.  Musculoskeletal: Denies arthralgia, myalgia, stiffness, Jt. Swelling, pain, limp, and strain/sprain. Denies falls. Skin: Denies puritis, rash, hives, warts, acne, eczema, changing in skin lesion Neuro: No weakness, tremor,  incoordination, spasms, paresthesia, pain Psychiatric: Denies confusion, memory loss, sensory loss. Denies Depression. Endocrine: Denies change in weight, skin, hair change, nocturia, and paresthesia, diabetic polys, visual blurring, hyper / hypo glycemic episodes.  Heme/Lymph: No excessive bleeding, bruising, enlarged lymph nodes.  Physical Exam  BP 118/64    Pulse 72    Temp 97.7 F (36.5 C)    Ht 5' 0.5" (1.537 m)  Wt 146 lb 12.8 oz (66.6 kg)    SpO2 98%    BMI 28.20 kg/m   General Appearance: Well nourished, well groomed and in no apparent distress.  Eyes: PERRLA, EOMs, conjunctiva no swelling or erythema, normal fundi and vessels. Sinuses: No frontal/maxillary tenderness ENT/Mouth: EACs patent / TMs  nl. Nares clear without erythema, swelling, mucoid exudates. Oral hygiene is good. No erythema, swelling, or exudate. Tongue normal, non-obstructing. Tonsils not swollen or erythematous. Hearing normal.  Neck: Supple, thyroid not palpable. No bruits, nodes or JVD. Respiratory: Respiratory effort normal.  BS equal and clear bilateral without rales, rhonci, wheezing or stridor. Cardio: Heart sounds are normal with regular rate and rhythm and no murmurs, rubs or gallops. Peripheral pulses are normal and equal bilaterally without edema. No aortic or femoral bruits. Chest: symmetric with normal excursions and percussion. Breasts: Symmetric, without lumps, nipple discharge, retractions, or fibrocystic changes.  Abdomen: Flat, soft with bowel sounds active. Nontender, no guarding, rebound, hernias, masses, or organomegaly.  Lymphatics: Non tender without lymphadenopathy.  Genitourinary:  Musculoskeletal: Full ROM all peripheral extremities, joint stability, 5/5 strength, and normal gait. Skin: Warm and dry without rashes, lesions, cyanosis, clubbing or  ecchymosis.  Neuro: Cranial nerves intact, reflexes equal bilaterally. Normal muscle tone, no cerebellar symptoms. Sensation intact.  Pysch:  Alert and oriented X 3, normal affect, Insight and Judgment appropriate.    Assessment and Plan  1. Annual Preventative Screening Examination   2. Essential hypertension  - EKG 12-Lead - Urinalysis, Routine w reflex microscopic - Microalbumin / creatinine urine ratio - CBC with Differential/Platelet - COMPLETE METABOLIC PANEL WITH GFR - Magnesium - TSH  3. Hyperlipidemia, mixed  - EKG 12-Lead - Lipid panel - TSH  4. Abnormal glucose  - EKG 12-Lead - Hemoglobin A1c - Insulin, random  5. Vitamin D deficiency  - VITAMIN D 25 Hydroxy  6. CKD (chronic kidney disease) stage 2, GFR 60-89 ml/min  - Urinalysis, Routine w reflex microscopic - Microalbumin / creatinine urine ratio - COMPLETE METABOLIC PANEL WITH GFR  7. LPRD (laryngopharyngeal reflux disease)  - CBC with Differential/Platelet  8. Screening for colorectal cancer  - Cologuard  9. Screening for ischemic heart disease  - EKG 12-Lead  10. FHx: heart disease  - EKG 12-Lead  11. Medication management  - Urinalysis, Routine w reflex microscopic - Microalbumin / creatinine urine ratio - CBC with Differential/Platelet - COMPLETE METABOLIC PANEL WITH GFR - Magnesium - Lipid panel - TSH - Hemoglobin A1c - Insulin, random - VITAMIN D 25 Hydroxy - Cologuard         Patient was counseled in prudent diet to achieve/maintain BMI less than 25 for weight control, BP monitoring, regular exercise and medications. Discussed med's effects and SE's. Screening labs and tests as requested with regular follow-up as recommended. Over 40 minutes of exam, counseling, chart review and high complex critical decision making was performed.   Kirtland Bouchard, MD

## 2021-12-20 NOTE — Progress Notes (Signed)
Aorta Scan < 3 cm 

## 2021-12-21 LAB — INSULIN, RANDOM: Insulin: 8.7 u[IU]/mL

## 2021-12-21 LAB — CBC WITH DIFFERENTIAL/PLATELET
Absolute Monocytes: 908 cells/uL (ref 200–950)
Basophils Absolute: 41 cells/uL (ref 0–200)
Basophils Relative: 0.4 %
Eosinophils Absolute: 153 cells/uL (ref 15–500)
Eosinophils Relative: 1.5 %
HCT: 41.8 % (ref 35.0–45.0)
Hemoglobin: 13.8 g/dL (ref 11.7–15.5)
Lymphs Abs: 836 cells/uL — ABNORMAL LOW (ref 850–3900)
MCH: 29.3 pg (ref 27.0–33.0)
MCHC: 33 g/dL (ref 32.0–36.0)
MCV: 88.7 fL (ref 80.0–100.0)
MPV: 9.3 fL (ref 7.5–12.5)
Monocytes Relative: 8.9 %
Neutro Abs: 8262 cells/uL — ABNORMAL HIGH (ref 1500–7800)
Neutrophils Relative %: 81 %
Platelets: 264 10*3/uL (ref 140–400)
RBC: 4.71 10*6/uL (ref 3.80–5.10)
RDW: 11.9 % (ref 11.0–15.0)
Total Lymphocyte: 8.2 %
WBC: 10.2 10*3/uL (ref 3.8–10.8)

## 2021-12-21 LAB — URINALYSIS, ROUTINE W REFLEX MICROSCOPIC
Bilirubin Urine: NEGATIVE
Glucose, UA: NEGATIVE
Hgb urine dipstick: NEGATIVE
Ketones, ur: NEGATIVE
Leukocytes,Ua: NEGATIVE
Nitrite: NEGATIVE
Protein, ur: NEGATIVE
Specific Gravity, Urine: 1.003 (ref 1.001–1.035)
pH: 6 (ref 5.0–8.0)

## 2021-12-21 LAB — COMPLETE METABOLIC PANEL WITH GFR
AG Ratio: 1.8 (calc) (ref 1.0–2.5)
ALT: 16 U/L (ref 6–29)
AST: 22 U/L (ref 10–35)
Albumin: 4.1 g/dL (ref 3.6–5.1)
Alkaline phosphatase (APISO): 75 U/L (ref 37–153)
BUN: 13 mg/dL (ref 7–25)
CO2: 27 mmol/L (ref 20–32)
Calcium: 9.6 mg/dL (ref 8.6–10.4)
Chloride: 101 mmol/L (ref 98–110)
Creat: 0.87 mg/dL (ref 0.60–1.00)
Globulin: 2.3 g/dL (calc) (ref 1.9–3.7)
Glucose, Bld: 93 mg/dL (ref 65–99)
Potassium: 4 mmol/L (ref 3.5–5.3)
Sodium: 138 mmol/L (ref 135–146)
Total Bilirubin: 0.6 mg/dL (ref 0.2–1.2)
Total Protein: 6.4 g/dL (ref 6.1–8.1)
eGFR: 69 mL/min/{1.73_m2} (ref >=60)

## 2021-12-21 LAB — TSH: TSH: 0.57 mIU/L (ref 0.40–4.50)

## 2021-12-21 LAB — MICROALBUMIN / CREATININE URINE RATIO
Creatinine, Urine: 16 mg/dL — ABNORMAL LOW (ref 20–275)
Microalb, Ur: <0.2 mg/dL

## 2021-12-21 LAB — LIPID PANEL
Cholesterol: 129 mg/dL (ref <200)
HDL: 72 mg/dL (ref >=50)
LDL Cholesterol (Calc): 43 mg/dL (calc)
Non-HDL Cholesterol (Calc): 57 mg/dL (calc) (ref <130)
Total CHOL/HDL Ratio: 1.8 (calc) (ref <5.0)
Triglycerides: 64 mg/dL (ref <150)

## 2021-12-21 LAB — HEMOGLOBIN A1C
Hgb A1c MFr Bld: 5.5 % of total Hgb (ref <5.7)
Mean Plasma Glucose: 111 mg/dL
eAG (mmol/L): 6.2 mmol/L

## 2021-12-21 LAB — MAGNESIUM: Magnesium: 1.9 mg/dL (ref 1.5–2.5)

## 2021-12-21 LAB — VITAMIN D 25 HYDROXY (VIT D DEFICIENCY, FRACTURES): Vit D, 25-Hydroxy: 73 ng/mL (ref 30–100)

## 2021-12-21 NOTE — Progress Notes (Signed)
=============================================================== °-   Test results slightly outside the reference range are not unusual. If there is anything important, I will review this with you,  otherwise it is considered normal test values.  If you have further questions,  please do not hesitate to contact me at the office or via My Chart.  =============================================================== ===============================================================  -  Total Chol = 129   &   LDL  Chol = 43             -           Both     Excellent   - Very low risk for Heart Attack  / Stroke =============================================================== ===============================================================  -  A1c - Normal - No Diabetes - Great  ! =============================================================== ===============================================================  -  Vitamin D = 73   -    Excellent -  Please keep dose same  =============================================================== ===============================================================  -  All Else - CBC - Kidneys - Electrolytes - Liver - Magnesium & Thyroid    - all  Normal / OK =============================================================== ===============================================================

## 2021-12-27 ENCOUNTER — Other Ambulatory Visit: Payer: Self-pay | Admitting: Adult Health

## 2021-12-27 DIAGNOSIS — E782 Mixed hyperlipidemia: Secondary | ICD-10-CM

## 2022-03-07 ENCOUNTER — Other Ambulatory Visit: Payer: Self-pay | Admitting: Internal Medicine

## 2022-03-07 DIAGNOSIS — K219 Gastro-esophageal reflux disease without esophagitis: Secondary | ICD-10-CM

## 2022-03-30 DIAGNOSIS — H25813 Combined forms of age-related cataract, bilateral: Secondary | ICD-10-CM | POA: Diagnosis not present

## 2022-03-30 DIAGNOSIS — H16223 Keratoconjunctivitis sicca, not specified as Sjogren's, bilateral: Secondary | ICD-10-CM | POA: Diagnosis not present

## 2022-04-11 ENCOUNTER — Encounter: Payer: Self-pay | Admitting: Adult Health

## 2022-04-11 ENCOUNTER — Ambulatory Visit (INDEPENDENT_AMBULATORY_CARE_PROVIDER_SITE_OTHER): Payer: Medicare Other | Admitting: Adult Health

## 2022-04-11 VITALS — BP 122/64 | HR 60 | Temp 97.7°F | Wt 148.0 lb

## 2022-04-11 DIAGNOSIS — R7309 Other abnormal glucose: Secondary | ICD-10-CM | POA: Diagnosis not present

## 2022-04-11 DIAGNOSIS — Z79899 Other long term (current) drug therapy: Secondary | ICD-10-CM | POA: Diagnosis not present

## 2022-04-11 DIAGNOSIS — N182 Chronic kidney disease, stage 2 (mild): Secondary | ICD-10-CM

## 2022-04-11 DIAGNOSIS — I471 Supraventricular tachycardia: Secondary | ICD-10-CM | POA: Diagnosis not present

## 2022-04-11 DIAGNOSIS — I10 Essential (primary) hypertension: Secondary | ICD-10-CM | POA: Diagnosis not present

## 2022-04-11 DIAGNOSIS — E782 Mixed hyperlipidemia: Secondary | ICD-10-CM | POA: Diagnosis not present

## 2022-04-11 DIAGNOSIS — F5101 Primary insomnia: Secondary | ICD-10-CM

## 2022-04-11 NOTE — Progress Notes (Signed)
3 MONTH FOLLOW UP  Assessment:    Essential hypertension - continue medications, DASH diet, exercise and monitor at home. Call if greater than 130/80.  -     CBC with Differential/Platelet -     CMP/GFR -     TSH  SVT (supraventricular tachycardia) ([place]) Had ED visits x 2, saw cardiology Controlled with atenolol  No symptoms at this time Continue cardio follow up as recommended  GERD Continue PPI, persistent cough improved, take PPI at night Stop night time snacking, avoid eating for 4 hours Diet/lifestyle discussed; raise HOB on blocks, avoid large/fatty meals, avoid reclining 4 hours after meals  Hyperlipidemia -continue medications, check lipids q63m decrease fatty foods, increase activity.   Other abnormal glucose Recent A1Cs at goal Discussed diet/exercise, weight management  Defer A1C; check CMP  Vitamin D deficiency At goal at recent check; continue to recommend supplementation for goal of 70-100 Defer vitamin D level  Overweight Lifestyle discussed; increase exercise  Insomnia/anxiety Trazodone 75 mg and melatonin 5 mg working well; Stress management techniques discussed, increase water, good sleep hygiene discussed, increase exercise, and increase veggies.   Skin tags/nevi - schedule OV for removal, frequently irritated per patient   Orders Placed This Encounter  Procedures   CBC with Differential/Platelet   Magnesium   COMPLETE METABOLIC PANEL WITH GFR   Lipid panel   TSH     Over 30 minutes of exam, counseling, chart review, and critical decision making was performed  Future Appointments  Date Time Provider [place] 07/12/2022  2:30 PM MUnk Pinto MD [facility] None  10/15/2022 10:30 AM MMagda Bernheim NP [facility] None  01/16/2023 10:00 AM MUnk Pinto MD [facility] None     Subjective:   Jean GILKERSONis a 76y.o. female who presents for 3 month follow up on hypertension, prediabetes, hyperlipidemia, vitamin D def.    Husband passed away in 2Mar 23, 2020 has occasional down moments (never full days) but does regular devotions and gratefulness/awareness and managing very well. She is prescribed trazodone 75 mg for mood/insomnia, also uses 5 mg melatonin, tapered off of xanax. Back to church has been helpful.   She had persistent cough, was having breakthrough with omzeprazole OTC, now on nexium 40 mg with perceived benefit. She alternates allegra/zyrtec for allergies.   Today she mentions neck skin tags, nevi to chest and back that gets irritated by bra, would like these removed.   BMI is Body mass index is 28.43 kg/m., she has been working on diet and exercise, does walk daily 15-20 min, also very active in yard, mowing several acres. Admits snacking at night Wt Readings from Last 3 Encounters:  04/11/22 148 lb (67.1 kg)  12/20/21 146 lb 12.8 oz (66.6 kg)  09/05/21 143 lb (64.9 kg)   She has hx of episodes of SVT for which she presented to ED x2; improved with addition of atenolol. She has family hx of CAD, underwent myoview 09/2018 by Dr. NJohnsie Cancelwhich was normal.   Her blood pressure has been controlled at home, today their BP is BP: 122/64 She does workout. She denies chest pain, shortness of breath, dizziness.   She is on cholesterol medication (rosuvastatin 10 mg daily). Her cholesterol is not at goal. The cholesterol last visit was:   Lab Results  Component Value Date   CHOL 129 12/20/2021   HDL 72 12/20/2021   LDLCALC 43 12/20/2021   TRIG 64 12/20/2021   CHOLHDL 1.8 12/20/2021    Last A1C in  the office was:  Lab Results  Component Value Date   HGBA1C 5.5 12/20/2021   CKD II monitored here. Last GFR Lab Results  Component Value Date   EGFR 69 12/20/2021   EGFR 78 09/05/2021   EGFR 70 06/16/2021   Patient is on Vitamin D supplement. Lab Results  Component Value Date   VD25OH 73 12/20/2021      Medication Review Current Outpatient Medications on File Prior to Visit  Medication Sig  Dispense Refill   Ascorbic Acid (VITAMIN C PO) Take 1,000 mg by mouth daily.      aspirin 81 MG tablet Take 81 mg by mouth daily.     atenolol (TENORMIN) 50 MG tablet TAKE ONE TABLET BY MOUTH EVERY DAY FOR BLOOD PRESSURE 90 tablet 3   benzonatate (TESSALON) 200 MG capsule Take  1 perle  3 x /day for  Cough 90 capsule 3   cetirizine (ZYRTEC) 10 MG tablet Take 10 mg by mouth daily.     Cholecalciferol (VITAMIN D) 2000 UNITS tablet Take 6,000 Units by mouth daily.      esomeprazole (NEXIUM) 40 MG capsule TAKE ONE CAPSULE BY MOUTH DAILY TO prevent acid reflux & hoarseness 90 capsule 3   MAGNESIUM OXIDE PO Take 400 mg by mouth daily.     meclizine (ANTIVERT) 25 MG tablet TAKE 1/2-1 TABLET BY MOUTH 2-3 TIMES DAILY AS NEEDED FOR dizziness/vertigo 270 tablet 3   Melatonin 5 MG TABS Take by mouth. Patient takes 3 tablets to equal 15 mg at bedtime.     Multiple Vitamin (MULTIVITAMIN) tablet Take 1 tablet by mouth daily.     multivitamin-lutein (OCUVITE-LUTEIN) CAPS capsule Take 1 capsule by mouth daily.     Omega-3 Fatty Acids (FISH OIL) 1000 MG CAPS Take 1 capsule by mouth daily.     OVER THE COUNTER MEDICATION Takes Vitamin B Complex 1 daily     rosuvastatin (CRESTOR) 10 MG tablet TAKE ONE TABLET BY MOUTH DAILY FOR cholesterol 90 tablet 3   traZODone (DESYREL) 50 MG tablet take 1/2-2 tablets at bedtime as needed for sleep 180 tablet 3   TURMERIC PO Take 1,500 mg by mouth.     Zinc 50 MG TABS Take 50 mg by mouth daily.     promethazine-dextromethorphan (PROMETHAZINE-DM) 6.25-15 MG/5ML syrup Take 1 tsp every 4 hours if needed for cough (Patient not taking: Reported on 12/20/2021) 240 mL 1   No current facility-administered medications on file prior to visit.    Current Problems (verified) Patient Active Problem List   Diagnosis Date Noted   COVID-19 (09/13/2021) 09/14/2021   Vertigo 09/05/2021   Cough present for greater than 3 weeks 03/20/2021   CKD (chronic kidney disease) stage 2, GFR 60-89  ml/min 12/11/2020   Osteopenia 09/05/2018   FHx: heart disease 06/18/2018   Insomnia 12/06/2017   SVT (supraventricular tachycardia) ([place]) 11/15/2015   Overweight (BMI 25.0-29.9) 11/18/2014   Medication management 04/20/2014   Essential hypertension 10/20/2013   Hyperlipidemia, mixed 10/20/2013   Abnormal glucose 10/20/2013   Vitamin D deficiency 10/20/2013   GERD 10/20/2013    Allergies Allergies  Allergen Reactions   Penicillins Anaphylaxis   Codeine Nausea And Vomiting   Sudafed [Pseudoephedrine Hcl]     Makes heart race, per pt     SURGICAL HISTORY She  has a past surgical history that includes No past surgeries. FAMILY HISTORY Her family history includes Anuerysm in her sister; Breast cancer in her mother; CAD in her mother; Heart attack (age  of onset: 61) in her sister; Heart disease in her mother; Kidney failure in her paternal uncle; Lung cancer in her father; Polycystic kidney disease in her paternal uncle; Stroke in her maternal grandmother. SOCIAL HISTORY She  reports that she has never smoked. She has never used smokeless tobacco. She reports that she does not drink alcohol.   Review of Systems  Constitutional:  Negative for malaise/fatigue and weight loss.  HENT:  Negative for hearing loss and tinnitus.   Eyes:  Negative for blurred vision and double vision.  Respiratory:  Positive for cough (improved with PPI/H2i). Negative for shortness of breath and wheezing.   Cardiovascular:  Negative for chest pain, palpitations, orthopnea, claudication and leg swelling.  Gastrointestinal:  Negative for abdominal pain, blood in stool, constipation, diarrhea, heartburn, melena, nausea and vomiting.  Genitourinary: Negative.   Musculoskeletal:  Negative for joint pain and myalgias.  Skin:  Negative for rash.  Neurological:  Negative for dizziness, tingling, sensory change, weakness and headaches.  Endo/Heme/Allergies:  Positive for environmental allergies. Negative for  polydipsia.  Psychiatric/Behavioral: Negative.    All other systems reviewed and are negative.  Objective:   Today's Vitals   04/11/22 1417  BP: 122/64  Pulse: 60  Temp: 97.7 F (36.5 C)  SpO2: 99%  Weight: 148 lb (67.1 kg)     Body mass index is 28.43 kg/m.  General appearance: alert, no distress, WD/WN,  female HEENT: normocephalic, sclerae anicteric, TMs pearly, nares patent, no discharge or erythema, pharynx normal Oral cavity: MMM, no lesions Neck: supple, no lymphadenopathy, no thyromegaly, no masses Heart: RRR, normal S1, S2, no murmurs Lungs: CTA bilaterally, no wheezes, rhonchi, or rales Abdomen: +bs, soft, non tender, + distended/full lower AB, no masses, no hepatomegaly, no splenomegaly Musculoskeletal: nontender, no swelling, no obvious deformity Extremities: no edema, no cyanosis, no clubbing Pulses: 2+ symmetric, upper and lower extremities, normal cap refill Neurological: alert, oriented x 3, CN2-12 intact, strength normal upper extremities and lower extremities, sensation normal throughout, DTRs 2+ throughout, no cerebellar signs, gait normal Psychiatric: normal affect, behavior normal, pleasant  Skin: warm/dry, intact, no rash; skin tags to neck, raised nevi with even color/borders to check and mid back/bra line   Izora Ribas, NP   04/11/2022

## 2022-04-12 LAB — TSH: TSH: 0.82 m[IU]/L (ref 0.40–4.50)

## 2022-04-12 LAB — CBC WITH DIFFERENTIAL/PLATELET
Absolute Monocytes: 576 {cells}/uL (ref 200–950)
Basophils Absolute: 48 {cells}/uL (ref 0–200)
Basophils Relative: 0.8 %
Eosinophils Absolute: 108 {cells}/uL (ref 15–500)
Eosinophils Relative: 1.8 %
HCT: 42.6 % (ref 35.0–45.0)
Hemoglobin: 14.1 g/dL (ref 11.7–15.5)
Lymphs Abs: 804 {cells}/uL — ABNORMAL LOW (ref 850–3900)
MCH: 29.7 pg (ref 27.0–33.0)
MCHC: 33.1 g/dL (ref 32.0–36.0)
MCV: 89.7 fL (ref 80.0–100.0)
MPV: 9.4 fL (ref 7.5–12.5)
Monocytes Relative: 9.6 %
Neutro Abs: 4464 {cells}/uL (ref 1500–7800)
Neutrophils Relative %: 74.4 %
Platelets: 283 Thousand/uL (ref 140–400)
RBC: 4.75 Million/uL (ref 3.80–5.10)
RDW: 12.2 % (ref 11.0–15.0)
Total Lymphocyte: 13.4 %
WBC: 6 Thousand/uL (ref 3.8–10.8)

## 2022-04-12 LAB — COMPLETE METABOLIC PANEL WITHOUT GFR
AG Ratio: 1.6 (calc) (ref 1.0–2.5)
ALT: 18 U/L (ref 6–29)
AST: 26 U/L (ref 10–35)
Albumin: 4.2 g/dL (ref 3.6–5.1)
Alkaline phosphatase (APISO): 93 U/L (ref 37–153)
BUN: 18 mg/dL (ref 7–25)
CO2: 25 mmol/L (ref 20–32)
Calcium: 9.4 mg/dL (ref 8.6–10.4)
Chloride: 100 mmol/L (ref 98–110)
Creat: 0.88 mg/dL (ref 0.60–1.00)
Globulin: 2.6 g/dL (ref 1.9–3.7)
Glucose, Bld: 94 mg/dL (ref 65–99)
Potassium: 4.3 mmol/L (ref 3.5–5.3)
Sodium: 137 mmol/L (ref 135–146)
Total Bilirubin: 0.5 mg/dL (ref 0.2–1.2)
Total Protein: 6.8 g/dL (ref 6.1–8.1)
eGFR: 68 mL/min/1.73m2

## 2022-04-12 LAB — LIPID PANEL
Cholesterol: 150 mg/dL
HDL: 76 mg/dL
LDL Cholesterol (Calc): 56 mg/dL
Non-HDL Cholesterol (Calc): 74 mg/dL
Total CHOL/HDL Ratio: 2 (calc)
Triglycerides: 98 mg/dL

## 2022-04-12 LAB — MAGNESIUM: Magnesium: 1.9 mg/dL (ref 1.5–2.5)

## 2022-04-24 ENCOUNTER — Ambulatory Visit (INDEPENDENT_AMBULATORY_CARE_PROVIDER_SITE_OTHER): Payer: Medicare Other | Admitting: Internal Medicine

## 2022-04-24 ENCOUNTER — Encounter: Payer: Self-pay | Admitting: Internal Medicine

## 2022-04-24 VITALS — BP 155/83 | HR 70 | Temp 96.9°F | Resp 16 | Wt 146.0 lb

## 2022-04-24 DIAGNOSIS — L918 Other hypertrophic disorders of the skin: Secondary | ICD-10-CM

## 2022-04-24 DIAGNOSIS — I10 Essential (primary) hypertension: Secondary | ICD-10-CM

## 2022-04-24 DIAGNOSIS — L820 Inflamed seborrheic keratosis: Secondary | ICD-10-CM | POA: Diagnosis not present

## 2022-04-24 DIAGNOSIS — H02826 Cysts of left eye, unspecified eyelid: Secondary | ICD-10-CM | POA: Diagnosis not present

## 2022-04-24 NOTE — Progress Notes (Signed)
Future Appointments  Date Time Provider Department  04/24/2022  4:00 PM Unk Pinto, MD [facility]  07/12/2022             6 mo OV  2:30 PM Unk Pinto, MD [facility]  10/15/2022             Wellness 10:30 AM Alycia Rossetti, NP [facility]  01/16/2023                CPE 10:00 AM Unk Pinto, MD [facility]    History of Present Illness:     Patient is a bvery nice 76 yo [name]  with HTN present today with sl elevated BP 155/83.  HTensive systems review is negative. She also has concerns of # 2 irritated skin tags of the lower Right neck, An irritate draised scaly brown lesion of the upper Right mid chest. Lastly, she has an apparent inflamed sebaceous cyst at the proximal bridge of the left nose medial to the LE inner canthus.  Medications   Current Outpatient Medications (Cardiovascular):    atenolol (TENORMIN) 50 MG tablet, TAKE ONE TABLET BY MOUTH EVERY DAY FOR BLOOD PRESSURE   rosuvastatin (CRESTOR) 10 MG tablet, TAKE ONE TABLET BY MOUTH DAILY FOR cholesterol  Current Outpatient Medications (Respiratory):    benzonatate (TESSALON) 200 MG capsule, Take  1 perle  3 x /day for  Cough   cetirizine (ZYRTEC) 10 MG tablet, Take 10 mg by mouth daily.  Current Outpatient Medications (Analgesics):    aspirin 81 MG tablet, Take 81 mg by mouth daily.   Current Outpatient Medications (Other):    Ascorbic Acid (VITAMIN C PO), Take 1,000 mg by mouth daily.    Cholecalciferol (VITAMIN D) 2000 UNITS tablet, Take 6,000 Units by mouth daily.    esomeprazole (NEXIUM) 40 MG capsule, TAKE ONE CAPSULE BY MOUTH DAILY TO prevent acid reflux & hoarseness   MAGNESIUM OXIDE PO*, Take 400 mg by mouth daily.   meclizine (ANTIVERT) 25 MG tablet, TAKE 1/2-1 TABLET BY MOUTH 2-3 TIMES DAILY AS NEEDED FOR dizziness/vertigo   Melatonin 5 MG TABS, Take by mouth. Patient takes 3 tablets to equal 15 mg at bedtime.   Multiple Vitamin (MULTIVITAMIN) tablet, Take 1 tablet by mouth daily.    multivitamin-lutein (OCUVITE-LUTEIN) CAPS capsule, Take 1 capsule by mouth daily.   Omega-3 Fatty Acids (FISH OIL) 1000 MG CAPS, Take 1 capsule by mouth daily.   OVER THE COUNTER MEDICATION, Takes Vitamin B Complex 1 daily   traZODone (DESYREL) 50 MG tablet, take 1/2-2 tablets at bedtime as needed for sleep   TURMERIC PO, Take 1,500 mg by mouth.   Zinc 50 MG TABS, Take 50 mg by mouth daily. * These medications belong to multiple therapeutic classes and are listed under each applicable group.  Problem list She has Essential hypertension; Hyperlipidemia, mixed; Abnormal glucose; Vitamin D deficiency; GERD; Medication management; Overweight (BMI 25.0-29.9); SVT (supraventricular tachycardia) ([place]); Insomnia; FHx: heart disease; Osteopenia; CKD (chronic kidney disease) stage 2, GFR 60-89 ml/min; Cough present for greater than 3 weeks; Vertigo; and COVID-19 (09/13/2021) on their problem list.   Observations/Objective:  BP (!) 155/83   Pulse 70   Temp (!) 96.9 F (36.1 C)   Resp 16   Wt 146 lb (66.2 kg)   SpO2 99%   BMI 28.04 kg/m   HEENT - WNL. Neck - supple.  Chest - Clear equal BS. Cor - Nl HS. RRR w/o sig MGR. PP 1(+). No edema. MS- FROM w/o deformities.  Gait Nl. Neuro -  Nl w/o focal abnormalities. Skin - (1)   3 mm   sebaceous cyst at left base if nose medial to inner canthus.             (2)   4 mm x 5 mm irritated appearing raised scaly brown lesion of upper medial  Right chest about  3rd Interspace level             (3) #2 irritated filamentous 6-8 mm polypoid lesions of the Right lower lateral neck.    Procedure   ( CPT  17000 and 17003  x 3 )      After informed consent a & aseptic prep - all 4 lesions were were anesthetized locally with Marcaine 0.5%  (total 1 ml) the lesions were deeply cauterized  by hyfrecation and the remnant crust was gently debrided to smooth skin surfaces.  Antibiotic sterile bandages applied & patient instructed in post-op wound care.  Assessment  and Plan:   1. Essential hypertension  - advised more freq BP monitoring & call back if BP > 1140/90.   2. Keratosis, inflamed seborrheic  3. Cyst, sebaceous, eyelid, left  5. Skin tag  Follow Up Instructions:       I discussed the assessment and treatment plan with the patient. The patient was provided an opportunity to ask questions and all were answered. The patient agreed with the plan and demonstrated an understanding of the instructions.       The patient was advised to call back or seek an in-person evaluation if the symptoms worsen or if the condition fails to improve as anticipated.   Kirtland Bouchard, MD

## 2022-05-18 ENCOUNTER — Other Ambulatory Visit: Payer: Self-pay | Admitting: Internal Medicine

## 2022-07-11 NOTE — Patient Instructions (Signed)

## 2022-07-11 NOTE — Progress Notes (Unsigned)
Future Appointments  Date Time Provider Department  07/12/2022             6 mo OV  2:30 PM Lucky Cowboy, MD [facility]  10/15/2022            Wellness 10:30 AM Raynelle Dick, NP [facility]  01/16/2023               CPE  10:00 AM Lucky Cowboy, MD [facility]    History of Present Illness:       This very nice 76 y.o. [name] presents for 6 month follow up with HTN, HLD, Pre-Diabetes and Vitamin D Deficiency.        Patient is treated for HTN (2012) & BP has been controlled at home.  Today's BP is at goal - 138/72 . Patient has hx/o pSVT x 2  (2012 & 2017) requiring Adenosine CV in ER.  She had a Negative Myoview 2019. Patient has had no complaints of any cardiac type chest pain, palpitations, dyspnea / orthopnea / PND, dizziness, claudication, or dependent edema.       Hyperlipidemia is controlled with diet & meds. Patient denies myalgias or other med SE's. Last Lipids were at goal:  Lab Results  Component Value Date   CHOL 150 04/11/2022   HDL 76 04/11/2022   LDLCALC 56 04/11/2022   TRIG 98 04/11/2022   CHOLHDL 2.0 04/11/2022     Also, the patient has has been monitored for abnormal glucose and has had no symptoms of reactive hypoglycemia, diabetic polys, paresthesias or visual blurring.  Last A1c was normal  & at goal:  Lab Results  Component Value Date   HGBA1C 5.5 12/20/2021                                                      Further, the patient also has history of Vitamin D Deficiency ("9" /2009) and supplements vitamin D without any suspected side-effects. Last vitamin D was at goal:  Lab Results  Component Value Date   VD25OH 73 12/20/2021       Current Outpatient Medications on File Prior to Visit  Medication Sig   VITAMIN C  1,000 mg Take  daily.    aspirin 81 MG tablet Take daily.   atenolol (TENORMIN) 50 MG tablet TAKE ONE TABLET  EVERY DAY    benzonatate  100 MG capsule Take 1-2 capsules 3 times daily as needed for cough    VITAMIN D 2000 UNITS  tablet Take 6,000 Units  daily.    fexofenadine 180 MG tablet Take daily.   MAGNESIUM OXIDE  400 mg  Take daily.   meclizine  25 MG tablet TAKE 1/2-1 TABLET 2-3 TIMES DAILY AS NEEDE   Melatonin 5 MG TABS Patient takes 3 tablets to equal 15 mg at bedtime.   Multiple Vitamin Take 1 tablet daily.   OCUVITE-LUTEIN Take 1 capsule daily.   Omega-3  FISH OIL 1000 MG CAPS Take 1 capsule daily.   Vitamin B Complex  Takes 1 daily   rosuvastatin (CRESTOR) 10 MG tablet TAKE ONE TABLET DAILY    traZODone  50 MG tablet take 1/2-2 tablets at bedtime as needed   TURMERIC Take    Zinc 50 MG TABS Take 100 mg  daily.     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   GERD 10/20/2013   Hyperlipidemia 10/20/2013   Migraine, unspecified, without mention of intractable migraine without mention of status migrainosus 10/20/2013   Morbid obesity ([facility]) 11/18/2014   Prediabetes 10/20/2013   SVT (supraventricular tachycardia) ([facility]) 11/15/2015   Vitamin D deficiency 10/20/2013     Immunization History  Administered Date(s) Administered   DTaP 09/12/2013   Influenza Split 09/12/2013   Influenza, High Dose  10/04/2014, 08/30/2017   Pneumococcal  -  13 10/04/2014   Pneumococcal -  23 03/29/2009   Td 12/20/2003   Tdap 09/12/2013   Zoster, Live 06/07/2010     Past Surgical History:  Procedure Laterality Date   NO PAST SURGERIES      FHx:    Reviewed / unchanged  SHx:    Reviewed / unchanged   Systems Review:  Constitutional: Denies fever, chills, wt changes, headaches, insomnia, fatigue, night sweats, change in appetite. Eyes: Denies redness, blurred vision, diplopia, discharge, itchy, watery eyes.  ENT: Denies discharge, congestion, post nasal drip, epistaxis, sore throat, earache,  hearing loss, dental pain, tinnitus, vertigo, sinus pain, snoring.  CV: Denies chest pain, palpitations, irregular heartbeat, syncope, dyspnea, diaphoresis, orthopnea, PND, claudication or edema. Respiratory: denies cough, dyspnea, DOE, pleurisy, hoarseness, laryngitis, wheezing.  Gastrointestinal: Denies dysphagia, odynophagia, heartburn, reflux, water brash, abdominal pain or cramps, nausea, vomiting, bloating, diarrhea, constipation, hematemesis, melena, hematochezia  or hemorrhoids. Genitourinary: Denies dysuria, frequency, urgency, nocturia, hesitancy, discharge, hematuria or flank pain. Musculoskeletal: Denies arthralgias, myalgias, stiffness, jt. swelling, pain, limping or strain/sprain.  Skin: Denies pruritus, rash, hives, warts, acne, eczema or change in skin lesion(s). Neuro: No weakness, tremor, incoordination, spasms, paresthesia or pain. Psychiatric: Denies confusion, memory loss or sensory loss. Endo: Denies change in weight, skin or hair change.  Heme/Lymph: No excessive bleeding, bruising or enlarged lymph nodes.  Physical Exam  BP 138/72   Pulse 75   Temp 97.6 F (36.4 C)   Resp 16   Ht 5' 0.05" (1.525 m)   Wt 141 lb 12.8 oz (64.3 kg)   SpO2 98%   BMI 27.65 kg/m   Appears  well nourished, well groomed  and in no distress.  Eyes: PERRLA, EOMs, conjunctiva no swelling or erythema. Sinuses: No frontal/maxillary tenderness ENT/Mouth: EAC's clear, TM's nl w/o erythema, bulging. Nares clear w/o erythema, swelling, exudates. Oropharynx clear without erythema or exudates. Oral hygiene is good. Tongue normal, non obstructing. Hearing intact.  Neck: Supple. Thyroid not palpable. Car 2+/2+ without bruits, nodes or JVD. Chest: Respirations nl with BS clear & equal w/o rales, rhonchi, wheezing or stridor.  Cor: Heart sounds normal w/ regular rate and rhythm without sig. murmurs, gallops, clicks or rubs. Peripheral pulses normal and equal  without edema.  Abdomen: Soft & bowel  sounds normal. Non-tender w/o guarding, rebound, hernias, masses or organomegaly.  Lymphatics: Unremarkable.  Musculoskeletal: Full ROM all peripheral extremities, joint stability, 5/5 strength and normal gait.  Skin: Warm, dry without exposed rashes, lesions or ecchymosis apparent.  Neuro: Cranial nerves intact, reflexes equal bilaterally. Sensory-motor testing grossly intact. Tendon reflexes grossly intact.  Pysch: Alert & oriented x 3.  Insight and judgement nl & appropriate. No ideations.  Assessment and Plan:  1. Essential hypertension   - Continue medication, monitor blood  pressure at home.  - Continue DASH diet.  Reminder to go to the ER if any CP,  SOB, nausea, dizziness, severe HA, changes vision/speech.    - CBC with Differential/Platelet - COMPLETE METABOLIC PANEL WITH GFR - Magnesium - TSH   2. Hyperlipidemia, mixed   - Continue diet/meds, exercise,& lifestyle modifications.  - Continue monitor periodic cholesterol/liver & renal functions     - Lipid panel - TSH   3. Vitamin D deficiency  - Continue supplementation.  - Hemoglobin A1c - Insulin, random  ' 4. Abnormal glucose  - Continue diet, exercise  - Lifestyle modifications.  - Monitor appropriate labs   - VITAMIN D 25 Hydroxy   5. Medication management  - CBC with Differential/Platelet - COMPLETE METABOLIC PANEL WITH GFR - Magnesium - Lipid panel - TSH - Hemoglobin A1c - Insulin, random - VITAMIN D 25 Hydroxy          Discussed  regular exercise, BP monitoring, weight control to achieve/maintain BMI less than 25 and discussed med and SE's. Recommended labs to assess and monitor clinical status with further disposition pending results of labs.  I discussed the assessment and treatment plan with the patient. The patient was provided an opportunity to ask questions and all were answered. The patient agreed with the plan and demonstrated an understanding of the instructions.  I provided over 30  minutes of exam, counseling, chart review and  complex critical decision making.        The patient was advised to call back or seek an in-person evaluation if the symptoms worsen or if the condition fails to improve as anticipated.   Marinus Maw, MD

## 2022-07-12 ENCOUNTER — Ambulatory Visit (INDEPENDENT_AMBULATORY_CARE_PROVIDER_SITE_OTHER): Payer: Medicare Other | Admitting: Internal Medicine

## 2022-07-12 ENCOUNTER — Encounter: Payer: Self-pay | Admitting: Internal Medicine

## 2022-07-12 VITALS — BP 138/72 | HR 75 | Temp 97.6°F | Resp 16 | Ht 60.05 in | Wt 141.8 lb

## 2022-07-12 DIAGNOSIS — Z79899 Other long term (current) drug therapy: Secondary | ICD-10-CM

## 2022-07-12 DIAGNOSIS — E559 Vitamin D deficiency, unspecified: Secondary | ICD-10-CM | POA: Diagnosis not present

## 2022-07-12 DIAGNOSIS — E782 Mixed hyperlipidemia: Secondary | ICD-10-CM

## 2022-07-12 DIAGNOSIS — I10 Essential (primary) hypertension: Secondary | ICD-10-CM | POA: Diagnosis not present

## 2022-07-12 DIAGNOSIS — R7309 Other abnormal glucose: Secondary | ICD-10-CM | POA: Diagnosis not present

## 2022-07-13 LAB — COMPLETE METABOLIC PANEL WITH GFR
AG Ratio: 2.3 (calc) (ref 1.0–2.5)
ALT: 15 U/L (ref 6–29)
AST: 23 U/L (ref 10–35)
Albumin: 4.4 g/dL (ref 3.6–5.1)
Alkaline phosphatase (APISO): 73 U/L (ref 37–153)
BUN: 14 mg/dL (ref 7–25)
CO2: 24 mmol/L (ref 20–32)
Calcium: 9.2 mg/dL (ref 8.6–10.4)
Chloride: 105 mmol/L (ref 98–110)
Creat: 0.87 mg/dL (ref 0.60–1.00)
Globulin: 1.9 g/dL (calc) (ref 1.9–3.7)
Glucose, Bld: 102 mg/dL — ABNORMAL HIGH (ref 65–99)
Potassium: 4.1 mmol/L (ref 3.5–5.3)
Sodium: 138 mmol/L (ref 135–146)
Total Bilirubin: 0.5 mg/dL (ref 0.2–1.2)
Total Protein: 6.3 g/dL (ref 6.1–8.1)
eGFR: 69 mL/min/{1.73_m2} (ref >=60)

## 2022-07-13 LAB — CBC WITH DIFFERENTIAL/PLATELET
Absolute Monocytes: 563 cells/uL (ref 200–950)
Basophils Absolute: 58 cells/uL (ref 0–200)
Basophils Relative: 0.9 %
Eosinophils Absolute: 70 cells/uL (ref 15–500)
Eosinophils Relative: 1.1 %
HCT: 39.5 % (ref 35.0–45.0)
Hemoglobin: 13.6 g/dL (ref 11.7–15.5)
Lymphs Abs: 832 cells/uL — ABNORMAL LOW (ref 850–3900)
MCH: 29.8 pg (ref 27.0–33.0)
MCHC: 34.4 g/dL (ref 32.0–36.0)
MCV: 86.6 fL (ref 80.0–100.0)
MPV: 9.4 fL (ref 7.5–12.5)
Monocytes Relative: 8.8 %
Neutro Abs: 4877 cells/uL (ref 1500–7800)
Neutrophils Relative %: 76.2 %
Platelets: 273 10*3/uL (ref 140–400)
RBC: 4.56 10*6/uL (ref 3.80–5.10)
RDW: 12.2 % (ref 11.0–15.0)
Total Lymphocyte: 13 %
WBC: 6.4 10*3/uL (ref 3.8–10.8)

## 2022-07-13 LAB — VITAMIN D 25 HYDROXY (VIT D DEFICIENCY, FRACTURES): Vit D, 25-Hydroxy: 83 ng/mL (ref 30–100)

## 2022-07-13 LAB — LIPID PANEL
Cholesterol: 129 mg/dL (ref <200)
HDL: 65 mg/dL (ref >=50)
LDL Cholesterol (Calc): 43 mg/dL (calc)
Non-HDL Cholesterol (Calc): 64 mg/dL (calc) (ref <130)
Total CHOL/HDL Ratio: 2 (calc) (ref <5.0)
Triglycerides: 127 mg/dL (ref <150)

## 2022-07-13 LAB — TSH: TSH: 0.97 mIU/L (ref 0.40–4.50)

## 2022-07-13 LAB — MAGNESIUM: Magnesium: 1.8 mg/dL (ref 1.5–2.5)

## 2022-07-13 LAB — HEMOGLOBIN A1C
Hgb A1c MFr Bld: 5.3 % of total Hgb (ref <5.7)
Mean Plasma Glucose: 105 mg/dL
eAG (mmol/L): 5.8 mmol/L

## 2022-07-13 LAB — INSULIN, RANDOM: Insulin: 11 u[IU]/mL

## 2022-07-15 ENCOUNTER — Encounter: Payer: Self-pay | Admitting: Internal Medicine

## 2022-07-15 NOTE — Progress Notes (Signed)
<><><><><><><><><><><><><><><><><><><><><><><><><><><><><><><><><> <><><><><><><><><><><><><><><><><><><><><><><><><><><><><><><><><> -   Test results slightly outside the reference range are not unusual. If there is anything important, I will review this with you,  otherwise it is considered normal test values.  If you have further questions,  please do not hesitate to contact me at the office or via My Chart.  <><><><><><><><><><><><><><><><><><><><><><><><><><><><><><><><><> <><><><><><><><><><><><><><><><><><><><><><><><><><><><><><><><><>  -   Magnesium  -   1.8  -  very  low- goal is betw 2.0 - 2.5,   - So..............Marland Kitchen  Recommend that you take                                                                Magnesium 500 mg tablet daily   - also important to eat lots of  leafy green vegetables  - spinach - Kale - collards - greens - okra - asparagus  - broccoli - quinoa - squash - almonds  - black, red, white beans -  peas - green beans  <><><><><><><><><><><><><><><><><><><><><><><><><><><><><><><><><> <><><><><><><><><><><><><><><><><><><><><><><><><><><><><><><><><>  - Total Chol = 129  & LDL Chol = 43   - Both  Excellent   - Very low risk for Heart Attack  / Stroke  - Suggest you cut your Chol med in 1/2  <><><><><><><><><><><><><><><><><><><><><><><><><><><><><><><><><> <><><><><><><><><><><><><><><><><><><><><><><><><><><><><><><><><>  - A1c - Normal  - Great,  No Diabetes <><><><><><><><><><><><><><><><><><><><><><><><><><><><><><><><><> <><><><><><><><><><><><><><><><><><><><><><><><><><><><><><><><><>  - Vitamin D  83 - Excellent  <><><><><><><><><><><><><><><><><><><><><><><><><><><><><><><><><> <><><><><><><><><><><><><><><><><><><><><><><><><><><><><><><><><>  - All Else - CBC - Kidneys - Electrolytes - Liver - Magnesium & Thyroid    - all  Normal /  OK <><><><><><><><><><><><><><><><><><><><><><><><><><><><><><><><><> <><><><><><><><><><><><><><><><><><><><><><><><><><><><><><><><><>                      <><><><><><><><><><><><><><><><><><><><><><><><><><><><><><><><><> <><><><><><><><><><><><><><><><><><><><><><><><><><><><><><><><><>  -

## 2022-08-17 ENCOUNTER — Other Ambulatory Visit: Payer: Self-pay | Admitting: Internal Medicine

## 2022-08-17 DIAGNOSIS — Z1231 Encounter for screening mammogram for malignant neoplasm of breast: Secondary | ICD-10-CM

## 2022-08-20 DIAGNOSIS — Z1211 Encounter for screening for malignant neoplasm of colon: Secondary | ICD-10-CM | POA: Diagnosis not present

## 2022-08-20 DIAGNOSIS — Z1212 Encounter for screening for malignant neoplasm of rectum: Secondary | ICD-10-CM | POA: Diagnosis not present

## 2022-08-28 LAB — COLOGUARD: COLOGUARD: NEGATIVE

## 2022-08-28 NOTE — Progress Notes (Signed)
<><><><><><><><><><><><><><><><><><><><><><><><><><><><><><><><><> <><><><><><><><><><><><><><><><><><><><><><><><><><><><><><><><><> -   Test results slightly outside the reference range are not unusual. If there is anything important, I will review this with you,  otherwise it is considered normal test values.  If you have further questions,  please do not hesitate to contact me at the office or via My Chart.  <><><><><><><><><><><><><><><><><><><><><><><><><><><><><><><><><> <><><><><><><><><><><><><><><><><><><><><><><><><><><><><><><><><>  -  Cologuard  - NEGATIVE  !      Great  - Repeat in 3 years.

## 2022-09-05 ENCOUNTER — Ambulatory Visit: Payer: Medicare Other | Admitting: Adult Health

## 2022-09-12 ENCOUNTER — Other Ambulatory Visit: Payer: Self-pay | Admitting: Internal Medicine

## 2022-09-12 DIAGNOSIS — H8110 Benign paroxysmal vertigo, unspecified ear: Secondary | ICD-10-CM

## 2022-09-27 ENCOUNTER — Ambulatory Visit: Payer: Medicare Other

## 2022-10-09 ENCOUNTER — Ambulatory Visit
Admission: RE | Admit: 2022-10-09 | Discharge: 2022-10-09 | Disposition: A | Discharge status: Home / Self Care | Payer: Medicare Other | Source: Ambulatory Visit | Attending: Internal Medicine | Admitting: Internal Medicine

## 2022-10-09 DIAGNOSIS — Z1231 Encounter for screening mammogram for malignant neoplasm of breast: Secondary | ICD-10-CM

## 2022-10-10 ENCOUNTER — Other Ambulatory Visit: Payer: Self-pay | Admitting: Internal Medicine

## 2022-10-10 DIAGNOSIS — R928 Other abnormal and inconclusive findings on diagnostic imaging of breast: Secondary | ICD-10-CM

## 2022-10-11 NOTE — Progress Notes (Signed)
MEDICARE ANNUAL WELLNESS VISIT AND FOLLOW UP  Assessment:    Annual Medicare Wellness Visit Due annually  Health maintenance reviewed  Essential hypertension - continue medications, DASH diet, exercise and monitor at home. Call if greater than 130/80.  -     CBC with Differential/Platelet -     CMP/GFR  SVT (supraventricular tachycardia) ([place]) Had ED visits x 2, saw cardiology Controlled with atenolol  No symptoms at this time Continue cardio follow up as recommended  GERD Continue PPI, persistent cough improved, take PPI at night Next OV plan attempt to taper dose Diet/lifestyle discussed; raise HOB on blocks, avoid large/fatty meals, avoid reclining 4 hours after meals  Hyperlipidemia -continue medications, check lipids q63m decrease fatty foods, increase activity.  - lipid panel  Other abnormal glucose Recent A1Cs at goal Discussed diet/exercise, weight management  Defer A1C; check CMP  Vitamin D deficiency At goal at recent check; continue to recommend supplementation for goal of 70-100 Defer vitamin D level  Overweight Lifestyle discussed; increase exercise  Insomnia/anxiety Trazodone 75 mg and melatonin 5 mg working well; Stress management techniques discussed, increase water, good sleep hygiene discussed, increase exercise, and increase veggies.   Family history of CAD Female family members with MI and CAD requiring stents Had normal myoview 09/2018 by Dr. NJohnsie CancelControl blood pressure, cholesterol, glucose, increase exercise.   Osteopenia - get dexa q2y, continue Vit D and Ca, weight bearing exercises  Vertigo Dexamethasone/meclizine PRN  Medication Management - magnesium    Over 30 minutes of exam, counseling, chart review, and critical decision making was performed  Future Appointments  Date Time Provider DCl[place] 10/18/2022  3:20 PM [facility] DIAG [place] 1 [facility]  10/18/2022  3:30 PM [facility] [place] [facility]   01/16/2023 10:00 AM MUnk Pinto MD [facility] None  04/19/2023 10:00 AM WAlycia Rossetti NP [facility] None     Plan:   During the course of the visit the patient was educated and counseled about appropriate screening and preventive services including:   Pneumococcal vaccine  Influenza vaccine Td vaccine Prevnar 13 Screening electrocardiogram Screening mammography Bone densitometry screening Colorectal cancer screening Diabetes screening Glaucoma screening Nutrition counseling  Advanced directives: given info/requested copies   Subjective:   Jean MCMACKINis a 76y.o. female who presents for Medicare Annual Wellness Visit and 3 month follow up on hypertension, prediabetes, hyperlipidemia, vitamin D def.   Husband passed away in 202/22/2020 has occasional down moments (never full days) but does regular devotions and gratefulness/awareness and managing very well. She is prescribed trazodone 75 mg for mood/insomnia, also uses 5 mg melatonin, tapered off of xanax. Back to church has been helpful.   Hx of migraines, improved after retiring, now with headaches that respond well to tylenol.   She has flares of vertigo, inner ear L >R for many years with intermittent flares, has meclizine and PRN dexamethasone that helps.   She had persistent cough, now on nexium with perceived benefit, was having breakthrough with omzeprazole OTC. She alternates allegra/zyrtec for allergies.   BMI is Body mass index is 28.07 kg/m., she has been working on diet and exercise, does walk daily 15-20 min, also very active in yard, mowing several acres.  Wt Readings from Last 3 Encounters:  10/15/22 146 lb 9.6 oz (66.5 kg)  07/12/22 141 lb 12.8 oz (64.3 kg)  04/24/22 146 lb (66.2 kg)   She has hx of episodes of SVT for which she presented to ED x2;  improved with addition of atenolol.  She has family hx of CAD, underwent myoview 09/2018 by Dr. Johnsie Cancel which was normal.   Her blood pressure has been  controlled at home, today their BP is BP: (!) 142/70  BP Readings from Last 3 Encounters:  10/15/22 (!) 142/70  07/12/22 138/72  04/24/22 (!) 155/83  She does workout. She denies chest pain, shortness of breath, dizziness.    She is on cholesterol medication (rosuvastatin 10 mg daily). Her cholesterol is not at goal. The cholesterol last visit was:   Lab Results  Component Value Date   CHOL 129 07/12/2022   HDL 65 07/12/2022   LDLCALC 43 07/12/2022   TRIG 127 07/12/2022   CHOLHDL 2.0 07/12/2022    Last A1C in the office was:  Lab Results  Component Value Date   HGBA1C 5.3 07/12/2022   CKD II monitored here. Last GFR Lab Results  Component Value Date   EGFR 69 07/12/2022    Patient is on Vitamin D supplement. Lab Results  Component Value Date   VD25OH 83 07/12/2022      Medication Review Current Outpatient Medications on File Prior to Visit  Medication Sig Dispense Refill   Ascorbic Acid (VITAMIN C PO) Take 1,000 mg by mouth daily.      aspirin 81 MG tablet Take 81 mg by mouth daily.     atenolol (TENORMIN) 50 MG tablet Take  1 tablet  Daily for BP                                                          /                                                                             Take                                     ( 50 mg )                                  by                                     mouth 90 tablet 3   cetirizine (ZYRTEC) 10 MG tablet Take 10 mg by mouth daily.     Cholecalciferol (VITAMIN D) 2000 UNITS tablet Take 6,000 Units by mouth daily.      esomeprazole (NEXIUM) 40 MG capsule TAKE ONE CAPSULE BY MOUTH DAILY TO prevent acid reflux & hoarseness 90 capsule 3   MAGNESIUM OXIDE PO Take 400 mg by mouth daily.     meclizine (ANTIVERT) 25 MG tablet TAKE 1/2-1 TABLET BY MOUTH 2-3 TIMES DAILY AS NEEDED FOR dizziness/vertigo 270 tablet 3   Melatonin 5 MG TABS Take by mouth. Patient takes 3  tablets to equal 15 mg at bedtime.     Multiple Vitamin  (MULTIVITAMIN) tablet Take 1 tablet by mouth daily.     multivitamin-lutein (OCUVITE-LUTEIN) CAPS capsule Take 1 capsule by mouth daily.     Omega-3 Fatty Acids (FISH OIL) 1000 MG CAPS Take 1 capsule by mouth daily.     OVER THE COUNTER MEDICATION Takes Vitamin B Complex 1 daily     rosuvastatin (CRESTOR) 10 MG tablet TAKE ONE TABLET BY MOUTH DAILY FOR cholesterol 90 tablet 3   traZODone (DESYREL) 50 MG tablet take 1/2-2 tablets by mouth at bedtime as needed for sleep 180 tablet 3   TURMERIC PO Take 1,500 mg by mouth.     Zinc 50 MG TABS Take 50 mg by mouth daily.     No current facility-administered medications on file prior to visit.    Current Problems (verified) Patient Active Problem List   Diagnosis Date Noted   COVID-19 (09/13/2021) 09/14/2021   Vertigo 09/05/2021   Cough present for greater than 3 weeks 03/20/2021   CKD (chronic kidney disease) stage 2, GFR 60-89 ml/min 12/11/2020   Osteopenia 09/05/2018   FHx: heart disease 06/18/2018   Insomnia 12/06/2017   SVT (supraventricular tachycardia) 11/15/2015   Overweight (BMI 25.0-29.9) 11/18/2014   Medication management 04/20/2014   Essential hypertension 10/20/2013   Hyperlipidemia, mixed 10/20/2013   Abnormal glucose 10/20/2013   Vitamin D deficiency 10/20/2013   GERD 10/20/2013    Screening Tests Immunization History  Administered Date(s) Administered   DTaP 09/12/2013   Influenza Split 09/12/2013   Influenza, High Dose Seasonal PF 10/04/2014, 08/30/2017   Pneumococcal Conjugate-13 10/04/2014   Pneumococcal Polysaccharide-23 03/29/2009   Td 12/20/2003   Tdap 09/12/2013   Zoster, Live 06/07/2010    Preventative care: Last colonoscopy: 2002, 2008 norm Cologuard: 08/2019, 08/20/22 negative Last mammogram: 10/09/22 additional imaging requested PAP/pelvic: 2019 at GYN, Dr. Ulanda Edison following DEXA: 09/2020 spine T -2.2, calcium   Immunization History  Administered Date(s) Administered   DTaP 09/12/2013   Influenza  Split 09/12/2013   Influenza, High Dose Seasonal PF 10/04/2014, 08/30/2017   Pneumococcal Conjugate-13 10/04/2014   Pneumococcal Polysaccharide-23 03/29/2009   Td 12/20/2003   Tdap 09/12/2013   Zoster, Live 06/07/2010   Health Maintenance  Topic Date Due   COVID-19 Vaccine (1) 10/31/2022 (Originally 05/25/1951)   Zoster Vaccines- Shingrix (1 of 2) 01/14/2023 (Originally 05/24/1965)   INFLUENZA VACCINE  02/10/2023 (Originally 06/12/2022)   Pneumonia Vaccine 60+ Years old (3 - PPSV23 or PCV20) 10/16/2023 (Originally 10/05/2015)   DTaP/Tdap/Td (4 - Td or Tdap) 09/13/2023   Medicare Annual Wellness (AWV)  10/16/2023   DEXA SCAN  Completed   Hepatitis C Screening  Completed   HPV VACCINES  Aged Out   COLONOSCOPY (Pts 45-60yr Insurance coverage will need to be confirmed)  Discontinued   Fecal DNA (Cologuard)  Discontinued     Names of Other Physician/Practitioners you currently use: 1. [facility]- here for primary care 2. Dr. GKaty Fitch, eye doctor, last visit 2023 3. Dr. FWoodfin Ganja dentist, last visit 2023 goes q652mPatient Care Team: McUnk PintoMD as PCP - General (Internal Medicine) NiJosue HectorMD as PCP - Cardiology (Cardiology) KaInda CastleMD (Inactive) as Consulting Physician (Gastroenterology) HeNewton PiggMD as Consulting Physician (Obstetrics and Gynecology)  Allergies Allergies  Allergen Reactions   Penicillins Anaphylaxis   Codeine Nausea And Vomiting   Sudafed [Pseudoephedrine Hcl]     Makes heart race, per pt  SURGICAL HISTORY She  has a past surgical history that includes No past surgeries. FAMILY HISTORY Her family history includes Anuerysm in her sister; Breast cancer in her mother; CAD in her mother; Heart attack (age of onset: 52) in her sister; Heart disease in her mother; Kidney failure in her paternal uncle; Lung cancer in her father; Polycystic kidney disease in her paternal uncle; Stroke in her  maternal grandmother. SOCIAL HISTORY She  reports that she has never smoked. She has never used smokeless tobacco. She reports that she does not drink alcohol.  MEDICARE WELLNESS OBJECTIVES: Physical activity: Current Exercise Habits: Home exercise routine, Type of exercise: walking, Time (Minutes): 30, Frequency (Times/Week): 6, Weekly Exercise (Minutes/Week): 180, Intensity: Mild, Exercise limited by: None identified Cardiac risk factors: Cardiac Risk Factors include: advanced age (>47mn, >>75women);dyslipidemia;hypertension Depression/mood screen:      10/15/2022   10:37 AM  Depression screen PHQ 2/9  Decreased Interest 0  Down, Depressed, Hopeless 0  PHQ - 2 Score 0    ADLs:     10/15/2022   10:38 AM 07/12/2022    6:57 PM  In your present state of health, do you have any difficulty performing the following activities:  Hearing? 0 0  Vision? 0 0  Difficulty concentrating or making decisions? 0 0  Walking or climbing stairs? 0 0  Dressing or bathing? 0 0  Doing errands, shopping? 0 0    Cognitive Testing  Alert? Yes  Normal Appearance?Yes  Oriented to person? Yes  Place? Yes   Time? Yes  Recall of three objects?  Yes  Can perform simple calculations? Yes  Displays appropriate judgment?Yes  Can read the correct time from a watch face?Yes  EOL planning: Does Patient Have a Medical Advance Directive?: Yes Type of Advance Directive: Healthcare Power of Attorney, Living will Does patient want to make changes to medical advance directive?: No - Patient declined [place] of [place] No - copy requested   Objective:   Today's Vitals   10/15/22 1028  BP: (!) 142/70  Pulse: 67  Resp: 17  Temp: 97.9 F (36.6 C)  SpO2: 97%  Weight: 146 lb 9.6 oz (66.5 kg)  Height: 5' 0.6" (1.539 m)     Body mass index is 28.07 kg/m.  General appearance: alert, no distress, WD/WN,  female HEENT: normocephalic, sclerae anicteric, TMs pearly, nares patent, no  discharge or erythema, pharynx normal Oral cavity: MMM, no lesions Neck: supple, no lymphadenopathy, no thyromegaly, no masses Heart: RRR, normal S1, S2, no murmurs Lungs: CTA bilaterally, no wheezes, rhonchi, or rales Abdomen: +bs, soft, non tender, + distended/full lower AB, no masses, no hepatomegaly, no splenomegaly Musculoskeletal: nontender, no swelling, no obvious deformity Extremities: no edema, no cyanosis, no clubbing Pulses: 2+ symmetric, upper and lower extremities, normal cap refill Neurological: alert, oriented x 3, CN2-12 intact, strength normal upper extremities and lower extremities, sensation normal throughout, DTRs 2+ throughout, no cerebellar signs, gait normal Psychiatric: normal affect, behavior normal, pleasant   Medicare Attestation I have personally reviewed: The patient's medical and social history Their use of alcohol, tobacco or illicit drugs Their current medications and supplements The patient's functional ability including ADLs,fall risks, home safety risks, cognitive, and hearing and visual impairment Diet and physical activities Evidence for depression or mood disorders  The patient's weight, height, BMI, and visual acuity have been recorded in the chart.  I have made referrals, counseling, and provided education to the patient based on review of the  above and I have provided the patient with a written personalized care plan for preventive services.     Alycia Rossetti, NP   10/15/2022

## 2022-10-13 ENCOUNTER — Other Ambulatory Visit: Payer: Self-pay | Admitting: Internal Medicine

## 2022-10-13 MED ORDER — ATENOLOL 50 MG PO TABS: ORAL_TABLET | ORAL | 3 refills | Status: DC

## 2022-10-15 ENCOUNTER — Ambulatory Visit (INDEPENDENT_AMBULATORY_CARE_PROVIDER_SITE_OTHER): Payer: Medicare Other | Admitting: Nurse Practitioner

## 2022-10-15 ENCOUNTER — Encounter: Payer: Self-pay | Admitting: Nurse Practitioner

## 2022-10-15 VITALS — BP 142/70 | HR 67 | Temp 97.9°F | Resp 17 | Ht 60.6 in | Wt 146.6 lb

## 2022-10-15 DIAGNOSIS — Z79899 Other long term (current) drug therapy: Secondary | ICD-10-CM | POA: Diagnosis not present

## 2022-10-15 DIAGNOSIS — Z8249 Family history of ischemic heart disease and other diseases of the circulatory system: Secondary | ICD-10-CM

## 2022-10-15 DIAGNOSIS — M8588 Other specified disorders of bone density and structure, other site: Secondary | ICD-10-CM

## 2022-10-15 DIAGNOSIS — E663 Overweight: Secondary | ICD-10-CM

## 2022-10-15 DIAGNOSIS — R6889 Other general symptoms and signs: Secondary | ICD-10-CM

## 2022-10-15 DIAGNOSIS — E782 Mixed hyperlipidemia: Secondary | ICD-10-CM

## 2022-10-15 DIAGNOSIS — K2100 Gastro-esophageal reflux disease with esophagitis, without bleeding: Secondary | ICD-10-CM | POA: Diagnosis not present

## 2022-10-15 DIAGNOSIS — Z0001 Encounter for general adult medical examination with abnormal findings: Secondary | ICD-10-CM | POA: Diagnosis not present

## 2022-10-15 DIAGNOSIS — E559 Vitamin D deficiency, unspecified: Secondary | ICD-10-CM

## 2022-10-15 DIAGNOSIS — I471 Supraventricular tachycardia, unspecified: Secondary | ICD-10-CM | POA: Diagnosis not present

## 2022-10-15 DIAGNOSIS — R7309 Other abnormal glucose: Secondary | ICD-10-CM

## 2022-10-15 DIAGNOSIS — I10 Essential (primary) hypertension: Secondary | ICD-10-CM | POA: Diagnosis not present

## 2022-10-15 DIAGNOSIS — R42 Dizziness and giddiness: Secondary | ICD-10-CM

## 2022-10-15 DIAGNOSIS — N182 Chronic kidney disease, stage 2 (mild): Secondary | ICD-10-CM

## 2022-10-15 DIAGNOSIS — Z0000 Encounter for general adult medical examination without abnormal findings: Secondary | ICD-10-CM

## 2022-10-15 DIAGNOSIS — G4700 Insomnia, unspecified: Secondary | ICD-10-CM

## 2022-10-15 MED ORDER — BENZONATATE 200 MG PO CAPS: ORAL_CAPSULE | ORAL | 3 refills | Status: DC

## 2022-10-15 NOTE — Patient Instructions (Signed)

## 2022-10-16 LAB — CBC WITH DIFFERENTIAL/PLATELET
Absolute Monocytes: 572 cells/uL (ref 200–950)
Basophils Absolute: 59 cells/uL (ref 0–200)
Basophils Relative: 1 %
Eosinophils Absolute: 112 cells/uL (ref 15–500)
Eosinophils Relative: 1.9 %
HCT: 41.9 % (ref 35.0–45.0)
Hemoglobin: 13.9 g/dL (ref 11.7–15.5)
Lymphs Abs: 702 cells/uL — ABNORMAL LOW (ref 850–3900)
MCH: 29.4 pg (ref 27.0–33.0)
MCHC: 33.2 g/dL (ref 32.0–36.0)
MCV: 88.8 fL (ref 80.0–100.0)
MPV: 9.4 fL (ref 7.5–12.5)
Monocytes Relative: 9.7 %
Neutro Abs: 4455 cells/uL (ref 1500–7800)
Neutrophils Relative %: 75.5 %
Platelets: 282 10*3/uL (ref 140–400)
RBC: 4.72 10*6/uL (ref 3.80–5.10)
RDW: 12.3 % (ref 11.0–15.0)
Total Lymphocyte: 11.9 %
WBC: 5.9 10*3/uL (ref 3.8–10.8)

## 2022-10-16 LAB — LIPID PANEL
Cholesterol: 153 mg/dL (ref <200)
HDL: 71 mg/dL (ref >=50)
LDL Cholesterol (Calc): 62 mg/dL (calc)
Non-HDL Cholesterol (Calc): 82 mg/dL (calc) (ref <130)
Total CHOL/HDL Ratio: 2.2 (calc) (ref <5.0)
Triglycerides: 118 mg/dL (ref <150)

## 2022-10-16 LAB — COMPLETE METABOLIC PANEL WITH GFR
AG Ratio: 2 (calc) (ref 1.0–2.5)
ALT: 18 U/L (ref 6–29)
AST: 25 U/L (ref 10–35)
Albumin: 4.3 g/dL (ref 3.6–5.1)
Alkaline phosphatase (APISO): 79 U/L (ref 37–153)
BUN: 13 mg/dL (ref 7–25)
CO2: 27 mmol/L (ref 20–32)
Calcium: 9.4 mg/dL (ref 8.6–10.4)
Chloride: 103 mmol/L (ref 98–110)
Creat: 0.8 mg/dL (ref 0.60–1.00)
Globulin: 2.2 g/dL (calc) (ref 1.9–3.7)
Glucose, Bld: 97 mg/dL (ref 65–99)
Potassium: 4.7 mmol/L (ref 3.5–5.3)
Sodium: 139 mmol/L (ref 135–146)
Total Bilirubin: 0.4 mg/dL (ref 0.2–1.2)
Total Protein: 6.5 g/dL (ref 6.1–8.1)
eGFR: 76 mL/min/{1.73_m2} (ref >=60)

## 2022-10-16 LAB — MAGNESIUM: Magnesium: 2.1 mg/dL (ref 1.5–2.5)

## 2022-10-18 ENCOUNTER — Ambulatory Visit: Payer: Medicare Other

## 2022-10-18 ENCOUNTER — Ambulatory Visit
Admission: RE | Admit: 2022-10-18 | Discharge: 2022-10-18 | Disposition: A | Discharge status: Home / Self Care | Payer: Medicare Other | Source: Ambulatory Visit | Attending: Internal Medicine | Admitting: Internal Medicine

## 2022-10-18 DIAGNOSIS — R928 Other abnormal and inconclusive findings on diagnostic imaging of breast: Secondary | ICD-10-CM

## 2022-10-18 DIAGNOSIS — R92331 Mammographic heterogeneous density, right breast: Secondary | ICD-10-CM | POA: Diagnosis not present

## 2022-12-05 DIAGNOSIS — K080 Exfoliation of teeth due to systemic causes: Secondary | ICD-10-CM | POA: Diagnosis not present

## 2022-12-26 ENCOUNTER — Encounter: Payer: Medicare Other | Admitting: Internal Medicine

## 2023-01-15 ENCOUNTER — Encounter: Payer: Self-pay | Admitting: Internal Medicine

## 2023-01-15 NOTE — Patient Instructions (Signed)

## 2023-01-15 NOTE — Progress Notes (Unsigned)
Annual Screening/Preventative Visit & Comprehensive Evaluation &  Examination   Future Appointments  Date Time Provider Department  01/16/2023                      cpe 10:00 AM Unk Pinto, MD [facility]  04/19/2023                     wellness 10:00 AM Alycia Rossetti, NP [facility]  01/21/2024                    cpe 10:00 AM Unk Pinto, MD [facility]        This very nice 77 y.o.  [name] presents for a Screening /Preventative Visit & comprehensive evaluation and management of multiple medical co-morbidities.  Patient has been followed for HTN, HLD, Prediabetes  and Vitamin D Deficiency. Patient has hx/o LPRD & cough  controlled on Nexium.          HTN predates since  2012.  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 In 2019 , she had a negative Myoview with EF 74%. Patient's BP has been controlled at home and patient denies any cardiac symptoms as chest pain, palpitations, shortness of breath, dizziness or ankle swelling.  Patient has CKD2  (GFR 78).  Today's BP is at goal - 120/68 .        Patient's hyperlipidemia is controlled with diet and Rosuvastatin.  Patient denies myalgias or other medication SE's. Last lipids were at goal :  Lab Results  Component Value Date   CHOL 153 10/15/2022   HDL 71 10/15/2022   LDLCALC 62 10/15/2022   TRIG 118 10/15/2022   CHOLHDL 2.2 10/15/2022         Patient has been followed /monitored expectantly for glucose intolerance and patient denies reactive hypoglycemic symptoms, visual blurring, diabetic polys or paresthesias. Last A1c was normal & at goal :  Lab Results  Component Value Date   HGBA1C 5.3 07/12/2022         Finally, patient has history of Vitamin D Deficiency ("9" /2009) and last Vitamin D was at goal : Lab Results  Component Value Date   VD25OH 83 07/12/2022       Current Outpatient Medications on File Prior to Visit  Medication Sig   VITAMIN C 1,000 mg Take daily.     aspirin 81 MG tablet Take  daily.   atenolol 50 MG tablet TAKE ONE TABLET  EVERY DAY    cetirizine 10 MG tablet Take 1 daily.   VITAMIN D 2000 UNITS  Take 6,000 Units daily.    esomeprazole 40 MG capsule Take  1 capsule  Daily    MAGNESIUM OXIDE 400 mg  Take  daily.   meclizine  25 MG tablet TAKE 1/2-1 TAB  2-3 TIMES DAILY AS NEEDED    Melatonin 5 MG TABS Patient takes 3 tablets to equal 15 mg at bedtime.   Multiple Vitamin  Take 1 tablet  daily.   multivitamin-lutein  Take 1 capsule daily.   Omega-3 FISH OIL 1000 MG CAPS Take 1 capsule daily.   Vitamin B Complex Takes  1 daily   rosuvastatin 10 MG tablet TAKE ONE TABLET  DAILY   traZODone 50 MG tablet take 1/2-2 tablets at bedtime as needed for sleep   TURMERIC 1,500 mg Take  daily   Zinc 50 MG TABS Take daily.  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   GERD 10/20/2013   Hyperlipidemia 10/20/2013   Migraine 10/20/2013   Morbid obesity ([place]) 11/18/2014   Prediabetes 10/20/2013   SVT (supraventricular tachycardia) ([place]) 11/15/2015   Vitamin D deficiency 10/20/2013     Health Maintenance  Topic Date Due   COVID-19 Vaccine (1) Never done   Zoster Vaccines- Shingrix (1 of 2) Never done   Pneumonia Vaccine 27+ Years old (3) 10/05/2015   INFLUENZA VACCINE  06/12/2021   Fecal DNA (Cologuard)  08/16/2022   TETANUS/TDAP  09/13/2023   DEXA SCAN  Completed   Hepatitis C Screening  Completed   HPV VACCINES  Aged Out    Immunization History  Administered Date(s) Administered   DTaP 09/12/2013   Influenza Split 09/12/2013   Influenza, High Dose  10/04/2014, 08/30/2017   Pneumococcal -13 10/04/2014   Pneumococcal -23 03/29/2009   Td 12/20/2003   Tdap 09/12/2013   Zoster, Live 06/07/2010    - Last Colon -  06/13/2007 - Dr Deatra Ina                      - 08/17/2019 - Cologard  -  Negative  - 08/28/2022 - Cologard  -  Negative   Last MGM -  10/18/2022   Past Surgical History:  Procedure Laterality Date   NO PAST SURGERIES       Family History  Problem Relation Age of Onset   Heart disease Mother    Breast cancer Mother    CAD Mother        stents emergently in her 35s   Lung cancer Father        smoker, mets to brain   Stroke Maternal Grandmother    Polycystic kidney disease Paternal Uncle    Kidney failure Paternal Uncle    Heart attack Sister 67       LAD   Anuerysm Sister      Social History   Tobacco Use   Smoking status: Never   Smokeless tobacco: Never  Substance Use Topics   Alcohol use: No      ROS Constitutional: Denies fever, chills, weight loss/gain, headaches, insomnia,  night sweats, and change in appetite. Does c/o fatigue. Eyes: Denies redness, blurred vision, diplopia, discharge, itchy, watery eyes.  ENT: Denies discharge, congestion, post nasal drip, epistaxis, sore throat, earache, hearing loss, dental pain, Tinnitus, Vertigo, Sinus pain, snoring.  Cardio: Denies chest pain, palpitations, irregular heartbeat, syncope, dyspnea, diaphoresis, orthopnea, PND, claudication, edema Respiratory: denies cough, dyspnea, DOE, pleurisy, hoarseness, laryngitis, wheezing.  Gastrointestinal: Denies dysphagia, heartburn, reflux, water brash, pain, cramps, nausea, vomiting, bloating, diarrhea, constipation, hematemesis, melena, hematochezia, jaundice, hemorrhoids Genitourinary: Denies dysuria, frequency, urgency, nocturia, hesitancy, discharge, hematuria, flank pain Breast: Breast lumps, nipple discharge, bleeding.  Musculoskeletal: Denies arthralgia, myalgia, stiffness, Jt. Swelling, pain, limp, and strain/sprain. Denies falls. Skin: Denies puritis, rash, hives, warts, acne, eczema, changing in skin lesion Neuro: No weakness, tremor, incoordination, spasms,  paresthesia, pain Psychiatric: Denies confusion, memory loss, sensory loss. Denies Depression. Endocrine: Denies change in weight, skin, hair change, nocturia, and paresthesia, diabetic polys, visual blurring, hyper / hypo glycemic episodes.  Heme/Lymph: No excessive bleeding, bruising, enlarged lymph nodes.  Physical Exam  BP 120/68  Pulse 68   Temp 97.8 F (36.6 C)   Resp 16   Ht 5' 0.5" (1.537 m)   Wt 146 lb (66.2 kg)   SpO2 98%   BMI 28.04 kg/m   General Appearance: Well nourished, well groomed and in no apparent distress.  Eyes: PERRLA, EOMs, conjunctiva no swelling or erythema, normal fundi and vessels. Sinuses: No frontal/maxillary tenderness ENT/Mouth: EACs patent / TMs  nl. Nares clear without erythema, swelling, mucoid exudates. Oral hygiene is good. No erythema, swelling, or exudate. Tongue normal, non-obstructing. Tonsils not swollen or erythematous. Hearing normal.  Neck: Supple, thyroid not palpable. No bruits, nodes or JVD. Respiratory: Respiratory effort normal.  BS equal and clear bilateral without rales, rhonci, wheezing or stridor. Cardio: Heart sounds are normal with regular rate and rhythm and no murmurs, rubs or gallops. Peripheral pulses are normal and equal bilaterally without edema. No aortic or femoral bruits. Chest: symmetric with normal excursions and percussion. Breasts: Symmetric, without lumps, nipple discharge, retractions, or fibrocystic changes.  Abdomen: Flat, soft with bowel sounds active. Nontender, no guarding, rebound, hernias, masses, or organomegaly.  Lymphatics: Non tender without lymphadenopathy.  Musculoskeletal: Full ROM all peripheral extremities, joint stability, 5/5 strength, and normal gait. Skin: Warm and dry without rashes, lesions, cyanosis, clubbing or  ecchymosis.  Neuro: Cranial nerves intact, reflexes equal bilaterally. Normal muscle tone, no cerebellar symptoms. Sensation intact.  Pysch: Alert and oriented X 3, normal affect,  Insight and Judgment appropriate.    Assessment and Plan  1. Annual Preventative Screening Examination   2. Essential hypertension  - EKG 12-Lead - Urinalysis, Routine w reflex microscopic - Microalbumin / creatinine urine ratio - CBC with Differential/Platelet - COMPLETE METABOLIC PANEL WITH GFR - Magnesium - TSH  3. Hyperlipidemia, mixed  - EKG 12-Lead - Lipid panel - TSH  4. Abnormal glucose  - EKG 12-Lead - Hemoglobin A1c - Insulin, random  5. Vitamin D deficiency  - VITAMIN D 25 Hydroxy  6. CKD (chronic kidney disease) stage 2, GFR 60-89 ml/min  - Urinalysis, Routine w reflex microscopic - Microalbumin / creatinine urine ratio - COMPLETE METABOLIC PANEL WITH GFR  7. LPRD (laryngopharyngeal reflux disease)  - CBC with Differential/Platelet  8. Screening for colorectal cancer  - Cologuard  9. Screening for ischemic heart disease  - EKG 12-Lead  10. FHx: heart disease  - EKG 12-Lead  11. Medication management  - Urinalysis, Routine w reflex microscopic - Microalbumin / creatinine urine ratio - CBC with Differential/Platelet - COMPLETE METABOLIC PANEL WITH GFR - Magnesium - Lipid panel - TSH - Hemoglobin A1c - Insulin, random - VITAMIN D 25 Hydroxy - Cologuard         Patient was counseled in prudent diet to achieve/maintain BMI less than 25 for weight control, BP monitoring, regular exercise and medications. Discussed med's effects and SE's. Screening labs and tests as requested with regular follow-up as recommended. Over 40 minutes of exam, counseling, chart review and high complex critical decision making was performed.   Kirtland Bouchard, MD

## 2023-01-16 ENCOUNTER — Ambulatory Visit (INDEPENDENT_AMBULATORY_CARE_PROVIDER_SITE_OTHER): Payer: Medicare Other | Admitting: Internal Medicine

## 2023-01-16 ENCOUNTER — Encounter: Payer: Self-pay | Admitting: Internal Medicine

## 2023-01-16 VITALS — BP 120/68 | HR 68 | Temp 97.8°F | Resp 16 | Ht 60.5 in | Wt 146.0 lb

## 2023-01-16 DIAGNOSIS — N182 Chronic kidney disease, stage 2 (mild): Secondary | ICD-10-CM

## 2023-01-16 DIAGNOSIS — I10 Essential (primary) hypertension: Secondary | ICD-10-CM | POA: Diagnosis not present

## 2023-01-16 DIAGNOSIS — Z79899 Other long term (current) drug therapy: Secondary | ICD-10-CM | POA: Diagnosis not present

## 2023-01-16 DIAGNOSIS — Z0000 Encounter for general adult medical examination without abnormal findings: Secondary | ICD-10-CM | POA: Diagnosis not present

## 2023-01-16 DIAGNOSIS — E559 Vitamin D deficiency, unspecified: Secondary | ICD-10-CM

## 2023-01-16 DIAGNOSIS — E782 Mixed hyperlipidemia: Secondary | ICD-10-CM

## 2023-01-16 DIAGNOSIS — Z8249 Family history of ischemic heart disease and other diseases of the circulatory system: Secondary | ICD-10-CM

## 2023-01-16 DIAGNOSIS — Z136 Encounter for screening for cardiovascular disorders: Secondary | ICD-10-CM

## 2023-01-16 DIAGNOSIS — K219 Gastro-esophageal reflux disease without esophagitis: Secondary | ICD-10-CM

## 2023-01-16 DIAGNOSIS — R7309 Other abnormal glucose: Secondary | ICD-10-CM

## 2023-01-16 DIAGNOSIS — Z0001 Encounter for general adult medical examination with abnormal findings: Secondary | ICD-10-CM

## 2023-01-17 LAB — COMPLETE METABOLIC PANEL WITH GFR
AG Ratio: 1.7 (calc) (ref 1.0–2.5)
ALT: 14 U/L (ref 6–29)
AST: 22 U/L (ref 10–35)
Albumin: 4.1 g/dL (ref 3.6–5.1)
Alkaline phosphatase (APISO): 70 U/L (ref 37–153)
BUN: 16 mg/dL (ref 7–25)
CO2: 27 mmol/L (ref 20–32)
Calcium: 9.3 mg/dL (ref 8.6–10.4)
Chloride: 105 mmol/L (ref 98–110)
Creat: 0.79 mg/dL (ref 0.60–1.00)
Globulin: 2.4 g/dL (calc) (ref 1.9–3.7)
Glucose, Bld: 101 mg/dL — ABNORMAL HIGH (ref 65–99)
Potassium: 4.2 mmol/L (ref 3.5–5.3)
Sodium: 139 mmol/L (ref 135–146)
Total Bilirubin: 0.5 mg/dL (ref 0.2–1.2)
Total Protein: 6.5 g/dL (ref 6.1–8.1)
eGFR: 77 mL/min/{1.73_m2} (ref >=60)

## 2023-01-17 LAB — CBC WITH DIFFERENTIAL/PLATELET
Absolute Monocytes: 530 cells/uL (ref 200–950)
Basophils Absolute: 61 cells/uL (ref 0–200)
Basophils Relative: 1.2 %
Eosinophils Absolute: 153 cells/uL (ref 15–500)
Eosinophils Relative: 3 %
HCT: 41 % (ref 35.0–45.0)
Hemoglobin: 13.9 g/dL (ref 11.7–15.5)
Lymphs Abs: 734 cells/uL — ABNORMAL LOW (ref 850–3900)
MCH: 30 pg (ref 27.0–33.0)
MCHC: 33.9 g/dL (ref 32.0–36.0)
MCV: 88.6 fL (ref 80.0–100.0)
MPV: 9 fL (ref 7.5–12.5)
Monocytes Relative: 10.4 %
Neutro Abs: 3621 cells/uL (ref 1500–7800)
Neutrophils Relative %: 71 %
Platelets: 262 10*3/uL (ref 140–400)
RBC: 4.63 10*6/uL (ref 3.80–5.10)
RDW: 12.1 % (ref 11.0–15.0)
Total Lymphocyte: 14.4 %
WBC: 5.1 10*3/uL (ref 3.8–10.8)

## 2023-01-17 LAB — HEMOGLOBIN A1C
Hgb A1c MFr Bld: 5.7 % of total Hgb — ABNORMAL HIGH (ref <5.7)
Mean Plasma Glucose: 117 mg/dL
eAG (mmol/L): 6.5 mmol/L

## 2023-01-17 LAB — LIPID PANEL
Cholesterol: 130 mg/dL (ref <200)
HDL: 71 mg/dL (ref >=50)
LDL Cholesterol (Calc): 45 mg/dL (calc)
Non-HDL Cholesterol (Calc): 59 mg/dL (calc) (ref <130)
Total CHOL/HDL Ratio: 1.8 (calc) (ref <5.0)
Triglycerides: 62 mg/dL (ref <150)

## 2023-01-17 LAB — URINALYSIS, ROUTINE W REFLEX MICROSCOPIC
Bilirubin Urine: NEGATIVE
Glucose, UA: NEGATIVE
Hgb urine dipstick: NEGATIVE
Ketones, ur: NEGATIVE
Leukocytes,Ua: NEGATIVE
Nitrite: NEGATIVE
Protein, ur: NEGATIVE
Specific Gravity, Urine: 1.01 (ref 1.001–1.035)
pH: 7 (ref 5.0–8.0)

## 2023-01-17 LAB — MICROALBUMIN / CREATININE URINE RATIO
Creatinine, Urine: 43 mg/dL (ref 20–275)
Microalb Creat Ratio: 5 mcg/mg creat (ref <30)
Microalb, Ur: 0.2 mg/dL

## 2023-01-17 LAB — MAGNESIUM: Magnesium: 1.9 mg/dL (ref 1.5–2.5)

## 2023-01-17 LAB — VITAMIN D 25 HYDROXY (VIT D DEFICIENCY, FRACTURES): Vit D, 25-Hydroxy: 86 ng/mL (ref 30–100)

## 2023-01-17 LAB — TSH: TSH: 1.89 mIU/L (ref 0.40–4.50)

## 2023-01-17 LAB — INSULIN, RANDOM: Insulin: 6 u[IU]/mL

## 2023-01-17 NOTE — Progress Notes (Signed)
<><><><><><><><><><><><><><><><><><><><><><><><><><><><><><><><><> <><><><><><><><><><><><><><><><><><><><><><><><><><><><><><><><><> -   Test results slightly outside the reference range are not unusual. If there is anything important, I will review this with you,  otherwise it is considered normal test values.  If you have further questions,  please do not hesitate to contact me at the office or via My Chart.  <><><><><><><><><><><><><><><><><><><><><><><><><><><><><><><><><> <><><><><><><><><><><><><><><><><><><><><><><><><><><><><><><><><>  -  A1c = 5.7 % - Borderline elevated glucose   - So,  - Avoid Sweets, Candy & White Stuff   - White Rice, White Potatoes, White Flour  - Breads &  Pasta <><><><><><><><><><><><><><><><><><><><><><><><><><><><><><><><><>  -  Chol = 130  and LDL = 45 - Both  Excellent   - Very low risk for Heart Attack  / Stroke <><><><><><><><><><><><><><><><><><><><><><><><><><><><><><><><><>  -  Vitamin D = 86 - Excellent - Pleas keep dose same  ! <><><><><><><><><><><><><><><><><><><><><><><><><><><><><><><><><>  -  All Else - CBC - Kidneys - Electrolytes - Liver - Magnesium & Thyroid    - all  Normal / OK <><><><><><><><><><><><><><><><><><><><><><><><><><><><><><><><><> <><><><><><><><><><><><><><><><><><><><><><><><><><><><><><><><><>  -  Keep up the [place]  Work  !  <><><><><><><><><><><><><><><><><><><><><><><><><><><><><><><><><> <><><><><><><><><><><><><><><><><><><><><><><><><><><><><><><><><>

## 2023-02-18 ENCOUNTER — Other Ambulatory Visit: Payer: Self-pay | Admitting: Nurse Practitioner

## 2023-02-18 DIAGNOSIS — K219 Gastro-esophageal reflux disease without esophagitis: Secondary | ICD-10-CM

## 2023-02-18 DIAGNOSIS — E782 Mixed hyperlipidemia: Secondary | ICD-10-CM

## 2023-03-18 ENCOUNTER — Other Ambulatory Visit: Payer: Self-pay | Admitting: Internal Medicine

## 2023-03-18 DIAGNOSIS — H8110 Benign paroxysmal vertigo, unspecified ear: Secondary | ICD-10-CM

## 2023-03-18 DIAGNOSIS — R42 Dizziness and giddiness: Secondary | ICD-10-CM

## 2023-04-03 DIAGNOSIS — H25813 Combined forms of age-related cataract, bilateral: Secondary | ICD-10-CM | POA: Diagnosis not present

## 2023-04-18 NOTE — Progress Notes (Signed)
MEDICARE ANNUAL WELLNESS VISIT AND FOLLOW UP  Assessment:    Annual Medicare Wellness Visit Due annually  Health maintenance reviewed Mammogram UTD 10/18/22- negative DEXA 11/21 T score - 2.2 osteopenia- due for repeat Cologuard 08/20/22- negative  Essential hypertension - continue medications, DASH diet, exercise and monitor at home. Call if greater than 130/80.  -     CBC with Differential/Platelet -     CMP/GFR  SVT (supraventricular tachycardia) (HCC) Had ED visits x 2, saw cardiology Controlled with atenolol  No symptoms at this time Continue cardio follow up as recommended  GERD Continue PPI, persistent cough improved, take PPI at night Next OV plan attempt to taper dose Diet/lifestyle discussed; raise HOB on blocks, avoid large/fatty meals, avoid reclining 4 hours after meals  Hyperlipidemia -continue medications, check lipids q29m, decrease fatty foods, increase activity.  - lipid panel  Abnormal glucose Recent A1Cs at goal Discussed diet/exercise, weight management  Defer A1C; check CMP  Vitamin D deficiency At goal at recent check; continue to recommend supplementation for goal of 70-100 Defer vitamin D level  Overweight- BMI Discussion about weight loss, diet, and exercise Recommended diet heavy in fruits and veggies and low in animal meats, cheeses, and dairy products, appropriate calorie intake Patient will work on decreasing saturated fats and simple carbs. Increase lean proteins and exercise Follow up at next visit   Insomnia/anxiety Trazodone 75 mg and melatonin 5 mg working well; Stress management techniques discussed, increase water, good sleep hygiene discussed, increase exercise, and increase veggies.   Family history of CAD Female family members with MI and CAD requiring stents Had normal myoview 09/2018 by Dr. Eden Emms Control blood pressure, cholesterol, glucose, increase exercise.   Osteopenia - get dexa q2y, continue Vit D and Ca, weight  bearing exercise -  Schedule DEXA with mammogram this year- ordered.  Vertigo Dexamethasone/meclizine PRN  Medication Management - magnesium    Over 30 minutes of exam, counseling, chart review, and critical decision making was performed  Future Appointments  Date Time Provider Department Center  04/19/2023 10:00 AM Raynelle Dick, NP [facility] None  07/23/2023 10:30 AM Lucky Cowboy, MD [facility] None  10/23/2023 10:30 AM Raynelle Dick, NP [facility] None  01/21/2024 10:00 AM Lucky Cowboy, MD [facility] None  04/22/2024 10:30 AM Raynelle Dick, NP [facility] None     Plan:   During the course of the visit the patient was educated and counseled about appropriate screening and preventive services including:   Pneumococcal vaccine  Influenza vaccine Td vaccine Prevnar 13 Screening electrocardiogram Screening mammography Bone densitometry screening Colorectal cancer screening Diabetes screening Glaucoma screening Nutrition counseling  Advanced directives: given info/requested copies   Subjective:   Jean Drake is a 77 y.o. female who presents for Medicare Annual Wellness Visit and 3 month follow up on hypertension, prediabetes, hyperlipidemia, vitamin D def.   Husband passed away in 2019/05/05, has occasional down moments (never full days) but does regular devotions and gratefulness/awareness and managing very well. She is prescribed trazodone 75 mg for mood/insomnia, also uses 5 mg melatonin. Sleep has been fair.  Back to church has been helpful.   She had a tick bite 4 days ago, small red place.  Denies body aches, fevers and visual changes.  Hx of migraines, improved after retiring, now with headaches that respond well to tylenol.   She has flares of vertigo, inner ear L >R for many years with intermittent flares, has meclizine and PRN dexamethasone that helps. Has been fairly  controlled recently.  She had persistent cough, now on nexium with perceived  benefit, was having breakthrough with omzeprazole OTC. She alternates allegra for allergies.   BMI is Body mass index is 28.47 kg/m., she has been working on diet and exercise doesvery active in yard, mowing several acres.  Wt Readings from Last 3 Encounters:  04/19/23 146 lb (66.2 kg)  01/16/23 146 lb (66.2 kg)  10/15/22 146 lb 9.6 oz (66.5 kg)   She has hx of episodes of SVT for which she presented to ED x2; improved with addition of atenolol.  She has family hx of CAD, underwent myoview 09/2018 by Dr. Eden Emms which was normal.   Her blood pressure has been controlled at home, currently on Atenolol 50 mg QD,today their BP is BP: 138/80  BP Readings from Last 3 Encounters:  04/19/23 138/80  01/16/23 120/68  10/15/22 (!) 142/70  She does workout. She denies chest pain, shortness of breath, dizziness.    She is on cholesterol medication (rosuvastatin 10 mg daily). Her cholesterol is not at goal. The cholesterol last visit was:   Lab Results  Component Value Date   CHOL 130 01/16/2023   HDL 71 01/16/2023   LDLCALC 45 01/16/2023   TRIG 62 01/16/2023   CHOLHDL 1.8 01/16/2023    Last A1C in the office was:  Lab Results  Component Value Date   HGBA1C 5.7 (H) 01/16/2023   CKD II monitored here. Last GFR Lab Results  Component Value Date   EGFR 77 01/16/2023    Patient is on Vitamin D supplement. Lab Results  Component Value Date   VD25OH 86 01/16/2023      Medication Review Current Outpatient Medications on File Prior to Visit  Medication Sig Dispense Refill   Ascorbic Acid (VITAMIN C PO) Take 1,000 mg by mouth daily.      aspirin 81 MG tablet Take 81 mg by mouth daily.     atenolol (TENORMIN) 50 MG tablet Take  1 tablet  Daily for BP                                                          /                                                                             Take                                     ( 50 mg )                                  by                                      mouth 90 tablet 3   benzonatate (TESSALON)  200 MG capsule Take  1 perle  3 x /day for  Cough 90 capsule 3   cetirizine (ZYRTEC) 10 MG tablet Take 10 mg by mouth daily.     Cholecalciferol (VITAMIN D) 2000 UNITS tablet Take 6,000 Units by mouth daily.      esomeprazole (NEXIUM) 40 MG capsule TAKE ONE CAPSULE BY MOUTH DAILY TO prevent acid reflux AND hoarseness 90 capsule 3   MAGNESIUM OXIDE PO Take 400 mg by mouth daily.     meclizine (ANTIVERT) 25 MG tablet TAKE 1/2-1 TABLET BY MOUTH 2-3 TIMES DAILY AS NEEDED FOR dizziness/vertigo 270 tablet 3   Melatonin 5 MG TABS Take by mouth. Patient takes 3 tablets to equal 15 mg at bedtime.     Multiple Vitamin (MULTIVITAMIN) tablet Take 1 tablet by mouth daily.     multivitamin-lutein (OCUVITE-LUTEIN) CAPS capsule Take 1 capsule by mouth daily.     Omega-3 Fatty Acids (FISH OIL) 1000 MG CAPS Take 1 capsule by mouth daily.     OVER THE COUNTER MEDICATION Takes Vitamin B Complex 1 daily     rosuvastatin (CRESTOR) 10 MG tablet TAKE ONE TABLET BY MOUTH DAILY FOR cholesterol 90 tablet 3   traZODone (DESYREL) 50 MG tablet take 1/2-2 tablets by mouth at bedtime as needed for sleep 180 tablet 3   TURMERIC PO Take 1,500 mg by mouth.     Zinc 50 MG TABS Take 50 mg by mouth daily.     No current facility-administered medications on file prior to visit.    Current Problems (verified) Patient Active Problem List   Diagnosis Date Noted   COVID-19 (09/13/2021) 09/14/2021   Vertigo 09/05/2021   Cough present for greater than 3 weeks 03/20/2021   CKD (chronic kidney disease) stage 2, GFR 60-89 ml/min 12/11/2020   Osteopenia 09/05/2018   FHx: heart disease 06/18/2018   Insomnia 12/06/2017   SVT (supraventricular tachycardia) 11/15/2015   Overweight (BMI 25.0-29.9) 11/18/2014   Medication management 04/20/2014   Essential hypertension 10/20/2013   Hyperlipidemia, mixed 10/20/2013   Abnormal glucose 10/20/2013   Vitamin D deficiency 10/20/2013    GERD 10/20/2013    Screening Tests Immunization History  Administered Date(s) Administered   DTaP 09/12/2013   Influenza Split 09/12/2013   Influenza, High Dose Seasonal PF 10/04/2014, 08/30/2017   Pneumococcal Conjugate-13 10/04/2014   Pneumococcal Polysaccharide-23 03/29/2009   Td 12/20/2003   Tdap 09/12/2013   Zoster, Live 06/07/2010   Health Maintenance  Topic Date Due   COVID-19 Vaccine (1) 05/05/2023 (Originally 05/25/1951)   Zoster Vaccines- Shingrix (1 of 2) 07/20/2023 (Originally 05/24/1965)   Pneumonia Vaccine 59+ Years old (3 of 3 - PPSV23 or PCV20) 10/16/2023 (Originally 10/05/2019)   INFLUENZA VACCINE  06/13/2023   DTaP/Tdap/Td (4 - Td or Tdap) 09/13/2023   Medicare Annual Wellness (AWV)  04/18/2024   DEXA SCAN  Completed   Hepatitis C Screening  Completed   HPV VACCINES  Aged Out   Colonoscopy  Discontinued   Fecal DNA (Cologuard)  Discontinued     Names of Other Physician/Practitioners you currently use: 1. [facility]- here for primary care 2. Dr. Dione Booze , eye doctor, last visit 03/2023 3. Dr. Irving Copas, dentist, last visit 2023 goes q100m  Patient Care Team: Lucky Cowboy, MD as PCP - General (Internal Medicine) Wendall Stade, MD as PCP - Cardiology (Cardiology) Louis Meckel, MD (Inactive) as Consulting Physician (Gastroenterology) Tracey Harries, MD as Consulting Physician (Obstetrics and Gynecology)  Allergies Allergies  Allergen Reactions   Penicillins Anaphylaxis   Codeine Nausea And Vomiting   Sudafed [Pseudoephedrine Hcl]     Makes heart race, per pt     SURGICAL HISTORY She  has a past surgical history that includes No past surgeries. FAMILY HISTORY Her family history includes Anuerysm in her sister; Breast cancer in her mother; CAD in her mother; Heart attack (age of onset: 42) in her sister; Heart disease in her mother; Kidney failure in her paternal uncle; Lung cancer in her father; Polycystic kidney  disease in her paternal uncle; Stroke in her maternal grandmother. SOCIAL HISTORY She  reports that she has never smoked. She has never used smokeless tobacco. She reports that she does not drink alcohol.  MEDICARE WELLNESS OBJECTIVES: Physical activity: Current Exercise Habits: The patient does not participate in regular exercise at present, Exercise limited by: None identified Cardiac risk factors: Cardiac Risk Factors include: advanced age (>56men, >93 women);dyslipidemia;hypertension;sedentary lifestyle Depression/mood screen:      04/19/2023    9:50 AM  Depression screen PHQ 2/9  Decreased Interest 0  Down, Depressed, Hopeless 0  PHQ - 2 Score 0    ADLs:     04/19/2023    9:50 AM 10/15/2022   10:38 AM  In your present state of health, do you have any difficulty performing the following activities:  Hearing? 0 0  Vision? 0 0  Difficulty concentrating or making decisions? 0 0  Walking or climbing stairs? 0 0  Dressing or bathing? 0 0  Doing errands, shopping? 0 0    Cognitive Testing  Alert? Yes  Normal Appearance?Yes  Oriented to person? Yes  Place? Yes   Time? Yes  Recall of three objects?  Yes  Can perform simple calculations? Yes  Displays appropriate judgment?Yes  Can read the correct time from a watch face?Yes  EOL planning: Does Patient Have a Medical Advance Directive?: Yes Type of Advance Directive: Healthcare Power of Attorney, Living will Does patient want to make changes to medical advance directive?: No - Patient declined Copy of Healthcare Power of Attorney in Chart?: No - copy requested   Objective:   Today's Vitals   04/19/23 0937  BP: 138/80  Pulse: 83  Resp: 17  Temp: 97.9 F (36.6 C)  SpO2: 96%  Weight: 146 lb (66.2 kg)  Height: 5' 0.05" (1.525 m)      Body mass index is 28.47 kg/m.  General appearance: alert, no distress, WD/WN,  female HEENT: normocephalic, sclerae anicteric, TMs pearly, nares patent, no discharge or erythema, pharynx  normal Oral cavity: MMM, no lesions Neck: supple, no lymphadenopathy, no thyromegaly, no masses Heart: RRR, normal S1, S2, no murmurs Lungs: CTA bilaterally, no wheezes, rhonchi, or rales Abdomen: +bs, soft, non tender, + distended/full lower AB, no masses, no hepatomegaly, no splenomegaly Musculoskeletal: nontender, no swelling, no obvious deformity Extremities: no edema, no cyanosis, no clubbing Pulses: 2+ symmetric, upper and lower extremities, normal cap refill Neurological: alert, oriented x 3, CN2-12 intact, strength normal upper extremities and lower extremities, sensation normal throughout, DTRs 2+ throughout, no cerebellar signs, gait normal Psychiatric: normal affect, behavior normal, pleasant   Medicare Attestation I have personally reviewed: The patient's medical and social history Their use of alcohol, tobacco or illicit drugs Their current medications and supplements The patient's functional ability including ADLs,fall risks, home safety risks, cognitive, and hearing and visual impairment Diet and physical activities Evidence for depression or mood disorders  The patient's weight, height, BMI, and visual acuity have been  recorded in the chart.  I have made referrals, counseling, and provided education to the patient based on review of the above and I have provided the patient with a written personalized care plan for preventive services.     Raynelle Dick, NP   04/19/2023

## 2023-04-19 ENCOUNTER — Encounter: Payer: Self-pay | Admitting: Nurse Practitioner

## 2023-04-19 ENCOUNTER — Ambulatory Visit (INDEPENDENT_AMBULATORY_CARE_PROVIDER_SITE_OTHER): Payer: Medicare Other | Admitting: Nurse Practitioner

## 2023-04-19 VITALS — BP 138/80 | HR 83 | Temp 97.9°F | Resp 17 | Ht 60.05 in | Wt 146.0 lb

## 2023-04-19 DIAGNOSIS — E782 Mixed hyperlipidemia: Secondary | ICD-10-CM | POA: Diagnosis not present

## 2023-04-19 DIAGNOSIS — I471 Supraventricular tachycardia, unspecified: Secondary | ICD-10-CM

## 2023-04-19 DIAGNOSIS — R6889 Other general symptoms and signs: Secondary | ICD-10-CM

## 2023-04-19 DIAGNOSIS — K2100 Gastro-esophageal reflux disease with esophagitis, without bleeding: Secondary | ICD-10-CM | POA: Diagnosis not present

## 2023-04-19 DIAGNOSIS — Z79899 Other long term (current) drug therapy: Secondary | ICD-10-CM

## 2023-04-19 DIAGNOSIS — Z0000 Encounter for general adult medical examination without abnormal findings: Secondary | ICD-10-CM

## 2023-04-19 DIAGNOSIS — E559 Vitamin D deficiency, unspecified: Secondary | ICD-10-CM

## 2023-04-19 DIAGNOSIS — Z0001 Encounter for general adult medical examination with abnormal findings: Secondary | ICD-10-CM

## 2023-04-19 DIAGNOSIS — I10 Essential (primary) hypertension: Secondary | ICD-10-CM

## 2023-04-19 DIAGNOSIS — F5101 Primary insomnia: Secondary | ICD-10-CM

## 2023-04-19 DIAGNOSIS — R7309 Other abnormal glucose: Secondary | ICD-10-CM | POA: Diagnosis not present

## 2023-04-19 DIAGNOSIS — Z8249 Family history of ischemic heart disease and other diseases of the circulatory system: Secondary | ICD-10-CM

## 2023-04-19 DIAGNOSIS — M8588 Other specified disorders of bone density and structure, other site: Secondary | ICD-10-CM

## 2023-04-19 DIAGNOSIS — E663 Overweight: Secondary | ICD-10-CM

## 2023-04-19 LAB — CBC WITH DIFFERENTIAL/PLATELET
Basophils Absolute: 61 cells/uL (ref 0–200)
Eosinophils Absolute: 240 cells/uL (ref 15–500)
Eosinophils Relative: 4.7 %
HCT: 41.7 % (ref 35.0–45.0)
Hemoglobin: 13.9 g/dL (ref 11.7–15.5)
MPV: 9.2 fL (ref 7.5–12.5)
Neutrophils Relative %: 67.6 %
RBC: 4.63 10*6/uL (ref 3.80–5.10)
RDW: 11.9 % (ref 11.0–15.0)

## 2023-04-19 NOTE — Patient Instructions (Signed)

## 2023-04-20 LAB — COMPLETE METABOLIC PANEL WITH GFR
AG Ratio: 2 (calc) (ref 1.0–2.5)
ALT: 18 U/L (ref 6–29)
AST: 23 U/L (ref 10–35)
Albumin: 4.3 g/dL (ref 3.6–5.1)
Alkaline phosphatase (APISO): 78 U/L (ref 37–153)
BUN: 17 mg/dL (ref 7–25)
CO2: 27 mmol/L (ref 20–32)
Calcium: 9.5 mg/dL (ref 8.6–10.4)
Chloride: 103 mmol/L (ref 98–110)
Creat: 0.85 mg/dL (ref 0.60–1.00)
Globulin: 2.2 g/dL (calc) (ref 1.9–3.7)
Glucose, Bld: 103 mg/dL — ABNORMAL HIGH (ref 65–99)
Potassium: 4.6 mmol/L (ref 3.5–5.3)
Sodium: 137 mmol/L (ref 135–146)
Total Bilirubin: 0.5 mg/dL (ref 0.2–1.2)
Total Protein: 6.5 g/dL (ref 6.1–8.1)
eGFR: 71 mL/min/{1.73_m2} (ref >=60)

## 2023-04-20 LAB — LIPID PANEL
Cholesterol: 147 mg/dL (ref <200)
HDL: 75 mg/dL (ref >=50)
LDL Cholesterol (Calc): 57 mg/dL (calc)
Non-HDL Cholesterol (Calc): 72 mg/dL (calc) (ref <130)
Total CHOL/HDL Ratio: 2 (calc) (ref <5.0)
Triglycerides: 70 mg/dL (ref <150)

## 2023-04-20 LAB — CBC WITH DIFFERENTIAL/PLATELET
Absolute Monocytes: 561 cells/uL (ref 200–950)
Basophils Relative: 1.2 %
Lymphs Abs: 791 cells/uL — ABNORMAL LOW (ref 850–3900)
MCH: 30 pg (ref 27.0–33.0)
MCHC: 33.3 g/dL (ref 32.0–36.0)
MCV: 90.1 fL (ref 80.0–100.0)
Monocytes Relative: 11 %
Neutro Abs: 3448 cells/uL (ref 1500–7800)
Platelets: 271 10*3/uL (ref 140–400)
Total Lymphocyte: 15.5 %
WBC: 5.1 10*3/uL (ref 3.8–10.8)

## 2023-04-20 LAB — MAGNESIUM: Magnesium: 1.9 mg/dL (ref 1.5–2.5)

## 2023-06-05 DIAGNOSIS — K080 Exfoliation of teeth due to systemic causes: Secondary | ICD-10-CM | POA: Diagnosis not present

## 2023-06-26 ENCOUNTER — Other Ambulatory Visit: Payer: Self-pay | Admitting: Nurse Practitioner

## 2023-07-08 ENCOUNTER — Other Ambulatory Visit: Payer: Self-pay | Admitting: Internal Medicine

## 2023-07-09 ENCOUNTER — Other Ambulatory Visit: Payer: Self-pay | Admitting: Nurse Practitioner

## 2023-07-09 DIAGNOSIS — Z1231 Encounter for screening mammogram for malignant neoplasm of breast: Secondary | ICD-10-CM

## 2023-07-23 ENCOUNTER — Encounter: Payer: Self-pay | Admitting: Internal Medicine

## 2023-07-23 ENCOUNTER — Ambulatory Visit (INDEPENDENT_AMBULATORY_CARE_PROVIDER_SITE_OTHER): Payer: Medicare Other | Admitting: Internal Medicine

## 2023-07-23 VITALS — BP 122/62 | HR 67 | Temp 97.7°F | Resp 16 | Ht 60.5 in | Wt 146.4 lb

## 2023-07-23 DIAGNOSIS — I10 Essential (primary) hypertension: Secondary | ICD-10-CM

## 2023-07-23 DIAGNOSIS — R7309 Other abnormal glucose: Secondary | ICD-10-CM | POA: Diagnosis not present

## 2023-07-23 DIAGNOSIS — Z79899 Other long term (current) drug therapy: Secondary | ICD-10-CM

## 2023-07-23 DIAGNOSIS — E559 Vitamin D deficiency, unspecified: Secondary | ICD-10-CM | POA: Diagnosis not present

## 2023-07-23 DIAGNOSIS — E782 Mixed hyperlipidemia: Secondary | ICD-10-CM

## 2023-07-23 NOTE — Progress Notes (Signed)
Future Appointments  Date Time Provider Department  07/23/2023                             6 mo ov 10:30 AM Lucky Cowboy, MD [facility]  10/23/2023                            9 mo ov 10:30 AM Raynelle Dick, NP [facility]  01/21/2024                            cpe 10:00 AM Lucky Cowboy, MD [facility]  04/22/2024                            wellness  10:30 AM Raynelle Dick, NP [facility]    History of Present Illness:      This very nice 77 y.o. [name] presents for 6 month follow up with HTN, HLD, Pre-Diabetes and Vitamin D Deficiency.         Patient is treated for HTN (2012) & BP has been controlled at home.  Today's BP is at goal - 122/62 . Patient has hx/o pSVT x 2  (2012 & 2017) requiring Adenosine CV in ER.  She had a Negative Myoview 2019. Patient has had no complaints of any cardiac type chest pain, palpitations, dyspnea Jean Drake /PND, dizziness, claudication or dependent edema.        Hyperlipidemia is controlled with diet & meds. Patient denies myalgias or other med SE's. Last Lipids were at goal:  Lab Results  Component Value Date   CHOL 147 04/19/2023   HDL 75 04/19/2023   LDLCALC 57 04/19/2023   TRIG 70 04/19/2023   CHOLHDL 2.0 04/19/2023     Also, the patient has has been followed for prediabetes  ( A1c 5.7% / Mar 2024) and has had no symptoms of reactive hypoglycemia, diabetic polys, paresthesias or visual blurring.  Last A1c was near  goal:  Lab Results  Component Value Date   HGBA1C 5.7 (H) 01/16/2023                                                       Further, the patient also has history of Vitamin D Deficiency ("9" /2009) and supplements vitamin D without any suspected side-effects. Last vitamin D was at goal:  Lab Results  Component Value Date   VD25OH 86 01/16/2023       Current Outpatient Medications on File Prior to Visit  Medication Sig   VITAMIN C  1,000 mg Take  daily.    aspirin 81 MG tablet Take daily.   atenolol  (TENORMIN) 50 MG tablet TAKE ONE TABLET  EVERY DAY    benzonatate  100 MG capsule Take 1-2 capsules 3 times daily as needed for cough    VITAMIN D 2000 UNITS tablet Take 6,000 Units  daily.    fexofenadine 180 MG tablet Take daily.   MAGNESIUM OXIDE  400 mg  Take daily.   meclizine  25 MG tablet TAKE 1/2-1 TABLET 2-3 TIMES DAILY AS NEEDE   Melatonin 5 MG TABS Patient takes 3 tablets to equal 15 mg at  bedtime.   Multiple Vitamin Take 1 tablet daily.   OCUVITE-LUTEIN Take 1 capsule daily.   Omega-3  FISH OIL 1000 MG CAPS Take 1 capsule daily.   Vitamin B Complex  Takes 1 daily   rosuvastatin (CRESTOR) 10 MG tablet TAKE ONE TABLET DAILY    traZODone  50 MG tablet take 1/2-2 tablets at bedtime as needed   TURMERIC Take    Zinc 50 MG TABS Take 100 mg  daily.     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   GERD 10/20/2013   Hyperlipidemia 10/20/2013   Migraine, unspecified, without mention of intractable migraine without mention of status migrainosus 10/20/2013   Morbid obesity (HCC) 11/18/2014   Prediabetes 10/20/2013   SVT (supraventricular tachycardia) (HCC) 11/15/2015   Vitamin D deficiency 10/20/2013     Immunization History  Administered Date(s) Administered   DTaP 09/12/2013   Influenza Split 09/12/2013   Influenza, High Dose  10/04/2014, 08/30/2017   Pneumococcal  -  13 10/04/2014   Pneumococcal -  23 03/29/2009   Td 12/20/2003   Tdap 09/12/2013   Zoster, Live 06/07/2010     Past Surgical History:  Procedure Laterality Date   NO PAST SURGERIES      FHx:    Reviewed / unchanged  SHx:    Reviewed / unchanged   Systems Review:   Constitutional: Denies fever, chills, wt changes, headaches, insomnia, fatigue, night sweats, change in  appetite. Eyes: Denies redness, blurred vision, diplopia, discharge, itchy, watery eyes.  ENT: Denies discharge, congestion, post nasal drip, epistaxis, sore throat, earache, hearing loss, dental pain, tinnitus, vertigo, sinus pain, snoring.  CV: Denies chest pain, palpitations, irregular heartbeat, syncope, dyspnea, diaphoresis, orthopnea, PND, claudication or edema. Respiratory: denies cough, dyspnea, DOE, pleurisy, hoarseness, laryngitis, wheezing.  Gastrointestinal: Denies dysphagia, odynophagia, heartburn, reflux, water brash, abdominal pain or cramps, nausea, vomiting, bloating, diarrhea, constipation, hematemesis, melena, hematochezia  or hemorrhoids. Genitourinary: Denies dysuria, frequency, urgency, nocturia, hesitancy, discharge, hematuria or flank pain. Musculoskeletal: Denies arthralgias, myalgias, stiffness, jt. swelling, pain, limping or strain/sprain.  Skin: Denies pruritus, rash, hives, warts, acne, eczema or change in skin lesion(s). Neuro: No weakness, tremor, incoordination, spasms, paresthesia or pain. Psychiatric: Denies confusion, memory loss or sensory loss. Endo: Denies change in weight, skin or hair change.  Heme/Lymph: No excessive bleeding, bruising or enlarged lymph nodes.  Physical Exam  BP 122/62   Pulse 67   Temp 97.7 F (36.5 C)   Resp 16   Ht 5' 0.5" (1.537 m)   Wt 146 lb 6.4 oz (66.4 kg)   SpO2 95%   BMI 28.12 kg/m   Appears  well nourished, well groomed  and in no distress.  Eyes: PERRLA, EOMs, conjunctiva no swelling or erythema. Sinuses: No frontal/maxillary tenderness ENT/Mouth: EAC's clear, TM's nl w/o erythema, bulging. Nares clear w/o erythema, swelling, exudates. Oropharynx clear without erythema or exudates. Oral hygiene is good. Tongue normal, non obstructing. Hearing intact.  Neck: Supple. Thyroid not palpable. Car 2+/2+ without bruits, nodes or JVD. Chest: Respirations nl with BS clear & equal w/o rales, rhonchi, wheezing or stridor.   Cor: Heart sounds normal w/ regular rate and rhythm without sig. murmurs, gallops, clicks or rubs. Peripheral pulses normal and equal  without  edema.  Abdomen: Soft & bowel sounds normal. Non-tender w/o guarding, rebound, hernias, masses or organomegaly.  Lymphatics: Unremarkable.  Musculoskeletal: Full ROM all peripheral extremities, joint stability, 5/5 strength and normal gait.  Skin: Warm, dry without exposed rashes, lesions or ecchymosis apparent.  Neuro: Cranial nerves intact, reflexes equal bilaterally. Sensory-motor testing grossly intact. Tendon reflexes grossly intact.  Pysch: Alert & oriented x 3.  Insight and judgement nl & appropriate. No ideations.  Assessment and Plan:   1. Essential hypertension   - Continue medication, monitor blood pressure at home.  - Continue DASH diet.  Reminder to go to the ER if any CP,  SOB, nausea, dizziness, severe HA, changes vision/speech.    - CBC with Differential/Platelet - COMPLETE METABOLIC PANEL WITH GFR - Magnesium - TSH   2. Hyperlipidemia, mixed   - Continue diet/meds, exercise,& lifestyle modifications.  - Continue monitor periodic cholesterol/liver & renal functions     - Lipid panel - TSH   3. Abnormal glucose  - Continue diet, exercise  - Lifestyle modifications.  - Monitor appropriate labs   - VITAMIN D 25 Hydroxy    4. Vitamin D deficiency  - Continue supplementation.  - Hemoglobin A1c - Insulin, random   5. Medication management  - CBC with Differential/Platelet - COMPLETE METABOLIC PANEL WITH GFR - Magnesium - Lipid panel - TSH - Hemoglobin A1c - Insulin, random - VITAMIN D 25 Hydroxy          Discussed  regular exercise, BP monitoring, weight control to achieve/maintain BMI less than 25 and discussed med and SE's. Recommended labs to assess and monitor clinical status with further disposition pending results of labs.  I discussed the assessment and treatment plan with the patient. The patient  was provided an opportunity to ask questions and all were answered. The patient agreed with the plan and demonstrated an understanding of the instructions.  I provided over 30 minutes of exam, counseling, chart review and  complex critical decision making.        The patient was advised to call back or seek an in-person evaluation if the symptoms worsen or if the condition fails to improve as anticipated.   Jean Maw, MD

## 2023-07-23 NOTE — Patient Instructions (Signed)

## 2023-07-24 LAB — MAGNESIUM: Magnesium: 2 mg/dL (ref 1.5–2.5)

## 2023-07-24 LAB — COMPLETE METABOLIC PANEL WITH GFR
AG Ratio: 2 (calc) (ref 1.0–2.5)
ALT: 14 U/L (ref 6–29)
AST: 23 U/L (ref 10–35)
Albumin: 4.3 g/dL (ref 3.6–5.1)
Alkaline phosphatase (APISO): 74 U/L (ref 37–153)
BUN: 14 mg/dL (ref 7–25)
CO2: 26 mmol/L (ref 20–32)
Calcium: 9.4 mg/dL (ref 8.6–10.4)
Chloride: 103 mmol/L (ref 98–110)
Creat: 0.82 mg/dL (ref 0.60–1.00)
Globulin: 2.2 g/dL (ref 1.9–3.7)
Glucose, Bld: 101 mg/dL — ABNORMAL HIGH (ref 65–99)
Potassium: 4.2 mmol/L (ref 3.5–5.3)
Sodium: 137 mmol/L (ref 135–146)
Total Bilirubin: 0.5 mg/dL (ref 0.2–1.2)
Total Protein: 6.5 g/dL (ref 6.1–8.1)
eGFR: 74 mL/min/{1.73_m2} (ref >=60)

## 2023-07-24 LAB — HEMOGLOBIN A1C
Hgb A1c MFr Bld: 5.6 %{Hb} (ref <5.7)
Mean Plasma Glucose: 114 mg/dL
eAG (mmol/L): 6.3 mmol/L

## 2023-07-24 LAB — CBC WITH DIFFERENTIAL/PLATELET
Absolute Monocytes: 603 {cells}/uL (ref 200–950)
Basophils Absolute: 47 {cells}/uL (ref 0–200)
Basophils Relative: 0.7 %
Eosinophils Absolute: 80 {cells}/uL (ref 15–500)
Eosinophils Relative: 1.2 %
HCT: 41 % (ref 35.0–45.0)
Hemoglobin: 13.7 g/dL (ref 11.7–15.5)
Lymphs Abs: 764 {cells}/uL — ABNORMAL LOW (ref 850–3900)
MCH: 29.7 pg (ref 27.0–33.0)
MCHC: 33.4 g/dL (ref 32.0–36.0)
MCV: 88.7 fL (ref 80.0–100.0)
MPV: 9.3 fL (ref 7.5–12.5)
Monocytes Relative: 9 %
Neutro Abs: 5206 {cells}/uL (ref 1500–7800)
Neutrophils Relative %: 77.7 %
Platelets: 269 10*3/uL (ref 140–400)
RBC: 4.62 10*6/uL (ref 3.80–5.10)
RDW: 12.3 % (ref 11.0–15.0)
Total Lymphocyte: 11.4 %
WBC: 6.7 10*3/uL (ref 3.8–10.8)

## 2023-07-24 LAB — INSULIN, RANDOM: Insulin: 7.4 u[IU]/mL

## 2023-07-24 LAB — TSH: TSH: 1.82 m[IU]/L (ref 0.40–4.50)

## 2023-07-24 LAB — LIPID PANEL
Cholesterol: 136 mg/dL (ref <200)
HDL: 67 mg/dL (ref >=50)
LDL Cholesterol (Calc): 54 mg/dL
Non-HDL Cholesterol (Calc): 69 mg/dL (ref <130)
Total CHOL/HDL Ratio: 2 (calc) (ref <5.0)
Triglycerides: 74 mg/dL (ref <150)

## 2023-07-24 LAB — VITAMIN D 25 HYDROXY (VIT D DEFICIENCY, FRACTURES): Vit D, 25-Hydroxy: 78 ng/mL (ref 30–100)

## 2023-07-24 NOTE — Progress Notes (Signed)
<>*<>*<>*<>*<>*<>*<>*<>*<>*<>*<>*<>*<>*<>*<>*<>*<>*<>*<>*<>*<>*<>*<>*<>*<> <>*<>*<>*<>*<>*<>*<>*<>*<>*<>*<>*<>*<>*<>*<>*<>*<>*<>*<>*<>*<>*<>*<>*<>*<>  -  Test results slightly outside the reference range are not unusual. If there is anything important, I will review this with you,  otherwise it is considered normal test values.  If you have further questions,  please do not hesitate to contact me at the office or via My Chart.   <>*<>*<>*<>*<>*<>*<>*<>*<>*<>*<>*<>*<>*<>*<>*<>*<>*<>*<>*<>*<>*<>*<>*<>*<> <>*<>*<>*<>*<>*<>*<>*<>*<>*<>*<>*<>*<>*<>*<>*<>*<>*<>*<>*<>*<>*<>*<>*<>*<>  -  Chol = 136   &   LDL 54    - Both    Excellent   - Very low risk for Heart Attack  / Stroke  <>*<>*<>*<>*<>*<>*<>*<>*<>*<>*<>*<>*<>*<>*<>*<>*<>*<>*<>*<>*<>*<>*<>*<>*<> <>*<>*<>*<>*<>*<>*<>*<>*<>*<>*<>*<>*<>*<>*<>*<>*<>*<>*<>*<>*<>*<>*<>*<>*<>  - A1c - Normal - No Diabetes  - Great  !   <>*<>*<>*<>*<>*<>*<>*<>*<>*<>*<>*<>*<>*<>*<>*<>*<>*<>*<>*<>*<>*<>*<>*<>*<> <>*<>*<>*<>*<>*<>*<>*<>*<>*<>*<>*<>*<>*<>*<>*<>*<>*<>*<>*<>*<>*<>*<>*<>*<>  - Vitamin D = 78  - Excellent   <>*<>*<>*<>*<>*<>*<>*<>*<>*<>*<>*<>*<>*<>*<>*<>*<>*<>*<>*<>*<>*<>*<>*<>*<> <>*<>*<>*<>*<>*<>*<>*<>*<>*<>*<>*<>*<>*<>*<>*<>*<>*<>*<>*<>*<>*<>*<>*<>*<>  - All Else - CBC - Kidneys - Electrolytes - Liver - Magnesium & Thyroid    - all  Normal / OK  <>*<>*<>*<>*<>*<>*<>*<>*<>*<>*<>*<>*<>*<>*<>*<>*<>*<>*<>*<>*<>*<>*<>*<>*<> <>*<>*<>*<>*<>*<>*<>*<>*<>*<>*<>*<>*<>*<>*<>*<>*<>*<>*<>*<>*<>*<>*<>*<>*<>

## 2023-07-27 ENCOUNTER — Encounter: Payer: Self-pay | Admitting: Internal Medicine

## 2023-09-19 ENCOUNTER — Encounter: Payer: Self-pay | Admitting: Nurse Practitioner

## 2023-09-19 ENCOUNTER — Ambulatory Visit: Payer: Medicare Other | Admitting: Nurse Practitioner

## 2023-09-19 ENCOUNTER — Other Ambulatory Visit: Payer: Self-pay

## 2023-09-19 VITALS — BP 126/70 | HR 86 | Temp 98.1°F | Ht 60.5 in | Wt 142.2 lb

## 2023-09-19 DIAGNOSIS — Z1152 Encounter for screening for COVID-19: Secondary | ICD-10-CM

## 2023-09-19 DIAGNOSIS — U071 COVID-19: Secondary | ICD-10-CM | POA: Diagnosis not present

## 2023-09-19 DIAGNOSIS — R6889 Other general symptoms and signs: Secondary | ICD-10-CM

## 2023-09-19 LAB — POC COVID19 BINAXNOW: SARS Coronavirus 2 Ag: POSITIVE — AB

## 2023-09-19 LAB — POCT INFLUENZA A/B
Influenza A, POC: NEGATIVE
Influenza B, POC: NEGATIVE

## 2023-09-19 MED ORDER — PREDNISONE 10 MG PO TABS
ORAL_TABLET | ORAL | 0 refills | Status: DC
Start: 2023-09-19 — End: 2023-10-23

## 2023-09-19 MED ORDER — AZITHROMYCIN 250 MG PO TABS: ORAL_TABLET | ORAL | 1 refills | Status: DC

## 2023-09-19 MED ORDER — PROMETHAZINE-DM 6.25-15 MG/5ML PO SYRP
2.5000 mL | ORAL_SOLUTION | Freq: Four times a day (QID) | ORAL | 0 refills | Status: DC | PRN
Start: 2023-09-19 — End: 2023-10-23

## 2023-09-19 NOTE — Progress Notes (Signed)
THIS ENCOUNTER IS A VIRTUAL VISIT DUE TO COVID-19 - PATIENT WAS NOT SEEN IN THE OFFICE.  PATIENT HAS CONSENTED TO VIRTUAL VISIT / TELEMEDICINE VISIT   Virtual Visit via telephone Note  I connected with  Jean Drake on 09/19/2023 by telephone.  I verified that I am speaking with the correct person using two identifiers.    I discussed the limitations of evaluation and management by telemedicine and the availability of in person appointments. The patient expressed understanding and agreed to proceed.  History of Present Illness:  BP 126/70   Pulse 86   Temp 98.1 F (36.7 C)   Ht 5' 0.5" (1.537 m)   Wt 142 lb 3.2 oz (64.5 kg)   SpO2 98%   BMI 27.31 kg/m  77 y.o. patient contacted office reporting URI sx cough, fever, congestion, ear fullness. she tested positive by in office test. OV was conducted by telephone to minimize exposure. This patient has vaccinated for covid 19.  Sx began 2 days ago with coughing and sore throat  Treatments tried so far: Tylenol   Exposures: Social gathering   Medications   Current Outpatient Medications (Cardiovascular):    atenolol (TENORMIN) 50 MG tablet, TAKE ONE TABLET BY MOUTH EVERY DAY FOR BLOOD PRESSURE   rosuvastatin (CRESTOR) 10 MG tablet, TAKE ONE TABLET BY MOUTH DAILY FOR cholesterol  Current Outpatient Medications (Respiratory):    benzonatate (TESSALON) 200 MG capsule, Take  1 perle  3 x /day for  Cough   fexofenadine (ALLEGRA ALLERGY) 180 MG tablet, Take 180 mg by mouth daily.  Current Outpatient Medications (Analgesics):    aspirin 81 MG tablet, Take 81 mg by mouth daily.   Current Outpatient Medications (Other):    Ascorbic Acid (VITAMIN C PO), Take 1,000 mg by mouth daily.    Cholecalciferol (VITAMIN D) 2000 UNITS tablet, Take 6,000 Units by mouth daily.    esomeprazole (NEXIUM) 40 MG capsule, TAKE ONE CAPSULE BY MOUTH DAILY TO prevent acid reflux AND hoarseness   MAGNESIUM OXIDE PO*, Take 400 mg by mouth daily.   meclizine  (ANTIVERT) 25 MG tablet, TAKE 1/2-1 TABLET BY MOUTH 2-3 TIMES DAILY AS NEEDED FOR dizziness/vertigo   Melatonin 5 MG TABS, Take by mouth. Patient takes 3 tablets to equal 15 mg at bedtime.   Multiple Vitamin (MULTIVITAMIN) tablet, Take 1 tablet by mouth daily.   multivitamin-lutein (OCUVITE-LUTEIN) CAPS capsule, Take 1 capsule by mouth daily.   Omega-3 Fatty Acids (FISH OIL) 1000 MG CAPS, Take 1 capsule by mouth daily.   OVER THE COUNTER MEDICATION, Takes Vitamin B Complex 1 daily   traZODone (DESYREL) 50 MG tablet, TAKE 1/2-2 TABLETS BY MOUTH AT BEDTIME AS NEEDED FOR SLEEP   TURMERIC PO, Take 1,500 mg by mouth.   Zinc 50 MG TABS, Take 50 mg by mouth daily. * These medications belong to multiple therapeutic classes and are listed under each applicable group.  Allergies:  Allergies  Allergen Reactions   Penicillins Anaphylaxis   Codeine Nausea And Vomiting   Sudafed [Pseudoephedrine Hcl]     Makes heart race, per pt     Social History:   reports that she has never smoked. She has never used smokeless tobacco. She reports that she does not drink alcohol. No history on file for drug use.  Observations/Objective:  General : Well sounding patient in no apparent distress HEENT: no hoarseness, no cough for duration of visit Lungs: speaks in complete sentences, no audible wheezing, no apparent distress Neurological: alert, oriented  x 3 Psychiatric: pleasant, judgement appropriate   Assessment and Plan:  Covid 19 Covid 19 positive per rapid screening test in office Risk factors include: She has Essential hypertension; Hyperlipidemia, mixed; Abnormal glucose; Vitamin D deficiency; GERD; Medication management; Overweight (BMI 25.0-29.9); SVT (supraventricular tachycardia) (HCC); Insomnia; FHx: heart disease; Osteopenia; CKD (chronic kidney disease) stage 2, GFR 60-89 ml/min; Cough present for greater than 3 weeks; Vertigo; and COVID-19 (09/13/2021) on their problem list. Symptoms are:  mild Due to co morbid conditions and risk factors, discussed antivirals  Immue support reviewed Vitamin C, Vitamin D, Zinc Take tylenol PRN temp 101+ Push hydration Regular ambulation or calf exercises exercises for clot prevention and 81 mg ASA unless contraindicated Sx supportive therapy suggested Follow up via mychart or telephone if needed Advised patient obtain O2 monitor; present to ED if persistently <90% or with severe dyspnea, CP, fever uncontrolled by tylenol, confusion, sudden decline       Should remain in isolation 5 days from testing positive and then wear a mask when around other people for the following 5 days  Flu-like symptoms Negative  - POCT Influenza A/B  Encounter for screening for COVID-19 Positive   - POC COVID-19  COVID-19 Start tmt with Promethazine as directed Start steroid taper as directed Start Z-Pak as directed  - promethazine-dextromethorphan (PROMETHAZINE-DM) 6.25-15 MG/5ML syrup; Take 2.5 mLs by mouth 4 (four) times daily as needed for cough.  Dispense: 240 mL; Refill: 0 - predniSONE (DELTASONE) 10 MG tablet; 1 tab 3 x day for 2 days, then 1 tab 2 x day for 2 days, then 1 tab 1 x day for 3 days  Dispense: 13 tablet; Refill: 0 - azithromycin (ZITHROMAX) 250 MG tablet; Take 2 tablets on  Day 1,  followed by 1 tablet  daily for 4 more days    for Sinusitis  /Bronchitis  Dispense: 6 each; Refill: 1   Follow Up Instructions:  I discussed the assessment and treatment plan with the patient. The patient was provided an opportunity to ask questions and all were answered. The patient agreed with the plan and demonstrated an understanding of the instructions.   The patient was advised to call back or seek an in-person evaluation if the symptoms worsen or if the condition fails to improve as anticipated.  I provided 20 minutes of non-face-to-face time during this encounter.   Adela Glimpse, NP

## 2023-09-19 NOTE — Patient Instructions (Signed)

## 2023-10-14 ENCOUNTER — Ambulatory Visit: Payer: Medicare Other

## 2023-10-18 ENCOUNTER — Ambulatory Visit (HOSPITAL_BASED_OUTPATIENT_CLINIC_OR_DEPARTMENT_OTHER)
Admission: RE | Admit: 2023-10-18 | Discharge: 2023-10-18 | Disposition: A | Discharge status: Home / Self Care | Payer: Medicare Other | Source: Ambulatory Visit | Attending: Nurse Practitioner | Admitting: Nurse Practitioner

## 2023-10-18 ENCOUNTER — Encounter (HOSPITAL_BASED_OUTPATIENT_CLINIC_OR_DEPARTMENT_OTHER): Payer: Self-pay | Admitting: Radiology

## 2023-10-18 DIAGNOSIS — Z1231 Encounter for screening mammogram for malignant neoplasm of breast: Secondary | ICD-10-CM | POA: Insufficient documentation

## 2023-10-22 NOTE — Progress Notes (Unsigned)
3 MONTH FOLLOW UP  Assessment:    Essential hypertension - continue medications, DASH diet, exercise and monitor at home. Call if greater than 130/80.  -     CBC with Differential/Platelet -     CMP/GFR -     TSH  SVT (supraventricular tachycardia) (HCC) Had ED visits x 2, saw cardiology Controlled with atenolol  No symptoms at this time Continue cardio follow up as recommended  GERD Continue PPI, persistent cough improved, take PPI at night Stop night time snacking, avoid eating for 4 hours Diet/lifestyle discussed; raise HOB on blocks, avoid large/fatty meals, avoid reclining 4 hours after meals  Hyperlipidemia -continue medications, check lipids q12m, decrease fatty foods, increase activity.   Other abnormal glucose Recent A1Cs at goal Discussed diet/exercise, weight management  Defer A1C; check CMP  Vitamin D deficiency At goal at recent check; continue to recommend supplementation for goal of 70-100 Defer vitamin D level  Overweight Lifestyle discussed; increase exercise  Insomnia/anxiety Trazodone 75 mg and melatonin 5 mg working well; Stress management techniques discussed, increase water, good sleep hygiene discussed, increase exercise, and increase veggies.       Over 30 minutes of exam, counseling, chart review, and critical decision making was performed  Future Appointments  Date Time Provider Department Center  10/23/2023 10:30 AM Raynelle Dick, NP [facility] None  01/15/2024 11:00 AM [facility] DX DEXA 1 [facility]  01/21/2024 10:00 AM Lucky Cowboy, MD [facility] None  04/22/2024 10:30 AM Raynelle Dick, NP [facility] None     Subjective:   Jean Drake is a 77 y.o. female who presents for 3 month follow up on hypertension, prediabetes, hyperlipidemia, vitamin D def.   Husband passed away in 20-Nov-2019, has occasional down moments (never full days) but does regular devotions and gratefulness/awareness and managing very well. She is  prescribed trazodone 75 mg for mood/insomnia, also uses 5 mg melatonin, tapered off of xanax. Back to church has been helpful.   She had persistent cough, was having breakthrough with omzeprazole OTC, now on nexium 40 mg with perceived benefit. She alternates allegra/zyrtec for allergies.   Today she mentions neck skin tags, nevi to chest and back that gets irritated by bra, would like these removed.   BMI is There is no height or weight on file to calculate BMI., she has been working on diet and exercise, does walk daily 15-20 min, also very active in yard, mowing several acres. Admits snacking at night Wt Readings from Last 3 Encounters:  09/19/23 142 lb 3.2 oz (64.5 kg)  07/23/23 146 lb 6.4 oz (66.4 kg)  04/19/23 146 lb (66.2 kg)   She has hx of episodes of SVT for which she presented to ED x2; improved with addition of atenolol. She has family hx of CAD, underwent myoview 09/2018 by Dr. Eden Emms which was normal.   Her blood pressure has been controlled at home, today their BP is   She does workout. She denies chest pain, shortness of breath, dizziness.   She is on cholesterol medication (rosuvastatin 10 mg daily). Her cholesterol is not at goal. The cholesterol last visit was:   Lab Results  Component Value Date   CHOL 136 07/23/2023   HDL 67 07/23/2023   LDLCALC 54 07/23/2023   TRIG 74 07/23/2023   CHOLHDL 2.0 07/23/2023    Last A1C in the office was:  Lab Results  Component Value Date   HGBA1C 5.6 07/23/2023   CKD II monitored here. Last GFR  Lab Results  Component Value Date   EGFR 74 07/23/2023   EGFR 71 04/19/2023   EGFR 77 01/16/2023   Patient is on Vitamin D supplement. Lab Results  Component Value Date   VD25OH 78 07/23/2023      Medication Review Current Outpatient Medications on File Prior to Visit  Medication Sig Dispense Refill   Ascorbic Acid (VITAMIN C PO) Take 1,000 mg by mouth daily.      aspirin 81 MG tablet Take 81 mg by mouth daily.     atenolol  (TENORMIN) 50 MG tablet TAKE ONE TABLET BY MOUTH EVERY DAY FOR BLOOD PRESSURE 90 tablet 3   azithromycin (ZITHROMAX) 250 MG tablet Take 2 tablets on  Day 1,  followed by 1 tablet  daily for 4 more days    for Sinusitis  /Bronchitis 6 each 1   benzonatate (TESSALON) 200 MG capsule Take  1 perle  3 x /day for  Cough 90 capsule 3   Cholecalciferol (VITAMIN D) 2000 UNITS tablet Take 6,000 Units by mouth daily.      esomeprazole (NEXIUM) 40 MG capsule TAKE ONE CAPSULE BY MOUTH DAILY TO prevent acid reflux AND hoarseness 90 capsule 3   fexofenadine (ALLEGRA ALLERGY) 180 MG tablet Take 180 mg by mouth daily.     MAGNESIUM OXIDE PO Take 400 mg by mouth daily.     meclizine (ANTIVERT) 25 MG tablet TAKE 1/2-1 TABLET BY MOUTH 2-3 TIMES DAILY AS NEEDED FOR dizziness/vertigo 270 tablet 3   Melatonin 5 MG TABS Take by mouth. Patient takes 3 tablets to equal 15 mg at bedtime.     Multiple Vitamin (MULTIVITAMIN) tablet Take 1 tablet by mouth daily.     multivitamin-lutein (OCUVITE-LUTEIN) CAPS capsule Take 1 capsule by mouth daily.     Omega-3 Fatty Acids (FISH OIL) 1000 MG CAPS Take 1 capsule by mouth daily.     OVER THE COUNTER MEDICATION Takes Vitamin B Complex 1 daily     predniSONE (DELTASONE) 10 MG tablet 1 tab 3 x day for 2 days, then 1 tab 2 x day for 2 days, then 1 tab 1 x day for 3 days 13 tablet 0   promethazine-dextromethorphan (PROMETHAZINE-DM) 6.25-15 MG/5ML syrup Take 2.5 mLs by mouth 4 (four) times daily as needed for cough. 240 mL 0   rosuvastatin (CRESTOR) 10 MG tablet TAKE ONE TABLET BY MOUTH DAILY FOR cholesterol 90 tablet 3   traZODone (DESYREL) 50 MG tablet TAKE 1/2-2 TABLETS BY MOUTH AT BEDTIME AS NEEDED FOR SLEEP 180 tablet 3   TURMERIC PO Take 1,500 mg by mouth.     Zinc 50 MG TABS Take 50 mg by mouth daily.     No current facility-administered medications on file prior to visit.    Current Problems (verified) Patient Active Problem List   Diagnosis Date Noted   COVID-19  (09/13/2021) 09/14/2021   Vertigo 09/05/2021   Cough present for greater than 3 weeks 03/20/2021   CKD (chronic kidney disease) stage 2, GFR 60-89 ml/min 12/11/2020   Osteopenia 09/05/2018   FHx: heart disease 06/18/2018   Insomnia 12/06/2017   SVT (supraventricular tachycardia) (HCC) 11/15/2015   Overweight (BMI 25.0-29.9) 11/18/2014   Medication management 04/20/2014   Essential hypertension 10/20/2013   Hyperlipidemia, mixed 10/20/2013   Abnormal glucose 10/20/2013   Vitamin D deficiency 10/20/2013   GERD 10/20/2013    Allergies Allergies  Allergen Reactions   Penicillins Anaphylaxis   Codeine Nausea And Vomiting   Sudafed [Pseudoephedrine Hcl]  Makes heart race, per pt     SURGICAL HISTORY She  has a past surgical history that includes No past surgeries. FAMILY HISTORY Her family history includes Anuerysm in her sister; Breast cancer in her mother; CAD in her mother; Heart attack (age of onset: 58) in her sister; Heart disease in her mother; Kidney failure in her paternal uncle; Lung cancer in her father; Polycystic kidney disease in her paternal uncle; Stroke in her maternal grandmother. SOCIAL HISTORY She  reports that she has never smoked. She has never used smokeless tobacco. She reports that she does not drink alcohol.   Review of Systems  Constitutional:  Negative for malaise/fatigue and weight loss.  HENT:  Negative for hearing loss and tinnitus.   Eyes:  Negative for blurred vision and double vision.  Respiratory:  Positive for cough (improved with PPI/H2i). Negative for shortness of breath and wheezing.   Cardiovascular:  Negative for chest pain, palpitations, orthopnea, claudication and leg swelling.  Gastrointestinal:  Negative for abdominal pain, blood in stool, constipation, diarrhea, heartburn, melena, nausea and vomiting.  Genitourinary: Negative.   Musculoskeletal:  Negative for joint pain and myalgias.  Skin:  Negative for rash.  Neurological:   Negative for dizziness, tingling, sensory change, weakness and headaches.  Endo/Heme/Allergies:  Positive for environmental allergies. Negative for polydipsia.  Psychiatric/Behavioral: Negative.    All other systems reviewed and are negative.   Objective:   There were no vitals filed for this visit.    There is no height or weight on file to calculate BMI.  General appearance: alert, no distress, WD/WN,  female HEENT: normocephalic, sclerae anicteric, TMs pearly, nares patent, no discharge or erythema, pharynx normal Oral cavity: MMM, no lesions Neck: supple, no lymphadenopathy, no thyromegaly, no masses Heart: RRR, normal S1, S2, no murmurs Lungs: CTA bilaterally, no wheezes, rhonchi, or rales Abdomen: +bs, soft, non tender, + distended/full lower AB, no masses, no hepatomegaly, no splenomegaly Musculoskeletal: nontender, no swelling, no obvious deformity Extremities: no edema, no cyanosis, no clubbing Pulses: 2+ symmetric, upper and lower extremities, normal cap refill Neurological: alert, oriented x 3, CN2-12 intact, strength normal upper extremities and lower extremities, sensation normal throughout, DTRs 2+ throughout, no cerebellar signs, gait normal Psychiatric: normal affect, behavior normal, pleasant  Skin: warm/dry, intact, no rash; skin tags to neck, raised nevi with even color/borders to check and mid back/bra line   Raynelle Dick, NP   10/22/2023

## 2023-10-23 ENCOUNTER — Encounter: Payer: Self-pay | Admitting: Nurse Practitioner

## 2023-10-23 ENCOUNTER — Ambulatory Visit (INDEPENDENT_AMBULATORY_CARE_PROVIDER_SITE_OTHER): Payer: Medicare Other | Admitting: Nurse Practitioner

## 2023-10-23 VITALS — BP 130/76 | HR 63 | Temp 97.5°F | Ht 60.5 in | Wt 143.4 lb

## 2023-10-23 DIAGNOSIS — I10 Essential (primary) hypertension: Secondary | ICD-10-CM

## 2023-10-23 DIAGNOSIS — I471 Supraventricular tachycardia, unspecified: Secondary | ICD-10-CM

## 2023-10-23 DIAGNOSIS — E559 Vitamin D deficiency, unspecified: Secondary | ICD-10-CM | POA: Diagnosis not present

## 2023-10-23 DIAGNOSIS — Z79899 Other long term (current) drug therapy: Secondary | ICD-10-CM | POA: Diagnosis not present

## 2023-10-23 DIAGNOSIS — G4700 Insomnia, unspecified: Secondary | ICD-10-CM

## 2023-10-23 DIAGNOSIS — R42 Dizziness and giddiness: Secondary | ICD-10-CM

## 2023-10-23 DIAGNOSIS — E663 Overweight: Secondary | ICD-10-CM

## 2023-10-23 DIAGNOSIS — E782 Mixed hyperlipidemia: Secondary | ICD-10-CM

## 2023-10-23 DIAGNOSIS — R7309 Other abnormal glucose: Secondary | ICD-10-CM | POA: Diagnosis not present

## 2023-10-23 DIAGNOSIS — R053 Chronic cough: Secondary | ICD-10-CM

## 2023-10-23 DIAGNOSIS — K2100 Gastro-esophageal reflux disease with esophagitis, without bleeding: Secondary | ICD-10-CM

## 2023-10-23 MED ORDER — BENZONATATE 200 MG PO CAPS: ORAL_CAPSULE | ORAL | 3 refills | Status: DC

## 2023-10-23 NOTE — Patient Instructions (Signed)

## 2023-10-24 LAB — LIPID PANEL
Cholesterol: 134 mg/dL
HDL: 73 mg/dL
LDL Cholesterol (Calc): 47 mg/dL
Non-HDL Cholesterol (Calc): 61 mg/dL
Total CHOL/HDL Ratio: 1.8 (calc)
Triglycerides: 61 mg/dL

## 2023-10-24 LAB — COMPLETE METABOLIC PANEL WITH GFR
AG Ratio: 1.9 (calc) (ref 1.0–2.5)
ALT: 15 U/L (ref 6–29)
AST: 23 U/L (ref 10–35)
Albumin: 4.4 g/dL (ref 3.6–5.1)
Alkaline phosphatase (APISO): 71 U/L (ref 37–153)
BUN: 15 mg/dL (ref 7–25)
CO2: 27 mmol/L (ref 20–32)
Calcium: 9.6 mg/dL (ref 8.6–10.4)
Chloride: 103 mmol/L (ref 98–110)
Creat: 0.78 mg/dL (ref 0.60–1.00)
Globulin: 2.3 g/dL (ref 1.9–3.7)
Glucose, Bld: 92 mg/dL (ref 65–99)
Potassium: 4.2 mmol/L (ref 3.5–5.3)
Sodium: 139 mmol/L (ref 135–146)
Total Bilirubin: 0.5 mg/dL (ref 0.2–1.2)
Total Protein: 6.7 g/dL (ref 6.1–8.1)
eGFR: 78 mL/min/{1.73_m2} (ref >=60)

## 2023-10-24 LAB — CBC WITH DIFFERENTIAL/PLATELET
Absolute Lymphocytes: 785 {cells}/uL — ABNORMAL LOW (ref 850–3900)
Absolute Monocytes: 484 {cells}/uL (ref 200–950)
Basophils Absolute: 59 {cells}/uL (ref 0–200)
Basophils Relative: 1 %
Eosinophils Absolute: 53 {cells}/uL (ref 15–500)
Eosinophils Relative: 0.9 %
HCT: 42.5 % (ref 35.0–45.0)
Hemoglobin: 13.9 g/dL (ref 11.7–15.5)
MCH: 29.6 pg (ref 27.0–33.0)
MCHC: 32.7 g/dL (ref 32.0–36.0)
MCV: 90.6 fL (ref 80.0–100.0)
MPV: 9.4 fL (ref 7.5–12.5)
Monocytes Relative: 8.2 %
Neutro Abs: 4519 {cells}/uL (ref 1500–7800)
Neutrophils Relative %: 76.6 %
Platelets: 276 10*3/uL (ref 140–400)
RBC: 4.69 10*6/uL (ref 3.80–5.10)
RDW: 12.3 % (ref 11.0–15.0)
Total Lymphocyte: 13.3 %
WBC: 5.9 10*3/uL (ref 3.8–10.8)

## 2023-10-24 LAB — TSH: TSH: 0.8 m[IU]/L (ref 0.40–4.50)

## 2023-11-16 ENCOUNTER — Other Ambulatory Visit: Payer: Self-pay | Admitting: Nurse Practitioner

## 2023-11-16 DIAGNOSIS — E782 Mixed hyperlipidemia: Secondary | ICD-10-CM

## 2023-11-18 ENCOUNTER — Ambulatory Visit (HOSPITAL_BASED_OUTPATIENT_CLINIC_OR_DEPARTMENT_OTHER): Admission: RE | Admit: 2023-11-18 | Payer: Medicare Other | Source: Ambulatory Visit

## 2023-12-10 DIAGNOSIS — K080 Exfoliation of teeth due to systemic causes: Secondary | ICD-10-CM | POA: Diagnosis not present

## 2024-01-15 ENCOUNTER — Other Ambulatory Visit: Payer: Medicare Other

## 2024-01-21 ENCOUNTER — Encounter: Payer: Medicare Other | Admitting: Internal Medicine

## 2024-02-19 ENCOUNTER — Ambulatory Visit (HOSPITAL_BASED_OUTPATIENT_CLINIC_OR_DEPARTMENT_OTHER)
Admission: RE | Admit: 2024-02-19 | Discharge: 2024-02-19 | Disposition: A | Discharge status: Home / Self Care | Payer: Medicare Other | Source: Ambulatory Visit | Attending: Nurse Practitioner | Admitting: Nurse Practitioner

## 2024-02-19 DIAGNOSIS — M8588 Other specified disorders of bone density and structure, other site: Secondary | ICD-10-CM | POA: Insufficient documentation

## 2024-02-19 DIAGNOSIS — Z780 Asymptomatic menopausal state: Secondary | ICD-10-CM | POA: Diagnosis not present

## 2024-02-19 DIAGNOSIS — M8589 Other specified disorders of bone density and structure, multiple sites: Secondary | ICD-10-CM | POA: Diagnosis not present

## 2024-03-10 ENCOUNTER — Ambulatory Visit: Payer: Medicare Other | Admitting: Family Medicine

## 2024-03-10 ENCOUNTER — Encounter: Payer: Self-pay | Admitting: Family Medicine

## 2024-03-10 VITALS — BP 150/75 | HR 70 | Temp 98.8°F | Resp 16 | Ht 60.5 in | Wt 144.4 lb

## 2024-03-10 DIAGNOSIS — E782 Mixed hyperlipidemia: Secondary | ICD-10-CM

## 2024-03-10 DIAGNOSIS — F5101 Primary insomnia: Secondary | ICD-10-CM

## 2024-03-10 DIAGNOSIS — I471 Supraventricular tachycardia, unspecified: Secondary | ICD-10-CM

## 2024-03-10 DIAGNOSIS — K2100 Gastro-esophageal reflux disease with esophagitis, without bleeding: Secondary | ICD-10-CM

## 2024-03-10 DIAGNOSIS — N182 Chronic kidney disease, stage 2 (mild): Secondary | ICD-10-CM | POA: Diagnosis not present

## 2024-03-10 DIAGNOSIS — I10 Essential (primary) hypertension: Secondary | ICD-10-CM | POA: Diagnosis not present

## 2024-03-10 DIAGNOSIS — R42 Dizziness and giddiness: Secondary | ICD-10-CM

## 2024-03-10 DIAGNOSIS — R053 Chronic cough: Secondary | ICD-10-CM

## 2024-03-10 NOTE — Patient Instructions (Addendum)
 A few things to remember from today's visit:  SVT (supraventricular tachycardia) (HCC)  Essential hypertension  Gastroesophageal reflux disease with esophagitis without hemorrhage Nexium  30 min before dinner. Meclizine  daily as needed. No changes in rest. Monitor blood pressure at home, should be under 140/90.   If you need refills for medications you take chronically, please call your pharmacy. Do not use My Chart to request refills or for acute issues that need immediate attention. If you send a my chart message, it may take a few days to be addressed, specially if I am not in the office.  Please be sure medication list is accurate. If a new problem present, please set up appointment sooner than planned today.

## 2024-03-10 NOTE — Progress Notes (Signed)
 HPI: [name] is a 78 y.o. female with a PMHx significant for SVT, HTN, GERD, CKD II, vitamin D  deficiency, insomnia, and HLD, who is here today to establish care.  Former PCP: Jean Genet, MD Last preventive routine visit: 07/23/2023  Exercise: Patient is active at home doing the housework and yardwork.  Diet: She cooks at home and eats healthy in general. She tries to eat vegetables daily.  Sleep: Her sleep varies. Some nights she will get 4-5 hours and others she will get 7-8.  Alcohol Use: none Smoking: never Vision: UTD on routine vision care.  Dental: UTD on routine dental care.   Chronic medical problems:   Hypertension:  Medications: Currently on atenolol  50 mg daily.  BP readings at home: She doesn't check her BP regularly at home.  Side effects: none SVT, evaluated by cardiologist in 2021. Stress test in 09/2018, echo 12/22/15,and coronary calcium  score in 09/2018. Coronary calcium  score of 5. This was 62 th percentile for age and sex matched control.  Echo 12/2015:The  estimated ejection fraction was in the range of 65% to 70%. Wall motion was normal; there were no regional wall motion   abnormalities. Doppler parameters are consistent with abnormal left ventricular relaxation (grade 1 diastolic dysfunction).   She has occasional palpitations, but denies associated chest pain or SOB.  Also complains of occasional tightness sensation and swelling in her ankles.  Negative for unusual or severe headache, visual changes, exertional chest pain, dyspnea, or focal weakness.  Lab Results  Component Value Date   CREATININE 0.78 10/23/2023   BUN 15 10/23/2023   NA 139 10/23/2023   K 4.2 10/23/2023   CL 103 10/23/2023   CO2 27 10/23/2023   CKD II: Not aware of Dx. Negative for gross hematuria, foam in urine,or decreased urine output. Lab Results  Component Value Date   MICROALBUR 0.2 01/16/2023   MICROALBUR <0.2 12/20/2021   Hyperlipidemia: Currently on  rosuvastatin  10 mg daily.   Lab Results  Component Value Date   CHOL 134 10/23/2023   HDL 73 10/23/2023   LDLCALC 47 10/23/2023   TRIG 61 10/23/2023   CHOLHDL 1.8 10/23/2023   Vitamin D  deficiency:  She takes 6000 units of vitamin D  daily.  Lab Results  Component Value Date   VD25OH 78 07/23/2023   Insomnia:  She takes trazodone  50 mg 1.5 tab at night.  Even with the medication, he sleep varies.   Cough:  Problem has been going on for years. She takes benzonatate  200 mg at night . GERD: She is on Nexium  40 mg at night.  Also endorses chronic dry mouth and occasional allergy symptoms.   Vertigo:  She takes meclizine  25 mg as needed at night.  She has had problems with vertigo for several years. During episodes, she will have a spinning sensation while lying in bed.  Negative for hearing changes.  Osteopenia: Takes Calcium  and vit D supplementation. DEXA 02/19/2024, lumbar T score -2.2 FRAX calculator based on patient-reported risk factors. Major osteoporotic fracture: 11.5% Hip fracture: 2.2%  Review of Systems  Constitutional:  Negative for activity change, appetite change and fever.  HENT:  Negative for mouth sores, nosebleeds, sore throat and trouble swallowing.   Respiratory:  Negative for chest tightness and wheezing.   Gastrointestinal:  Negative for abdominal pain, nausea and vomiting.  Allergic/Immunologic: Positive for environmental allergies.  Neurological:  Negative for syncope and facial asymmetry.  Psychiatric/Behavioral:  Positive for sleep disturbance. Negative for confusion.  See other pertinent positives and negatives in HPI.  Current Outpatient Medications on File Prior to Visit  Medication Sig Dispense Refill   Ascorbic Acid (VITAMIN C PO) Take 1,000 mg by mouth daily.      aspirin 81 MG tablet Take 81 mg by mouth daily.     atenolol  (TENORMIN ) 50 MG tablet TAKE ONE TABLET BY MOUTH EVERY DAY FOR BLOOD PRESSURE 90 tablet 3   benzonatate  (TESSALON )  200 MG capsule Take  1 perle  3 x /day for  Cough 90 capsule 3   Cholecalciferol (VITAMIN D ) 2000 UNITS tablet Take 6,000 Units by mouth daily.      esomeprazole  (NEXIUM ) 40 MG capsule TAKE ONE CAPSULE BY MOUTH DAILY TO prevent acid reflux AND hoarseness 90 capsule 3   fexofenadine (ALLEGRA ALLERGY) 180 MG tablet Take 180 mg by mouth daily.     MAGNESIUM  OXIDE PO Take 400 mg by mouth daily.     meclizine  (ANTIVERT ) 25 MG tablet TAKE 1/2-1 TABLET BY MOUTH 2-3 TIMES DAILY AS NEEDED FOR dizziness/vertigo 270 tablet 3   Melatonin 5 MG TABS Take by mouth. Patient takes 3 tablets to equal 15 mg at bedtime.     Multiple Vitamin (MULTIVITAMIN) tablet Take 1 tablet by mouth daily.     multivitamin-lutein (OCUVITE-LUTEIN) CAPS capsule Take 1 capsule by mouth daily.     Omega-3 Fatty Acids (FISH OIL) 1000 MG CAPS Take 1 capsule by mouth daily.     OVER THE COUNTER MEDICATION Takes Vitamin B Complex 1 daily     rosuvastatin  (CRESTOR ) 10 MG tablet TAKE ONE TABLET BY MOUTH DAILY FOR cholesterol 90 tablet 3   traZODone  (DESYREL ) 50 MG tablet TAKE 1/2-2 TABLETS BY MOUTH AT BEDTIME AS NEEDED FOR SLEEP 180 tablet 3   TURMERIC PO Take 1,500 mg by mouth.     Zinc 50 MG TABS Take 50 mg by mouth daily.     No current facility-administered medications on file prior to visit.   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   GERD 10/20/2013   Hyperlipidemia 10/20/2013   Migraine, unspecified, without mention of intractable migraine without mention of status migrainosus 10/20/2013   Morbid obesity (HCC) 11/18/2014   Prediabetes 10/20/2013   SVT (supraventricular tachycardia) (HCC) 11/15/2015   Vitamin D  deficiency 10/20/2013   Allergies  Allergen Reactions   Penicillins Anaphylaxis   Codeine Nausea And Vomiting   Sudafed [Pseudoephedrine Hcl]     Makes heart race, per pt    Family History  Problem Relation Age of Onset   Heart disease Mother    Breast  cancer Mother    CAD Mother        stents emergently in her 47s   Lung cancer Father        smoker, mets to brain   Stroke Maternal Grandmother    Polycystic kidney disease Paternal Uncle    Kidney failure Paternal Uncle    Heart attack Sister 70       LAD   Anuerysm Sister    Social History   Socioeconomic History   Marital status: Widowed    Spouse name: Not on file   Number of children: Not on file   Years of education: Not on file   Highest education level: Not on file  Occupational History   Not on file  Tobacco Use   Smoking status: Never   Smokeless tobacco: Never  Substance and Sexual Activity  Alcohol use: No   Drug use: Not on file   Sexual activity: Not on file  Other Topics Concern   Not on file  Social History Narrative   Not on file   Social Drivers of Health   Financial Resource Strain: Patient Declined (03/09/2024)   Overall Financial Resource Strain (CARDIA)    Difficulty of Paying Living Expenses: Patient declined  Food Insecurity: Patient Declined (03/09/2024)   Hunger Vital Sign    Worried About Running Out of Food in the Last Year: Patient declined    Ran Out of Food in the Last Year: Patient declined  Transportation Needs: Patient Declined (03/09/2024)   PRAPARE - Administrator, Civil Service (Medical): Patient declined    Lack of Transportation (Non-Medical): Patient declined  Physical Activity: Unknown (03/09/2024)   Exercise Vital Sign    Days of Exercise per Week: Patient declined    Minutes of Exercise per Session: Not on file  Stress: Patient Declined (03/09/2024)   [employer] of Occupational Health - Occupational Stress Questionnaire    Feeling of Stress : Patient declined  Social Connections: Unknown (03/09/2024)   Social Connection and Isolation Panel [NHANES]    Frequency of Communication with Friends and Family: Patient declined    Frequency of Social Gatherings with Friends and Family: Patient declined     Attends Religious Services: Patient declined    Active Member of Clubs or Organizations: Patient declined    Attends Banker Meetings: Not on file    Marital Status: Patient declined   Today's Vitals   03/10/24 1142 03/10/24 1201  BP: (!) 160/82 (!) 150/75  Pulse: 70   Resp: 16   Temp: 98.8 F (37.1 C)   TempSrc: Oral   SpO2: 97%   Weight: 144 lb 6.4 oz (65.5 kg)   Height: 5' 0.5" (1.537 m)    Body mass index is 27.74 kg/m.  Physical Exam Vitals and nursing note reviewed.  Constitutional:      General: She is not in acute distress.    Appearance: She is well-developed.  HENT:     Head: Normocephalic and atraumatic.     Mouth/Throat:     Mouth: Mucous membranes are moist.     Pharynx: Oropharynx is clear.  Eyes:     Conjunctiva/sclera: Conjunctivae normal.  Cardiovascular:     Rate and Rhythm: Normal rate and regular rhythm.     Pulses:          Dorsalis pedis pulses are 2+ on the right side and 2+ on the left side.     Heart sounds: No murmur heard.    Comments: Trace bilateral pitting edema Pulmonary:     Effort: Pulmonary effort is normal. No respiratory distress.     Breath sounds: Normal breath sounds.  Abdominal:     Palpations: Abdomen is soft. There is no hepatomegaly or mass.     Tenderness: There is no abdominal tenderness.  Musculoskeletal:     Right lower leg: Pitting Edema present.     Left lower leg: Pitting Edema present.  Lymphadenopathy:     Cervical: No cervical adenopathy.  Skin:    General: Skin is warm.     Findings: No erythema or rash.  Neurological:     General: No focal deficit present.     Mental Status: She is alert and oriented to person, place, and time.     Cranial Nerves: No cranial nerve deficit.     Gait: Gait  normal.  Psychiatric:        Mood and Affect: Mood and affect normal.    ASSESSMENT AND PLAN:  Ms. Kovacevich was seen today to establish care.  SVT (supraventricular tachycardia) (HCC) Assessment &  Plan: Occasional palpitations, otherwise stable. Currently on Atenolol  50 mg daily.  Orders: -     Atenolol ; TAKE ONE TABLET BY MOUTH EVERY DAY FOR BLOOD PRESSURE  Dispense: 90 tablet; Refill: 2  Essential hypertension Assessment & Plan: Elevated today. Recommend monitoring BP at home, should be at least < 140/90. Continue Atenolol  50 mg daily and low salt diet.  Orders: -     Atenolol ; TAKE ONE TABLET BY MOUTH EVERY DAY FOR BLOOD PRESSURE  Dispense: 90 tablet; Refill: 2  Gastroesophageal reflux disease with esophagitis without hemorrhage Assessment & Plan: On Nexium  40 mg, recommend taking medication 30 min before a meal. Continue GERD precautions.  Orders: -     Esomeprazole  Magnesium ; Take 1 capsule (40 mg total) by mouth daily before breakfast. TAKE ONE CAPSULE BY MOUTH DAILY TO prevent acid reflux AND hoarseness  Dispense: 90 capsule; Refill: 2  CKD (chronic kidney disease) stage 2, GFR 60-89 ml/min Assessment & Plan: Discussed Dx and treatment. Continue low salt diet, adequate hydration,and avoidance of NSAID's.   Hyperlipidemia, mixed Assessment & Plan: Continue Rosuvastatin  10 mg daily and low fat diet.   Primary insomnia Assessment & Plan: Problem is stable. Continue Trazodone  50 mg 1.5 tabs at bedtime as needed. Good sleep hygiene also recommended.  Orders: -     traZODone  HCl; Take 1.5 tablets (75 mg total) by mouth at bedtime.  Dispense: 150 tablet; Refill: 2  Chronic cough Assessment & Plan: Stable. We discussed possible etiologies. She has been on Benzonatate  200 mg at bedtime and has helped, so no changes.  Orders: -     Benzonatate ; Take  1 perle  3 x /day for  Cough  Dispense: 90 capsule; Refill: 2  Vertigo Assessment & Plan: She takes Meclizine  25 mg daily at night, recommend doing so as needed. Side effects discussed. Fall precautions to continue.   Return in about 5 months (around 08/10/2024) for CPE.  I, Fritz Jewel Wierda, acting as a  scribe for Sharde Gover [place], MD., have documented all relevant documentation on the behalf of Jinna Weinman [place], MD, as directed by  Oaklee Sunga [place], MD while in the presence of Auda Finfrock [place], MD.   I, Nevaya Nagele [place], MD, have reviewed all documentation for this visit. The documentation on 03/10/24 for the exam, diagnosis, procedures, and orders are all accurate and complete.  Lillyonna Armstead G. [place], MD  [facility]. [place] office.

## 2024-03-15 MED ORDER — TRAZODONE HCL 50 MG PO TABS
75.0000 mg | ORAL_TABLET | Freq: Every day | ORAL | 2 refills | Status: DC
Start: 2024-03-15 — End: 2024-08-31

## 2024-03-15 MED ORDER — ESOMEPRAZOLE MAGNESIUM 40 MG PO CPDR: 40.0000 mg | DELAYED_RELEASE_CAPSULE | Freq: Every day | ORAL | 2 refills | Status: DC

## 2024-03-15 MED ORDER — BENZONATATE 200 MG PO CAPS: ORAL_CAPSULE | ORAL | 2 refills | Status: DC

## 2024-03-15 MED ORDER — ATENOLOL 50 MG PO TABS: ORAL_TABLET | ORAL | 2 refills | Status: AC

## 2024-03-15 NOTE — Assessment & Plan Note (Signed)
 Stable. We discussed possible etiologies. She has been on Benzonatate  200 mg at bedtime and has helped, so no changes.

## 2024-03-15 NOTE — Assessment & Plan Note (Signed)
 She takes Meclizine  25 mg daily at night, recommend doing so as needed. Side effects discussed. Fall precautions to continue.

## 2024-03-15 NOTE — Assessment & Plan Note (Signed)
 On Nexium  40 mg, recommend taking medication 30 min before a meal. Continue GERD precautions.

## 2024-03-15 NOTE — Assessment & Plan Note (Signed)
Continue Rosuvastatin 10 mg daily and low-fat diet.

## 2024-03-15 NOTE — Assessment & Plan Note (Signed)
 Problem is stable. Continue Trazodone  50 mg 1.5 tabs at bedtime as needed. Good sleep hygiene also recommended.

## 2024-03-15 NOTE — Assessment & Plan Note (Signed)
 Discussed Dx and treatment. Continue low salt diet, adequate hydration,and avoidance of NSAID's.

## 2024-03-15 NOTE — Assessment & Plan Note (Signed)
 Elevated today. Recommend monitoring BP at home, should be at least < 140/90. Continue Atenolol  50 mg daily and low salt diet.

## 2024-03-15 NOTE — Assessment & Plan Note (Signed)
 Occasional palpitations, otherwise stable. Currently on Atenolol  50 mg daily.

## 2024-04-15 DIAGNOSIS — H25813 Combined forms of age-related cataract, bilateral: Secondary | ICD-10-CM | POA: Diagnosis not present

## 2024-04-22 ENCOUNTER — Ambulatory Visit: Payer: Medicare Other | Admitting: Nurse Practitioner

## 2024-06-08 DIAGNOSIS — K080 Exfoliation of teeth due to systemic causes: Secondary | ICD-10-CM | POA: Diagnosis not present

## 2024-07-06 ENCOUNTER — Encounter: Payer: Self-pay | Admitting: Family Medicine

## 2024-07-06 ENCOUNTER — Ambulatory Visit (INDEPENDENT_AMBULATORY_CARE_PROVIDER_SITE_OTHER): Admitting: Family Medicine

## 2024-07-06 VITALS — BP 124/70 | HR 82 | Temp 98.6°F | Wt 145.0 lb

## 2024-07-06 DIAGNOSIS — J029 Acute pharyngitis, unspecified: Secondary | ICD-10-CM | POA: Diagnosis not present

## 2024-07-06 DIAGNOSIS — J019 Acute sinusitis, unspecified: Secondary | ICD-10-CM

## 2024-07-06 DIAGNOSIS — R059 Cough, unspecified: Secondary | ICD-10-CM

## 2024-07-06 LAB — POC COVID19 BINAXNOW: SARS Coronavirus 2 Ag: NEGATIVE

## 2024-07-06 LAB — POCT RAPID STREP A (OFFICE): Rapid Strep A Screen: NEGATIVE

## 2024-07-06 MED ORDER — METHYLPREDNISOLONE 4 MG PO TBPK
ORAL_TABLET | ORAL | 0 refills | Status: DC
Start: 2024-07-06 — End: 2024-07-23

## 2024-07-06 MED ORDER — AZITHROMYCIN 250 MG PO TABS
ORAL_TABLET | ORAL | 0 refills | Status: DC
Start: 2024-07-06 — End: 2024-08-10

## 2024-07-06 NOTE — Progress Notes (Signed)
   Subjective:    Patient ID: Jean Drake, female    DOB: 08/11/1946, 78 y.o.   MRN: [id number]  HPI Here for 2 weeks of stuffy head, PND, pain in both ears, and coughing up yellow sputum. No fever or SOB. She has chronic vertigo, and this has ben worse the past week. She uses Meclizine  for that.    Review of Systems  Constitutional: Negative.   HENT:  Positive for congestion, ear pain, postnasal drip and sinus pressure. Negative for sore throat.   Eyes: Negative.   Respiratory:  Positive for cough. Negative for shortness of breath and wheezing.   Neurological:  Positive for dizziness. Negative for headaches.       Objective:   Physical Exam Constitutional:      Appearance: Normal appearance.  HENT:     Right Ear: Tympanic membrane, ear canal and external ear normal.     Left Ear: Tympanic membrane, ear canal and external ear normal.     Nose: Nose normal.     Mouth/Throat:     Pharynx: Oropharynx is clear.  Eyes:     Conjunctiva/sclera: Conjunctivae normal.  Pulmonary:     Effort: Pulmonary effort is normal.     Breath sounds: Normal breath sounds.  Lymphadenopathy:     Cervical: No cervical adenopathy.  Neurological:     Mental Status: She is alert.           Assessment & Plan:  Sinusitis, treat with a Zpack and a Medrol  dose pack. Garnette Olmsted, MD

## 2024-07-23 ENCOUNTER — Ambulatory Visit (INDEPENDENT_AMBULATORY_CARE_PROVIDER_SITE_OTHER): Admitting: Family Medicine

## 2024-07-23 ENCOUNTER — Encounter: Payer: Self-pay | Admitting: Family Medicine

## 2024-07-23 VITALS — Wt 143.4 lb

## 2024-07-23 DIAGNOSIS — Z0000 Encounter for general adult medical examination without abnormal findings: Secondary | ICD-10-CM

## 2024-07-23 NOTE — Progress Notes (Signed)
 Patient unable to obtain vital signs due to telehealth visit

## 2024-07-23 NOTE — Progress Notes (Signed)
 PATIENT CHECK-IN and HEALTH RISK ASSESSMENT QUESTIONNAIRE:  -completed by phone/video for upcoming Medicare Preventive Visit  Pre-Visit Check-in: 1)Vitals (height, wt, BP, etc) - record in vitals section for visit on day of visit Request home vitals (wt, BP, etc.) and enter into vitals, THEN update Vital Signs SmartPhrase below at the top of the HPI. See below.  2)Review and Update Medications, Allergies PMH, Surgeries, Social history in Epic 3)Hospitalizations in the last year with date/reason? no  4)Review and Update Care Team (patient's specialists) in Epic 5) Complete PHQ9 in Epic  6) Complete Fall Screening in Epic 7)Review all Health Maintenance Due and order if not done.  Medicare Wellness Patient Questionnaire:  Answer theses question about your habits: How often do you have a drink containing alcohol?n/a  How many drinks containing alcohol do you have on a typical day when you are drinking?n/a How often do you have six or more drinks on one occasion?n/a Have you ever smoked?no Quit date if applicable? N/a   How many packs a day do/did you smoke? no Do you use smokeless tobacco?no Do you use an illicit drugs?no On average, how many days per week do you engage in moderate to strenuous exercise (like a brisk walk)?n - but is active caring for home and yard On average, how many minutes do you engage in exercise at this level? At least Are you sexually active? No Number of partners?n/a  Typical breakfast: Peanut butter, grapes, eggs, tomatoes  Typical lunch: Varies  Typical dinner: Varies  Typical snacks: Nuts   Beverages:  Water and coffee  Answer theses question about your everyday activities: Can you perform most household chores?yes  Are you deaf or have significant trouble hearing?no Do you feel that you have a problem with memory?no Do you feel safe at home? Yes  Last dentist visit? 6 months ago  8. Do you have any difficulty performing your everyday  activities?no Are you having any difficulty walking, taking medications on your own, and or difficulty managing daily home needs?no Do you have difficulty walking or climbing stairs?no Do you have difficulty dressing or bathing?no Do you have difficulty doing errands alone such as visiting a doctor's office or shopping?no Do you currently have any difficulty preparing food and eating?no Do you currently have any difficulty using the toilet?no Do you have any difficulty managing your finances?no Do you have any difficulties with housekeeping of managing your housekeeping?no   Do you have Advanced Directives in place (Living Will, Healthcare Power or Attorney)? Yes    Last eye Exam and location? 2025    Do you currently use prescribed or non-prescribed narcotic or opioid pain medications? no  Do you have a history or close family history of breast, ovarian, tubal or peritoneal cancer or a family member with BRCA (breast cancer susceptibility 1 and 2) gene mutations? Mom- Breast cancer    Nurse/Assistant Credentials/time stamp: MG 12:17 PM    ----------------------------------------------------------------------------------------------------------------------------------------------------------------------------------------------------------------------  Because this visit was a virtual/telehealth visit, some criteria may be missing or patient reported. Any vitals not documented were not able to be obtained and vitals that have been documented are patient reported.    MEDICARE ANNUAL PREVENTIVE VISIT WITH PROVIDER: (Welcome to Medicare, initial annual wellness or annual wellness exam)  Virtual Visit via Video Note  I connected with Jean Drake on 07/23/24 by a video enabled telemedicine application and verified that I am speaking with the correct person using two identifiers.  Location patient: home Location provider:work or home office  Persons participating in the virtual visit:  patient, provider  Concerns and/or follow up today: no new concerns.    See HM section in Epic for other details of completed HM.    ROS: negative for report of fevers, unintentional weight loss, vision changes, vision loss, hearing loss or change, chest pain, sob, hemoptysis, melena, hematochezia, hematuria, falls, bleeding or bruising, thoughts of suicide or self harm, memory loss  Patient-completed extensive health risk assessment - reviewed and discussed with the patient: See Health Risk Assessment completed with patient prior to the visit either above or in recent phone note. This was reviewed in detailed with the patient today and appropriate recommendations, orders and referrals were placed as needed per Summary below and patient instructions.   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   GERD 10/20/2013   Hyperlipidemia 10/20/2013   Migraine, unspecified, without mention of intractable migraine without mention of status migrainosus 10/20/2013   Morbid obesity (HCC) 11/18/2014   Prediabetes 10/20/2013   SVT (supraventricular tachycardia) (HCC) 11/15/2015   Vitamin D  deficiency 10/20/2013    Past Surgical History:  Procedure Laterality Date   NO PAST SURGERIES      Social History   Socioeconomic History   Marital status: Widowed    Spouse name: Not on file   Number of children: Not on file   Years of education: Not on file   Highest education level: Not on file  Occupational History   Not on file  Tobacco Use    Smoking status: Never   Smokeless tobacco: Never  Substance and Sexual Activity   Alcohol use: No   Drug use: Not on file   Sexual activity: Not on file  Other Topics Concern   Not on file  Social History Narrative   Not on file   Social Drivers of Health   Financial Resource Strain: Patient Declined (07/22/2024)   Overall Financial Resource Strain (CARDIA)    Difficulty of Paying Living Expenses: Patient declined  Food Insecurity: Patient Declined (07/22/2024)   Hunger Vital Sign    Worried About Running Out of Food in the Last Year: Patient declined    Ran Out of Food in the Last Year: Patient declined  Transportation Needs: Unknown (07/22/2024)   PRAPARE - Administrator, Civil Service (Medical): Not on file    Lack of Transportation (Non-Medical): Patient declined  Physical Activity: Unknown (03/09/2024)   Exercise Vital Sign    Days of Exercise per Week: Patient declined    Minutes of Exercise per Session: Not on file  Stress: Patient Declined (07/22/2024)   [employer] of Occupational Health - Occupational Stress Questionnaire    Feeling of Stress: Patient declined  Social Connections: Unknown (07/22/2024)   Social Connection and Isolation Panel    Frequency of Communication with Friends and Family: Patient declined    Frequency of Social Gatherings with Friends and Family: Patient declined    Attends Religious Services: Patient declined    Database administrator or Organizations: Yes    Attends Banker Meetings: Patient  declined    Marital Status: Widowed  Catering manager Violence: Not on file    Family History  Problem Relation Age of Onset   Heart disease Mother    Breast cancer Mother    CAD Mother        stents emergently in her 43s   Lung cancer Father        smoker, mets to brain   Stroke Maternal Grandmother    Polycystic kidney disease Paternal Uncle    Kidney failure Paternal Uncle    Heart attack Sister 46       LAD    Anuerysm Sister     Current Outpatient Medications on File Prior to Visit  Medication Sig Dispense Refill   Ascorbic Acid (VITAMIN C PO) Take 1,000 mg by mouth daily.      aspirin 81 MG tablet Take 81 mg by mouth daily.     atenolol  (TENORMIN ) 50 MG tablet TAKE ONE TABLET BY MOUTH EVERY DAY FOR BLOOD PRESSURE 90 tablet 2   azithromycin  (ZITHROMAX  Z-PAK) 250 MG tablet As directed 6 each 0   benzonatate  (TESSALON ) 200 MG capsule Take  1 perle  3 x /day for  Cough 90 capsule 2   Cholecalciferol (VITAMIN D ) 2000 UNITS tablet Take 6,000 Units by mouth daily.      esomeprazole  (NEXIUM ) 40 MG capsule Take 1 capsule (40 mg total) by mouth daily before breakfast. TAKE ONE CAPSULE BY MOUTH DAILY TO prevent acid reflux AND hoarseness 90 capsule 2   fexofenadine (ALLEGRA ALLERGY) 180 MG tablet Take 180 mg by mouth daily.     MAGNESIUM  OXIDE PO Take 400 mg by mouth daily.     meclizine  (ANTIVERT ) 25 MG tablet TAKE 1/2-1 TABLET BY MOUTH 2-3 TIMES DAILY AS NEEDED FOR dizziness/vertigo 270 tablet 3   Melatonin 10 MG CAPS Take by mouth.     Multiple Vitamin (MULTIVITAMIN) tablet Take 1 tablet by mouth daily.     multivitamin-lutein (OCUVITE-LUTEIN) CAPS capsule Take 1 capsule by mouth daily.     Omega-3 Fatty Acids (FISH OIL) 1000 MG CAPS Take 1 capsule by mouth daily.     OVER THE COUNTER MEDICATION Takes Vitamin B Complex 1 daily     rosuvastatin  (CRESTOR ) 10 MG tablet TAKE ONE TABLET BY MOUTH DAILY FOR cholesterol 90 tablet 3   traZODone  (DESYREL ) 50 MG tablet Take 1.5 tablets (75 mg total) by mouth at bedtime. (Patient taking differently: Take 75 mg by mouth 2 (two) times daily.) 150 tablet 2   TURMERIC PO Take 1,500 mg by mouth.     Zinc 50 MG TABS Take 50 mg by mouth daily.     No current facility-administered medications on file prior to visit.    Allergies  Allergen Reactions   Penicillins Anaphylaxis   Codeine Nausea And Vomiting   Sudafed [Pseudoephedrine Hcl]     Makes heart race, per pt         Physical Exam Vitals requested from patient and listed below if patient had equipment and was able to obtain at home for this virtual visit: There were no vitals filed for this visit. Estimated body mass index is 27.54 kg/m as calculated from the following:   Height as of 03/10/24: 5' 0.5 (1.537 m).   Weight as of this encounter: 143 lb 6.4 oz (65 kg).  EKG (optional): deferred due to virtual visit  GENERAL: alert, oriented, no acute distress detected, full vision exam deferred due to pandemic and/or virtual encounter  HEENT: atraumatic, conjunttiva clear, no obvious abnormalities on inspection of external nose and ears  NECK: normal movements of the head and neck  LUNGS: on inspection no signs of respiratory distress, breathing rate appears normal, no obvious gross SOB, gasping or wheezing  CV: no obvious cyanosis  MS: moves all visible extremities without noticeable abnormality  PSYCH/NEURO: pleasant and cooperative, no obvious depression or anxiety, speech and thought processing grossly intact, Cognitive function grossly intact  Flowsheet Row Clinical Support from 07/23/2024 in [facility]  PHQ-9 Total Score 0        07/23/2024   12:10 PM 07/23/2023    1:16 AM 04/19/2023    9:50 AM 01/16/2023    8:21 PM 10/15/2022   10:37 AM  Depression screen PHQ 2/9  Decreased Interest 0 0 0 0 0  Down, Depressed, Hopeless 0 0 0 0 0  PHQ - 2 Score 0 0 0 0 0  Altered sleeping 0      Tired, decreased energy 0      Change in appetite 0      Feeling bad or failure about yourself  0      Trouble concentrating 0      Moving slowly or fidgety/restless 0      Suicidal thoughts 0      PHQ-9 Score 0      Difficult doing work/chores Not difficult at all           01/16/2023    8:20 PM 04/19/2023    9:50 AM 07/23/2023    1:16 AM 07/22/2024    4:05 PM 07/23/2024   11:26 AM  Fall Risk  Falls in the past year? 0 0 0 0 0  Was there an injury with Fall? 0 0    0  Fall Risk Category Calculator  0   0  Patient at Risk for Falls Due to No Fall Risks No Fall Risks No Fall Risks  No Fall Risks  Fall risk Follow up  Falls evaluation completed;Falls prevention discussed Falls prevention discussed;Education provided;Falls evaluation completed  Falls evaluation completed     SUMMARY AND PLAN:  Encounter for Medicare annual wellness exam    Discussed applicable health maintenance/preventive health measures and advised and referred or ordered per patient preferences: -discussed vaccines due recs and risks, she declined most, she is considering the Tdap and pneumococcal vaccines -she had her bone density test, discussed bone building exercise and adequate vit D and calcium   Health Maintenance  Topic Date Due   Zoster Vaccines- Shingrix (1 of 2) 05/24/1965   Pneumococcal Vaccine: 50+ Years (3 of 3 - PCV20 or PCV21) 10/05/2019   DTaP/Tdap/Td (4 - Td or Tdap) 09/13/2023   COVID-19 Vaccine (1) 08/08/2024 (Originally 05/25/1951)   Influenza Vaccine  02/09/2025 (Originally 06/12/2024)   Medicare Annual Wellness (AWV)  07/23/2025   DEXA SCAN  Completed   Hepatitis C Screening  Completed   HPV VACCINES  Aged Out   Meningococcal B Vaccine  Aged Out   Mammogram  Discontinued   Colonoscopy  Discontinued   Fecal DNA (Cologuard)  Discontinued      Education and counseling on the following was provided based on the above review of health and a plan/checklist for the patient, along with additional information discussed, was provided for the patient in the patient instructions :   -Advised and counseled on a healthy lifestyle - including the importance of a healthy diet, regular physical activity -Reviewed patient's current diet.  Advised and counseled on a whole foods based healthy diet. A summary of a healthy diet was provided in the Patient Instructions.  -reviewed patient's current physical activity level and discussed exercise guidelines for adults.  Discussed community resources and ideas for safe exercise at home to assist in meeting exercise guideline recommendations in a safe and healthy way.  -Advise yearly dental visits at minimum and regular eye exams   Follow up: see patient instructions     Patient Instructions  I really enjoyed getting to talk with you today! I am available on Tuesdays and Thursdays for virtual visits if you have any questions or concerns, or if I can be of any further assistance.   CHECKLIST FROM ANNUAL WELLNESS VISIT:  -Follow up (please call to schedule if not scheduled after visit):   -yearly for annual wellness visit with primary care office  Here is a list of your preventive care/health maintenance measures and the plan for each if any are due:  PLAN For any measures below that may be due:    1. If you wish to get any of the vaccines, you can do so at the pharmacy, please let us  know if you do so that we can update your record.   Health Maintenance  Topic Date Due   Zoster Vaccines- Shingrix (1 of 2) 05/24/1965   Pneumococcal Vaccine: 50+ Years (3 of 3 - PCV20 or PCV21) 10/05/2019   DTaP/Tdap/Td (4 - Td or Tdap) 09/13/2023   COVID-19 Vaccine (1) Declined by patient   Influenza Vaccine  Declined by patient   Medicare Annual Wellness (AWV)  07/23/2025   DEXA SCAN  Completed   Hepatitis C Screening  Completed   HPV VACCINES  Aged Out   Meningococcal B Vaccine  Aged Out   Mammogram  Discontinued   Colonoscopy  Discontinued   Fecal DNA (Cologuard)  Discontinued    -See a dentist at least yearly  -Get your eyes checked and then per your eye specialist's recommendations  -Other issues addressed today:   -I have included below further information regarding a healthy whole foods based diet, physical activity guidelines for adults, stress management and opportunities for social connections. I hope you find this information useful.    -----------------------------------------------------------------------------------------------------------------------------------------------------------------------------------------------------------------------------------------------------------    NUTRITION: -eat real food: lots of colorful vegetables (half the plate) and fruits -5-7 servings of vegetables and fruits per day (fresh or steamed is best), exp. 2 servings of vegetables with lunch and dinner and 2 servings of fruit per day. Berries and greens such as kale and collards are great choices.  -consume on a regular basis:  fresh fruits, fresh veggies, fish, nuts, seeds, healthy oils (such as olive oil, avocado oil), whole grains (make sure for bread/pasta/crackers/etc., that the first ingredient on label contains the word whole), legumes. -can eat small amounts of dairy and lean meat (no larger than the palm of your hand), but avoid processed meats such as ham, bacon, lunch meat, etc. -drink water -try to avoid fast food and pre-packaged foods, processed meat, ultra processed foods/beverages (donuts, candy, etc.) -most experts advise limiting sodium to < 2300mg  per day, should limit further is any chronic conditions such as high blood pressure, heart disease, diabetes, etc. The American Heart Association advised that < 1500mg  is is ideal -try to avoid foods/beverages that contain any ingredients with names you do not recognize  -try to avoid foods/beverages  with added sugar or sweeteners/sweets  -try to avoid sweet drinks (including diet drinks): soda, juice,  Gatorade, sweet tea, power drinks, diet drinks -try to avoid white rice, white bread, pasta (unless whole grain)  EXERCISE GUIDELINES FOR ADULTS: -if you wish to increase your physical activity, do so gradually and with the approval of your doctor -STOP and seek medical care immediately if you have any chest pain, chest discomfort or trouble breathing when starting or  increasing exercise  -move and stretch your body, legs, feet and arms when sitting for long periods -Physical activity guidelines for optimal health in adults: -get at least 150 minutes per week of moderate exercise (can talk, but not sing); this is about 20-30 minutes of sustained activity 5-7 days per week or two 10-15 minute episodes of sustained activity 5-7 days per week -do some muscle building/resistance training/strength training at least 2 days per week  -balance exercises 3+ days per week:   Stand somewhere where you have something sturdy to hold onto if you lose balance    1) lift up on toes, then back down, start with 5x per day and work up to 20x   2) stand and lift one leg straight out to the side so that foot is a few inches of the floor, start with 5x each side and work up to 20x each side   3) stand on one foot, start with 5 seconds each side and work up to 20 seconds on each side  If you need ideas or help with getting more active:  -Silver sneakers https://tools.silversneakers.com  -Walk with a Doc: http://www.duncan-williams.com/  -try to include resistance (weight lifting/strength building) and balance exercises twice per week: or the following link for ideas: http://castillo-powell.com/  BuyDucts.dk  STRESS MANAGEMENT: -can try meditating, or just sitting quietly with deep breathing while intentionally relaxing all parts of your body for 5 minutes daily -if you need further help with stress, anxiety or depression please follow up with your primary doctor or contact the wonderful folks at [facility]: [phone]  SOCIAL CONNECTIONS: -options in [place] if you wish to engage in more social and exercise related activities:  -Silver sneakers https://tools.silversneakers.com  -Walk with a Doc: http://www.duncan-williams.com/  -Check out the [facility] Active Adults 50+  section on the [employer] (hiking clubs, book clubs, cards and games, chess, exercise classes, aquatic classes and much more) - see the website for details: https://www.[place].gov/departments/parks-recreation/active-adults50  -YouTube has lots of exercise videos for different ages and abilities as well  -[facility] (a variety of indoor and outdoor inperson activities for adults). [phone]. [address]  -Virtual Online Classes (a variety of topics): see seniorplanet.org or call (231)637-2933  -consider volunteering at a school, hospice center, church, senior center or elsewhere            Chiquita JONELLE Cramp, DO

## 2024-07-23 NOTE — Patient Instructions (Signed)
 I really enjoyed getting to talk with you today! I am available on Tuesdays and Thursdays for virtual visits if you have any questions or concerns, or if I can be of any further assistance.   CHECKLIST FROM ANNUAL WELLNESS VISIT:  -Follow up (please call to schedule if not scheduled after visit):   -yearly for annual wellness visit with primary care office  Here is a list of your preventive care/health maintenance measures and the plan for each if any are due:  PLAN For any measures below that may be due:    1. If you wish to get any of the vaccines, you can do so at the pharmacy, please let us  know if you do so that we can update your record.   Health Maintenance  Topic Date Due   Zoster Vaccines- Shingrix (1 of 2) 05/24/1965   Pneumococcal Vaccine: 50+ Years (3 of 3 - PCV20 or PCV21) 10/05/2019   DTaP/Tdap/Td (4 - Td or Tdap) 09/13/2023   COVID-19 Vaccine (1) Declined by patient   Influenza Vaccine  Declined by patient   Medicare Annual Wellness (AWV)  07/23/2025   DEXA SCAN  Completed   Hepatitis C Screening  Completed   HPV VACCINES  Aged Out   Meningococcal B Vaccine  Aged Out   Mammogram  Discontinued   Colonoscopy  Discontinued   Fecal DNA (Cologuard)  Discontinued    -See a dentist at least yearly  -Get your eyes checked and then per your eye specialist's recommendations  -Other issues addressed today:   -I have included below further information regarding a healthy whole foods based diet, physical activity guidelines for adults, stress management and opportunities for social connections. I hope you find this information useful.   -----------------------------------------------------------------------------------------------------------------------------------------------------------------------------------------------------------------------------------------------------------    NUTRITION: -eat real food: lots of colorful vegetables (half the plate) and  fruits -5-7 servings of vegetables and fruits per day (fresh or steamed is best), exp. 2 servings of vegetables with lunch and dinner and 2 servings of fruit per day. Berries and greens such as kale and collards are great choices.  -consume on a regular basis:  fresh fruits, fresh veggies, fish, nuts, seeds, healthy oils (such as olive oil, avocado oil), whole grains (make sure for bread/pasta/crackers/etc., that the first ingredient on label contains the word whole), legumes. -can eat small amounts of dairy and lean meat (no larger than the palm of your hand), but avoid processed meats such as ham, bacon, lunch meat, etc. -drink water -try to avoid fast food and pre-packaged foods, processed meat, ultra processed foods/beverages (donuts, candy, etc.) -most experts advise limiting sodium to < 2300mg  per day, should limit further is any chronic conditions such as high blood pressure, heart disease, diabetes, etc. The American Heart Association advised that < 1500mg  is is ideal -try to avoid foods/beverages that contain any ingredients with names you do not recognize  -try to avoid foods/beverages  with added sugar or sweeteners/sweets  -try to avoid sweet drinks (including diet drinks): soda, juice, Gatorade, sweet tea, power drinks, diet drinks -try to avoid white rice, white bread, pasta (unless whole grain)  EXERCISE GUIDELINES FOR ADULTS: -if you wish to increase your physical activity, do so gradually and with the approval of your doctor -STOP and seek medical care immediately if you have any chest pain, chest discomfort or trouble breathing when starting or increasing exercise  -move and stretch your body, legs, feet and arms when sitting for long periods -Physical activity guidelines for optimal health  in adults: -get at least 150 minutes per week of moderate exercise (can talk, but not sing); this is about 20-30 minutes of sustained activity 5-7 days per week or two 10-15 minute episodes of  sustained activity 5-7 days per week -do some muscle building/resistance training/strength training at least 2 days per week  -balance exercises 3+ days per week:   Stand somewhere where you have something sturdy to hold onto if you lose balance    1) lift up on toes, then back down, start with 5x per day and work up to 20x   2) stand and lift one leg straight out to the side so that foot is a few inches of the floor, start with 5x each side and work up to 20x each side   3) stand on one foot, start with 5 seconds each side and work up to 20 seconds on each side  If you need ideas or help with getting more active:  -Silver sneakers https://tools.silversneakers.com  -Walk with a Doc: http://www.duncan-williams.com/  -try to include resistance (weight lifting/strength building) and balance exercises twice per week: or the following link for ideas: http://castillo-powell.com/  BuyDucts.dk  STRESS MANAGEMENT: -can try meditating, or just sitting quietly with deep breathing while intentionally relaxing all parts of your body for 5 minutes daily -if you need further help with stress, anxiety or depression please follow up with your primary doctor or contact the wonderful folks at [facility]: [phone]  SOCIAL CONNECTIONS: -options in [place] if you wish to engage in more social and exercise related activities:  -Silver sneakers https://tools.silversneakers.com  -Walk with a Doc: http://www.duncan-williams.com/  -Check out the [facility] Active Adults 50+ section on the [place] [employer] (hiking clubs, book clubs, cards and games, chess, exercise classes, aquatic classes and much more) - see the website for details: https://www.[place].gov/departments/parks-recreation/active-adults50  -YouTube has lots of exercise videos for different ages and abilities as well  -[facility] (a variety of indoor and outdoor inperson activities for adults). [phone]. [address]  -Virtual Online Classes (a variety of topics): see seniorplanet.org or call [phone]  -consider volunteering at a school, hospice center, church, senior center or elsewhere

## 2024-08-10 ENCOUNTER — Ambulatory Visit: Admitting: Family Medicine

## 2024-08-10 ENCOUNTER — Encounter: Payer: Self-pay | Admitting: Family Medicine

## 2024-08-10 VITALS — BP 156/80 | HR 54 | Temp 98.1°F | Resp 16 | Ht 60.5 in | Wt 146.0 lb

## 2024-08-10 DIAGNOSIS — N182 Chronic kidney disease, stage 2 (mild): Secondary | ICD-10-CM

## 2024-08-10 DIAGNOSIS — I10 Essential (primary) hypertension: Secondary | ICD-10-CM | POA: Diagnosis not present

## 2024-08-10 DIAGNOSIS — R202 Paresthesia of skin: Secondary | ICD-10-CM | POA: Diagnosis not present

## 2024-08-10 DIAGNOSIS — Z0000 Encounter for general adult medical examination without abnormal findings: Secondary | ICD-10-CM

## 2024-08-10 DIAGNOSIS — E559 Vitamin D deficiency, unspecified: Secondary | ICD-10-CM | POA: Diagnosis not present

## 2024-08-10 DIAGNOSIS — F5101 Primary insomnia: Secondary | ICD-10-CM

## 2024-08-10 DIAGNOSIS — E782 Mixed hyperlipidemia: Secondary | ICD-10-CM

## 2024-08-10 LAB — BASIC METABOLIC PANEL WITH GFR
BUN: 12 mg/dL (ref 6–23)
CO2: 28 meq/L (ref 19–32)
Calcium: 9.7 mg/dL (ref 8.4–10.5)
Chloride: 102 meq/L (ref 96–112)
Creatinine, Ser: 0.76 mg/dL (ref 0.40–1.20)
GFR: 75.16 mL/min (ref >60.00)
Glucose, Bld: 94 mg/dL (ref 70–99)
Potassium: 3.9 meq/L (ref 3.5–5.1)
Sodium: 138 meq/L (ref 135–145)

## 2024-08-10 LAB — CBC
HCT: 42.1 % (ref 36.0–46.0)
Hemoglobin: 14.1 g/dL (ref 12.0–15.0)
MCHC: 33.4 g/dL (ref 30.0–36.0)
MCV: 89.3 fl (ref 78.0–100.0)
Platelets: 279 K/uL (ref 150.0–400.0)
RBC: 4.72 Mil/uL (ref 3.87–5.11)
RDW: 13.6 % (ref 11.5–15.5)
WBC: 5.6 K/uL (ref 4.0–10.5)

## 2024-08-10 LAB — MICROALBUMIN / CREATININE URINE RATIO
Creatinine,U: 12.5 mg/dL
Microalb Creat Ratio: UNDETERMINED mg/g (ref 0.0–30.0)
Microalb, Ur: <0.7 mg/dL

## 2024-08-10 NOTE — Progress Notes (Unsigned)
 HPI: [name] is a 78 y.o. female, who is here today for her routine physical.  Last CPE: ***  Regular exercise 3 or more time per week: *** Following a healthy diet: ***  Chronic medical problems: *** Discussed the use of AI scribe software for clinical note transcription with the patient, who gave verbal consent to proceed.  History of Present Illness Jean Drake is a 78 year old female with hypertension and osteopenia who presents for a routine physical exam.  She has hypertension and is currently taking atenolol . She does not check her blood pressure at home due to a broken device. Her last recorded blood pressure was 124/70.  She has a history of osteopenia and takes calcium  and vitamin D  supplements. She takes 6000 units of vitamin D  daily due to previously low levels and drinks milk occasionally to supplement her calcium  intake. Her last bone density scan was in April 2025.  She experiences difficulty sleeping, often taking a long time to fall asleep despite medication. She takes two trazodone  tablets at night, along with 10 mg of melatonin, but still struggles to sleep. She uses a fan for white noise, which helps her sleep. She sleeps an average of six to seven hours per night, though she wakes up frequently to use the bathroom.  She reports a tingling sensation and tightness in her legs, primarily at night, occurring for about a month.  She denies alcohol and tobacco use. She cooks at home and eats vegetables most days. Her last mammogram was in December, and she regularly sees an eye doctor and dentist.   Immunization History  Administered Date(s) Administered   DTaP 09/12/2013   INFLUENZA, HIGH DOSE SEASONAL PF 10/04/2014, 08/30/2017   Influenza Split 09/12/2013   Pneumococcal Conjugate-13 10/04/2014   Pneumococcal Polysaccharide-23 03/29/2009   Td 12/20/2003   Tdap 09/12/2013   Zoster, Live 06/07/2010   Health Maintenance  Topic Date Due   COVID-19  Vaccine (1) 08/26/2024 (Originally 05/25/1951)   Zoster Vaccines- Shingrix (1 of 2) 11/09/2024 (Originally [birth year]/1965)   Influenza Vaccine  02/09/2025 (Originally 06/12/2024)   DTaP/Tdap/Td (4 - Td or Tdap) 08/10/2025 (Originally 09/13/2023)   Pneumococcal Vaccine: 50+ Years (3 of 3 - PCV20 or PCV21) 08/10/2025 (Originally 10/05/2019)   Medicare Annual Wellness (AWV)  07/23/2025   DEXA SCAN  Completed   Hepatitis C Screening  Completed   HPV VACCINES  Aged Out   Meningococcal B Vaccine  Aged Out   Mammogram  Discontinued   Colonoscopy  Discontinued   Fecal DNA (Cologuard)  Discontinued   Hyperlipidemia: Currently on rosuvastatin  10 mg daily.  Lab Results  Component Value Date   CHOL 134 10/23/2023   HDL 73 10/23/2023   LDLCALC 47 10/23/2023   TRIG 61 10/23/2023   CHOLHDL 1.8 10/23/2023   Hypertension:*** Lab Results  Component Value Date   NA 139 10/23/2023   CL 103 10/23/2023   K 4.2 10/23/2023   CO2 27 10/23/2023   BUN 15 10/23/2023   CREATININE 0.78 10/23/2023   EGFR 78 10/23/2023   CALCIUM  9.6 10/23/2023   ALBUMIN 4.3 05/10/2017   GLUCOSE 92 10/23/2023   GERD: On Nexium  40 mg daily before breakfast. Insomnia: Currently on trazodone  50 mg 1-1/2 tablet at bedtime.  She has *** concerns today.  Review of Systems  Current Outpatient Medications on File Prior to Visit  Medication Sig Dispense Refill   Ascorbic Acid (VITAMIN C PO) Take 1,000 mg by mouth daily.  aspirin 81 MG tablet Take 81 mg by mouth daily.     atenolol  (TENORMIN ) 50 MG tablet TAKE ONE TABLET BY MOUTH EVERY DAY FOR BLOOD PRESSURE 90 tablet 2   Cholecalciferol (VITAMIN D ) 2000 UNITS tablet Take 6,000 Units by mouth daily.      esomeprazole  (NEXIUM ) 40 MG capsule Take 1 capsule (40 mg total) by mouth daily before breakfast. TAKE ONE CAPSULE BY MOUTH DAILY TO prevent acid reflux AND hoarseness 90 capsule 2   fexofenadine (ALLEGRA ALLERGY) 180 MG tablet Take 180 mg by mouth daily.     MAGNESIUM  OXIDE PO  Take 400 mg by mouth daily.     meclizine  (ANTIVERT ) 25 MG tablet TAKE 1/2-1 TABLET BY MOUTH 2-3 TIMES DAILY AS NEEDED FOR dizziness/vertigo 270 tablet 3   Melatonin 10 MG CAPS Take by mouth.     Multiple Vitamin (MULTIVITAMIN) tablet Take 1 tablet by mouth daily.     multivitamin-lutein (OCUVITE-LUTEIN) CAPS capsule Take 1 capsule by mouth daily.     Omega-3 Fatty Acids (FISH OIL) 1000 MG CAPS Take 1 capsule by mouth daily.     OVER THE COUNTER MEDICATION Takes Vitamin B Complex 1 daily     rosuvastatin  (CRESTOR ) 10 MG tablet TAKE ONE TABLET BY MOUTH DAILY FOR cholesterol 90 tablet 3   traZODone  (DESYREL ) 50 MG tablet Take 1.5 tablets (75 mg total) by mouth at bedtime. (Patient taking differently: Take 75 mg by mouth 2 (two) times daily.) 150 tablet 2   TURMERIC PO Take 1,500 mg by mouth.     Zinc 50 MG TABS Take 50 mg by mouth daily.     No current facility-administered medications on file prior to visit.    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   GERD 10/20/2013   Hyperlipidemia 10/20/2013   Migraine, unspecified, without mention of intractable migraine without mention of status migrainosus 10/20/2013   Morbid obesity (HCC) 11/18/2014   Prediabetes 10/20/2013   SVT (supraventricular tachycardia) 11/15/2015   Vitamin D  deficiency 10/20/2013    Past Surgical History:  Procedure Laterality Date   NO PAST SURGERIES      Allergies  Allergen Reactions   Penicillins Anaphylaxis   Codeine Nausea And Vomiting   Sudafed [Pseudoephedrine Hcl]     Makes heart race, per pt     Family History  Problem Relation Age of Onset   Heart disease Mother    Breast cancer Mother    CAD Mother        stents emergently in her 73s   Lung cancer Father        smoker, mets to brain   Stroke Maternal Grandmother    Polycystic kidney disease Paternal Uncle    Kidney failure Paternal Uncle    Heart attack Sister 47       LAD   Anuerysm  Sister     Social History   Socioeconomic History   Marital status: Widowed    Spouse name: Not on file   Number of children: Not on file   Years of education: Not on file   Highest education level: Not on file  Occupational History   Not on file  Tobacco Use   Smoking status: Never   Smokeless tobacco: Never  Substance and Sexual Activity   Alcohol use: No   Drug use: Not on file   Sexual activity: Not on file  Other Topics Concern   Not on file  Social History Narrative   Not on file   Social Drivers of Health   Financial Resource Strain: Patient Declined (07/22/2024)   Overall Financial Resource Strain (CARDIA)    Difficulty of Paying Living Expenses: Patient declined  Food Insecurity: Patient Declined (07/22/2024)   Hunger Vital Sign    Worried About Running Out of Food in the Last Year: Patient declined    Ran Out of Food in the Last Year: Patient declined  Transportation Needs: Unknown (07/22/2024)   PRAPARE - Administrator, Civil Service (Medical): Not on file    Lack of Transportation (Non-Medical): Patient declined  Physical Activity: Unknown (03/09/2024)   Exercise Vital Sign    Days of Exercise per Week: Patient declined    Minutes of Exercise per Session: Not on file  Stress: Patient Declined (07/22/2024)   [employer] of Occupational Health - Occupational Stress Questionnaire    Feeling of Stress: Patient declined  Social Connections: Unknown (07/22/2024)   Social Connection and Isolation Panel    Frequency of Communication with Friends and Family: Patient declined    Frequency of Social Gatherings with Friends and Family: Patient declined    Attends Religious Services: Patient declined    Database administrator or Organizations: Yes    Attends Banker Meetings: Patient declined    Marital Status: Widowed    Vitals:   08/10/24 1114  BP: (!) 156/80  Pulse: (!) 54  Temp: 98.1 F (36.7 C)  SpO2: 97%   Body mass index is  28.04 kg/m.  Wt Readings from Last 3 Encounters:  08/10/24 146 lb (66.2 kg)  07/23/24 143 lb 6.4 oz (65 kg)  07/06/24 145 lb (65.8 kg)    Physical Exam Vitals and nursing note reviewed.  Constitutional:      General: She is not in acute distress.    Appearance: She is well-developed.  HENT:     Head: Normocephalic and atraumatic.     Right Ear: Hearing, tympanic membrane, ear canal and external ear normal.     Left Ear: Hearing, tympanic membrane, ear canal and external ear normal.     Mouth/Throat:     Mouth: Mucous membranes are moist.     Pharynx: Oropharynx is clear. Uvula midline.  Eyes:     Extraocular Movements: Extraocular movements intact.     Conjunctiva/sclera: Conjunctivae normal.     Pupils: Pupils are equal, round, and reactive to light.  Neck:     Thyroid : No thyromegaly.     Trachea: No tracheal deviation.  Cardiovascular:     Rate and Rhythm: Normal rate and regular rhythm.     Pulses:          Dorsalis pedis pulses are 2+ on the right side and 2+ on the left side.       Posterior tibial pulses are 2+ on the right side and 2+ on the left side.     Heart sounds: No murmur heard.    Comments: HR 64/min Pulmonary:     Effort: Pulmonary effort is normal. No respiratory distress.     Breath sounds: Normal breath sounds.  Abdominal:     Palpations: Abdomen is soft. There is no hepatomegaly or mass.     Tenderness: There is no abdominal tenderness.  Musculoskeletal:     Comments: No major deformity or signs of synovitis appreciated.  Lymphadenopathy:     Cervical: No cervical adenopathy.     Upper Body:     Right  upper body: No supraclavicular adenopathy.     Left upper body: No supraclavicular adenopathy.  Skin:    General: Skin is warm.     Findings: No erythema or rash.  Neurological:     General: No focal deficit present.     Mental Status: She is alert and oriented to person, place, and time.     Cranial Nerves: No cranial nerve deficit.      Coordination: Coordination normal.     Gait: Gait normal.     Deep Tendon Reflexes:     Reflex Scores:      Bicep reflexes are 2+ on the right side and 2+ on the left side.      Patellar reflexes are 2+ on the right side and 2+ on the left side. Psychiatric:     Comments: Well groomed, good eye contact.     ASSESSMENT AND PLAN: Ms. Jean Drake was here today annual physical examination.  Orders Placed This Encounter  Procedures   Basic metabolic panel with GFR   VITAMIN D  25 Hydroxy (Vit-D Deficiency, Fractures)   Microalbumin / creatinine urine ratio   Vitamin B12   TSH   CBC    Jean Drake was seen today for annual exam.  Diagnoses and all orders for this visit:  Routine general medical examination at a health care facility  Essential hypertension  CKD (chronic kidney disease) stage 2, GFR 60-89 ml/min -     Basic metabolic panel with GFR; Future -     Microalbumin / creatinine urine ratio; Future -     CBC; Future  Hyperlipidemia, mixed  Vitamin D  deficiency, unspecified -     VITAMIN D  25 Hydroxy (Vit-D Deficiency, Fractures); Future  Tingling in extremities -     Vitamin B12; Future -     TSH; Future    Routine general medical examination at a health care facility  Essential hypertension  CKD (chronic kidney disease) stage 2, GFR 60-89 ml/min -     Basic metabolic panel with GFR; Future -     Microalbumin / creatinine urine ratio; Future -     CBC; Future  Hyperlipidemia, mixed  Vitamin D  deficiency, unspecified -     VITAMIN D  25 Hydroxy (Vit-D Deficiency, Fractures); Future  Tingling in extremities -     Vitamin B12; Future -     TSH; Future    Return in 6 months (on 02/07/2025) for chronic problems.  Jean Maybee G. [place], MD  [facility]. [place] office.

## 2024-08-10 NOTE — Patient Instructions (Addendum)
 A few things to remember from today's visit:  Routine general medical examination at a health care facility  Essential hypertension  CKD (chronic kidney disease) stage 2, GFR 60-89 ml/min - Plan: Basic metabolic panel with GFR, Microalbumin / creatinine urine ratio, CBC  Hyperlipidemia, mixed  Vitamin D  deficiency, unspecified - Plan: VITAMIN D  25 Hydroxy (Vit-D Deficiency, Fractures)  Tingling in extremities - Plan: Vitamin B12, TSH  Monitor blood pressure and heart rate at home. No changes today.  If you need refills for medications you take chronically, please call your pharmacy. Do not use My Chart to request refills or for acute issues that need immediate attention. If you send a my chart message, it may take a few days to be addressed, specially if I am not in the office.  Please be sure medication list is accurate. If a new problem present, please set up appointment sooner than planned today.

## 2024-08-11 LAB — VITAMIN B12: Vitamin B-12: 638 pg/mL (ref 211–911)

## 2024-08-11 LAB — TSH: TSH: 1.03 u[IU]/mL (ref 0.35–5.50)

## 2024-08-11 LAB — VITAMIN D 25 HYDROXY (VIT D DEFICIENCY, FRACTURES): VITD: 89.37 ng/mL (ref 30.00–100.00)

## 2024-08-13 ENCOUNTER — Ambulatory Visit: Payer: Self-pay | Admitting: Family Medicine

## 2024-08-13 NOTE — Assessment & Plan Note (Signed)
 BP mildly elevated today. She has not been monitoring BP lately but when she did BP's lower. Continue Atenolol  50 mg daily, instructed to monitor BP and HR regularly. Continue low salt diet. F/U in 6 months, before if needed.

## 2024-08-13 NOTE — Assessment & Plan Note (Signed)
 Cr 0.7 and e GFR 78 in 10/2023. Continue low salt diet, adequate hydration,and avoidance of NSAID's.

## 2024-08-13 NOTE — Assessment & Plan Note (Signed)
 Continue same dose of vit D supplementation. Further recommendations according to 25 OH vit D result.

## 2024-08-13 NOTE — Assessment & Plan Note (Signed)
 We discussed the importance of regular physical activity and healthy diet for prevention of chronic illness and/or complications. Preventive guidelines reviewed. Vaccination up to date. Ca++ and vit D supplementation to continue.

## 2024-08-13 NOTE — Assessment & Plan Note (Signed)
 Stable, she does not feel like it is well controlled. Continue Trazodone  50 gm 1-2 tabs at bedtime and good sleep hygiene. Other options we can consider will be Mirtazapine or Doxepin. F/U in 6 months.

## 2024-08-13 NOTE — Assessment & Plan Note (Signed)
Continue Rosuvastatin 10 mg daily and low fat diet. Further recommendations according to FLP result. 

## 2024-08-29 ENCOUNTER — Other Ambulatory Visit: Payer: Self-pay | Admitting: Family Medicine

## 2024-08-29 DIAGNOSIS — F5101 Primary insomnia: Secondary | ICD-10-CM

## 2024-11-07 ENCOUNTER — Encounter: Payer: Self-pay | Admitting: Family Medicine

## 2024-11-10 ENCOUNTER — Other Ambulatory Visit: Payer: Self-pay | Admitting: Family Medicine

## 2024-11-10 DIAGNOSIS — F5101 Primary insomnia: Secondary | ICD-10-CM

## 2024-11-10 MED ORDER — TRAZODONE HCL 50 MG PO TABS: 100.0000 mg | ORAL_TABLET | Freq: Every day | ORAL | 1 refills | Status: AC

## 2024-11-19 ENCOUNTER — Ambulatory Visit: Admitting: Family Medicine

## 2024-11-19 ENCOUNTER — Encounter: Payer: Self-pay | Admitting: Family Medicine

## 2024-11-19 VITALS — BP 128/70 | HR 85 | Temp 100.2°F | Ht 60.0 in | Wt 138.0 lb

## 2024-11-19 DIAGNOSIS — J069 Acute upper respiratory infection, unspecified: Secondary | ICD-10-CM

## 2024-11-19 MED ORDER — PROMETHAZINE-DM 6.25-15 MG/5ML PO SYRP: 5.0000 mL | ORAL_SOLUTION | Freq: Four times a day (QID) | ORAL | 0 refills | Status: AC | PRN

## 2024-11-19 MED ORDER — AZITHROMYCIN 250 MG PO TABS: ORAL_TABLET | ORAL | 0 refills | Status: AC

## 2024-11-19 MED ORDER — PREDNISONE 10 MG PO TABS: ORAL_TABLET | ORAL | 0 refills | Status: AC

## 2024-11-19 NOTE — Progress Notes (Unsigned)
" ° °  Acute Office Visit   Subjective:  Patient ID: Jean Drake, female    DOB: 08-Oct-1946, 79 y.o.   MRN: [id number]  Chief Complaint  Patient presents with   Cough   Fever    X3 weeks     HPI Patient is present for the following symptoms  Non-productive cough changed to a productive cough- green, thick.  Low grade fever  Headaches  Ear ache/pressure  Nausea with vomit once  Loss of appetite  Altered taste  Body aches   Denies chest pain, shortness of breath, and sore thorat.   Symptoms started on December 28th.   She has took Ibuprofen and Tessalon  Pearles.  ROS See HPI above      Objective:   BP 128/70   Pulse 85   Temp 100.2 F (37.9 C) (Oral)   Ht 5' (1.524 m)   Wt 138 lb (62.6 kg)   SpO2 95%   BMI 26.95 kg/m  {Vitals History (Optional):23777}  Physical Exam  No results found for any visits on 11/19/24.      Assessment & Plan:  Upper respiratory tract infection, unspecified type -     Azithromycin ; Take 2 tablets on day 1, then 1 tablet daily on days 2 through 5  Dispense: 6 tablet; Refill: 0 -     predniSONE ; Take 6 tablets (60 mg total) by mouth daily with breakfast for 1 day, THEN 5 tablets (50 mg total) daily with breakfast for 1 day, THEN 4 tablets (40 mg total) daily with breakfast for 1 day, THEN 3 tablets (30 mg total) daily with breakfast for 1 day, THEN 2 tablets (20 mg total) daily with breakfast for 1 day, THEN 1 tablet (10 mg total) daily with breakfast for 1 day.  Dispense: 21 tablet; Refill: 0 -     Promethazine -DM; Take 5 mLs by mouth 4 (four) times daily as needed.  Dispense: 118 mL; Refill: 0    No follow-ups on file.  Wilford Merryfield, NP "

## 2024-11-19 NOTE — Patient Instructions (Addendum)
-  It was a pleasure to care for you today.  -Prescribed Promethazine -DM cough syrup, may take 5mL (1 teaspoon) every 6 hours as needed. Caution: Medication may cause drowsiness -Prescribed Azithromycin  Z pack -Prescribed Prednisone  10mg  tablet, 6 day taper. Please do not start medication until in the morning. Eat something with it. Please do not take additional NSAID'S (Ibuprofen, Advil, Aleve, or Naproxen) while taking medication. -Rest, hydrate. -If not improved, follow up.

## 2024-11-23 NOTE — Progress Notes (Signed)
 Jean Drake                                          MRN: [id number]   11/23/2024   The [facility] Quality Team Specialist reviewed this patient medical record for the purposes of chart review for care gap closure. The following were reviewed: chart review for care gap closure-controlling blood pressure.     [facility] Quality Team

## 2024-11-27 ENCOUNTER — Other Ambulatory Visit: Payer: Self-pay | Admitting: Nurse Practitioner

## 2024-11-27 DIAGNOSIS — E782 Mixed hyperlipidemia: Secondary | ICD-10-CM

## 2024-11-30 ENCOUNTER — Other Ambulatory Visit: Payer: Self-pay | Admitting: Family Medicine

## 2024-11-30 DIAGNOSIS — E782 Mixed hyperlipidemia: Secondary | ICD-10-CM

## 2024-12-15 ENCOUNTER — Other Ambulatory Visit: Payer: Self-pay | Admitting: Family Medicine

## 2024-12-15 DIAGNOSIS — K2100 Gastro-esophageal reflux disease with esophagitis, without bleeding: Secondary | ICD-10-CM

## 2025-02-10 ENCOUNTER — Ambulatory Visit: Admitting: Family Medicine
